# Patient Record
Sex: Male | Born: 1965
Health system: Southern US, Community
[De-identification: ages and names within clinical notes are randomized; demographics above are authoritative.]

## PROBLEM LIST (undated history)

## (undated) DIAGNOSIS — E291 Testicular hypofunction: Secondary | ICD-10-CM

## (undated) DIAGNOSIS — E119 Type 2 diabetes mellitus without complications: Secondary | ICD-10-CM

## (undated) DIAGNOSIS — J45909 Unspecified asthma, uncomplicated: Secondary | ICD-10-CM

## (undated) DIAGNOSIS — E669 Obesity, unspecified: Secondary | ICD-10-CM

## (undated) DIAGNOSIS — K760 Fatty (change of) liver, not elsewhere classified: Secondary | ICD-10-CM

## (undated) DIAGNOSIS — M109 Gout, unspecified: Secondary | ICD-10-CM

## (undated) DIAGNOSIS — I10 Essential (primary) hypertension: Secondary | ICD-10-CM

## (undated) DIAGNOSIS — E785 Hyperlipidemia, unspecified: Secondary | ICD-10-CM

## (undated) DIAGNOSIS — K219 Gastro-esophageal reflux disease without esophagitis: Secondary | ICD-10-CM

## (undated) DIAGNOSIS — I872 Venous insufficiency (chronic) (peripheral): Secondary | ICD-10-CM

## (undated) HISTORY — DX: Essential (primary) hypertension: I10

## (undated) HISTORY — DX: Obesity, unspecified: E66.9

## (undated) HISTORY — DX: Gastro-esophageal reflux disease without esophagitis: K21.9

## (undated) HISTORY — PX: KNEE ARTHROSCOPY: SHX127

## (undated) HISTORY — DX: Type 2 diabetes mellitus without complications: E11.9

## (undated) HISTORY — DX: Hyperlipidemia, unspecified: E78.5

## (undated) HISTORY — PX: ESOPHAGOGASTRODUODENOSCOPY: SHX1529

## (undated) HISTORY — DX: Venous insufficiency (chronic) (peripheral): I87.2

## (undated) HISTORY — DX: Gout, unspecified: M10.9

## (undated) HISTORY — DX: Fatty (change of) liver, not elsewhere classified: K76.0

## (undated) HISTORY — DX: Testicular hypofunction: E29.1

## (undated) HISTORY — DX: Unspecified asthma, uncomplicated: J45.909

---

## 1991-08-05 HISTORY — PX: CYSTOSCOPY: SUR368

## 1995-08-08 LAB — HM COLONOSCOPY: HM Colonoscopy: NEGATIVE

## 1999-08-05 HISTORY — PX: ANTERIOR CRUCIATE LIGAMENT REPAIR: SHX115

## 2000-11-29 ENCOUNTER — Encounter: Payer: Self-pay | Admitting: *Deleted

## 2000-11-29 ENCOUNTER — Inpatient Hospital Stay (HOSPITAL_COMMUNITY): Admission: EM | Admit: 2000-11-29 | Discharge: 2000-12-01 | Payer: Self-pay | Admitting: Emergency Medicine

## 2001-04-21 ENCOUNTER — Inpatient Hospital Stay (HOSPITAL_COMMUNITY): Admission: EM | Admit: 2001-04-21 | Discharge: 2001-04-25 | Payer: Self-pay | Admitting: *Deleted

## 2001-04-21 ENCOUNTER — Encounter: Payer: Self-pay | Admitting: *Deleted

## 2001-04-22 ENCOUNTER — Encounter: Payer: Self-pay | Admitting: *Deleted

## 2001-04-23 ENCOUNTER — Encounter: Payer: Self-pay | Admitting: *Deleted

## 2001-04-24 ENCOUNTER — Encounter: Payer: Self-pay | Admitting: *Deleted

## 2001-08-18 ENCOUNTER — Encounter: Payer: Self-pay | Admitting: Internal Medicine

## 2001-08-18 ENCOUNTER — Ambulatory Visit (HOSPITAL_COMMUNITY): Admission: RE | Admit: 2001-08-18 | Discharge: 2001-08-18 | Payer: Self-pay | Admitting: Internal Medicine

## 2001-10-12 ENCOUNTER — Ambulatory Visit (HOSPITAL_COMMUNITY): Admission: RE | Admit: 2001-10-12 | Discharge: 2001-10-12 | Payer: Self-pay | Admitting: Internal Medicine

## 2001-10-12 ENCOUNTER — Encounter: Payer: Self-pay | Admitting: Internal Medicine

## 2007-11-03 ENCOUNTER — Emergency Department (HOSPITAL_COMMUNITY): Admission: EM | Admit: 2007-11-03 | Discharge: 2007-11-03 | Payer: Self-pay | Admitting: Emergency Medicine

## 2010-12-20 NOTE — Discharge Summary (Signed)
[place]. [facility]  Patient:    Shaun [name], Shaun [name] Visit Number: [id number] MRN: [id number]          Service Type: MED Location: [phone] Attending Physician:  Glennon Hamilton Dictated by:   Abelino Derrick, P.A.C. [facility] Admit Date:  04/21/2001 Discharge Date: 04/25/2001                             Discharge Summary  HOSPITAL COURSE:  Mr. Geiger is a 45 year old male admitted with chest pain April 22, 2001. His EKG showed some nonspecific changes.  He was admitted to telemetry.  Enzymes were negative.  Cardiolite study was negative for ischemia with an EF of 63%.  Spiral CT was nondiagnostic.  Followup was recommended.  The patient did have a followup spiral CT that again was not diagnostic, and a VQ scan was recommended.  The patient did develop a fever of September 20 and as placed on Z-Pak.  VQ scan showed no pulmonary emboli but basilar opacities consistent with an infectious process.  In the interim, one of his blood cultures came back positive for staph.  He as seen in consult by Dr. Burnice Logan who felt that his was probably a contaminant.  Plan for now is to send him home on Zithromax 250 mg for 3 days.  DISCHARGE MEDICATIONS: 1. Zithromax 250 mg x 3 days. 2. Prevacid 30 mg a day. 3. Tenormin 50 mg a day. 4. Calan 180 mg a day. 5. Allopurinol 300 mg a day. 6. Zoloft 25 mg a day.  LABORATORY DATA:  Urinalysis unremarkable.  Urine culture negative.  Liver function test normal.  Amylase normal at 44.  Lipase normal at 31.  White count 7.7, hemoglobin 15.4, hematocrit 43.8, platelets 173.  D-dimer 0.22. Sodium 140, potassium 4.2, BUN 8, creatinine 0.9.  CK and troponin negative x 2.  Lipid profile shows cholesterol 145, HDL 23, LDL 49.  EKG shows sinus rhythm without acute changes.  DISPOSITION:  The patient is discharged in stable condition and will follow up with his family doctor. Dictated by:   Abelino Derrick, P.A.C. [facility] Attending  Physician:  Glennon Hamilton DD:  04/25/01 TD:  04/25/01 Job: [id number]

## 2010-12-20 NOTE — H&P (Signed)
[place]. [facility]  Patient:    TYCHO, CHERAMIE                      MRN: [id number] Adm. Date:  [id number] Disc. Date: [id number] Attending:  Nadean Corwin                         History and Physical  REDICTATION  CHIEF COMPLAINT:  Diarrhea with hematochezia and abdominal pain.  HISTORY OF PRESENT ILLNESS:  This is a pleasant, 45 year old, white male, patient of Dr. Marlowe Shores, who presented to the emergency room with his wife and family with a three-week history of diarrhea and acute history of hematochezia and abdominal pain.  Over the past two weeks he has had a viral "flu" with nausea, vomiting but mostly diarrhea.  It was actually improving but tended to wax and wane until yesterday when the cramping and pain acutely worsened.  Last night, he developed melena and this morning he had a large amount of bright red blood per rectum and clots.  He denied any other complaints, no fevers, chills, cough, cold symptoms, no vomiting, just nausea. He denied any similar episodes in the past.  He has had some mild cough productive of yellow phlegm and is a smoker.  PAST MEDICAL HISTORY:  Gout, hypertension, migraines, history of bilateral knee problems (Dr. Chaney Malling).  MEDICATIONS: 1. Allopurinol 100 mg p.o. q.d. 2. Atenolol 100 mg p.o. q.d. 3. Metoclopramide p.r.n. headache. 4. Aleve p.r.n. 5. Sulindac 200 mg p.o. b.i.d. 6. Albuterol p.r.n.  ALLERGIES:  No known drug allergies.  SOCIAL HISTORY:  Smoker, occasional drinker socially.  He works full-time with a [employer].  Married with children.  FAMILY HISTORY:  Unremarkable.  PHYSICAL EXAMINATION:  VITAL SIGNS:  Afebrile at 98.7, 96% on room air, respirations unlabored at 22, pulse 66 and regular, blood pressure 145/70.  GENERAL:  The patient is not in acute distress.  HEENT:  ______ AT, EOMI.  PERRLA.  Clear posterior pharynx.  Normal TMs.  NECK:  Supple without adenopathy,  JVD, thyromegaly, or bruits.  Full range of motion.  CHEST:  Course breath sounds with few bronchial wheezes without rhonchi or rales.  CARDIOVASCULAR:  Regular rate without murmur, gallop, or rub.  ABDOMEN:  Soft, nondistended, mild diffuse tenderness without localization. No hepatosplenomegaly or masses.  No rebound or guarding.  EXTREMITIES:  Without clubbing, cyanosis, or edema.  RECTAL:  Deferred because guaiac-positive on previous examination.  LABORATORY DATA:  White count 9.5, hemoglobin 14.9, platelets 226.  Urinalysis normal.  BMET, type and cross and lipase pending on admission.  ASSESSMENT AND PLAN:  This is a pleasant 45 year old, white male, smoker without prior gastrointestinal disorders, who presents acutely with a gastrointestinal bleeding following a probable viral gastroenteritis.  Will admit to Dr. Oneta Rack and consult Dr. Virginia Rochester for consideration of endoscopy and colonoscopy to rule out a ulceration, or lower gastrointestinal bleed.  No obvious hemorrhoids on examination. 1. Gastrointestinal bleed:  Will consult Dr. Virginia Rochester as described above.  Will    place the patient on intravenous Protonix and p.r.n. Phenergan.  Will    follow b.i.d. CBCs to see if his hemoglobin drops and check type and    cross and keep fluids going.  Make sure he has two IVs in case he bleeds    out further.  Likely the hemoglobin so far have been just delusional.    There has not been  a significant bleed at this point.  Hopefully, this    will all be related to a viral gastroenteritis or an ulceration due to    his history of smoking and the NSAID use for his knee pain.  Will obtain    an abdominal CT with contrast to rule out diverticular disease or abscess    in light of his complaints as well. 2. Abdominal pain as above. 3. Question asthma versus bronchitis.  As a smoker he may have chronic    obstructive pulmonary disease or some mild asthma.  Will obtain a    chest x-ray and evaluate to  see if he has any long-standing disease or    a acute bronchitis.  P.R.N. albuterol. 4. Hypertension:  Stable.  Continue his current medications.  Question use of    atenolol if he has a history of asthma or has developed asthma. 5. Migraines:  Stable. 6. Gout:  Stable. DD:  03/13/01 TD:  03/14/01 Job: [id number]

## 2010-12-20 NOTE — Procedures (Signed)
[place]. [facility]  Patient:    Shaun [name], Shaun [name]                      MRN: [id number] Adm. Date:  [id number] Attending:  Ammie Dalton CC:         Marinus Maw, M.D.   Procedure Report  PROCEDURE:  Upper endoscopy.  INDICATIONS:  This 45 year old gentleman was admitted with two weeks of diarrhea, nausea, upper respiratory infection and passing melenic stools; the hemoglobin, however, was 14 g but stool was Hemoccult-positive.  He has been on NSAIDs for gout.  He is now undergoing upper endoscopy for evaluation of upper GI bleed.  ENDOSCOPIST:  Hedwig Morton. Juanda Chance, M.D.  ENDOSCOPE:  Fujinon single-channel videoscope.  SEDATION:  Versed 10 mg IV, Fentanyl 50 mcg IV.  FINDINGS:  Fujinon single-channel videoscope passed under direct vision through the posterior pharynx into esophagus.  Patient was monitored by pulse oximetry; his oxygen saturations were normal.  Patient was very anxious and combative throughout the procedure.  Gag reflex was present.  Proximal and midesophagus were coated with dyclocaine spray.  In the distal esophagus at the GE junction, after irrigation with water, there were several linear erosions.  None of them were longer than 5 mm.  These changes were consistent with grade 1 reflux esophagitis.  There was no evidence of stricture or hiatal hernia.  Stomach:  Stomach was insufflated with air.  It was free of any blood or food. Gastric folds were normal in the body of the stomach.  In the gastric antrum, there were multiple shallow erosions with mucosal hemorrhages.  No exudate and no deep ulcers.  Pyloric outlet was normal.  Retroflexion of endoscope revealed normal fundus and cardia.  Biopsies were taken from gastric antrum for CLOtest.  Duodenum:  Duodenal bulb showed multiple abrasions and erosions consistent with mild-to-moderate duodenitis.  No active bleeding was noted.  Descending duodenum was normal.  The duodenal  bulb itself did not appear to be deformed.  IMPRESSION: 1. Grade 1 esophagitis. 2. Antral erosive gastritis, status post CLOtest. 3. Duodenitis. 4. No active bleeding.  PLAN: 1. He may be able to advance diet. 2. Control cough which may be aggravating his reflux and esophagitis. 3. Follow hemoglobin and hematocrit. 4. Protonix 40 mg p.o. b.i.d. DD:  11/30/00 TD:  12/01/00 Job: [id number]

## 2010-12-20 NOTE — Discharge Summary (Signed)
Shaun [name]. Shaun [name]  Patient:    Shaun, [name]                      MRN: [id number] Adm. Date:  [id number] Disc. Date: [id number] Attending:  Nadean Corwin CC:         Ammie Dalton, M.D.  Sabino Gasser, M.D.  Hedwig Morton. Juanda Chance, M.D. [facility]   Discharge Summary  DISCHARGE DIAGNOSES: 1. Acute gastrointestinal bleeding attributed to esophagogastroduodenitis. 2. Asthmatic tracheitis. 3. Diarrheal illness, rule out infectious versus ileocolitis. 4. Hypertension. 5. Gout, history of. 6. Migraine, history of.  PROCEDURE:  Upper endoscopy on November 30, 2000, by Dr. Lina Sar.  COMPLICATIONS:  None.  CONSULTATIONS: 1. Dr. Lina Sar. 2. Dr. Sabino Gasser.  HISTORY OF PRESENT ILLNESS:  The patient is a very nice 45 year old, married, white male with a history of hypertension, migraine and gout who presented to the emergency room with complaints of diarrhea, dyspepsia and abdominal pain and cramping with question of melena.  Apparently, the patient reported history also of hematochezia and came to the emergency room and had initial CBC testing showing hemoglobin 14.9 g percent.  Hemoglobin was rechecked in the emergency room and initially thought to have dropped to 11 g range which was ultimately felt due to error with followup CBC showing hemoglobin stable. The patient was admitted for further evaluation.  For details, see admission note per Dr. Theda Belfast.  HOSPITAL COURSE:  The patient had initial consultation by Dr. Sabino Gasser for GI evaluation.  The patient was begun on IV fluids and the morning following admission had EGD which showed evidence of esophagitis, antral gastritis and duodenitis.  The patient was recommended to continue on proton pump inhibitors and monitor for lower GI bleeding and further advised to avoid NSAIDs.  The patient remained stable over the initial subsequent hospital course and was in addition given nebulized  bronchodilator treatments for persistent coughing which was felt allergic and irritant in etiology with the patient relating having discontinued smoking in the last weeks or months.  Hemoglobins were monitored and remained stable.  Stool studies were sent for further evaluation.  This patients conditions remained stable and he was felt significantly improved over the initial hospital course.  Arrangements were made for discharge to follow up as an outpatient.  The patient reported an exacerbation of migraine on the morning of the second hospital day for which he was treated with his home medications including Cafergot and Reglan complemented with p.r.n. Vicodin.  Accessory clinical findings include initial hemoglobin measuring 14.9 g percent and repeated at 14.9 and 15.1 g percent with WBCs normal as was WBC differential.  CMP on admission showed no significant abnormalities with followup BMP day postadmission within normal limits range.  Serum lipase measured 12, lower than normal range.  Urinalysis showed concentrated urine with specific gravity 1.031 and otherwise negative.  Stool specimens were sent for further cultures with results pending at time of discharge.  DISPOSITION:  The patient was discharged home.  DISCHARGE MEDICATIONS: 1. Given prescription changing his preadmission Atenolol to Verapamil 240 mg    SR taken daily with food because of his wheezing. 2. Prevacid 30 mg daily. 3. Colchicine 0.6 mg daily. 4. Advair 250 mg/50 diskus inhalation b.i.d. 5. Histussin HC cough expectorant. 6. Vicodin tablets for pain or headache. 7. Continue Allopurinol as previous.  SPECIAL INSTRUCTIONS:  Reiterated importance of avoiding aspirin, NSAIDs and discontinuing his Atenolol.  Do not resume smoking.  The  patient is advised to remain out of work for several more days and increase diet and activity as tolerated.  DIET:  The patient was further advised in detail of antidyspeptic,  antireflux type diet.  FOLLOWUP:  Schedule followup in the office one week postdischarge.  CONDITION ON DISCHARGE:  Stable and improved.  Prognosis good. DD:  12/01/00 TD:  12/02/00 Job: [id number]

## 2013-02-09 ENCOUNTER — Other Ambulatory Visit (HOSPITAL_COMMUNITY): Payer: Self-pay | Admitting: Internal Medicine

## 2013-02-09 ENCOUNTER — Ambulatory Visit (HOSPITAL_COMMUNITY)
Admission: RE | Admit: 2013-02-09 | Discharge: 2013-02-09 | Disposition: A | Discharge status: Home / Self Care | Payer: BC Managed Care – PPO | Source: Ambulatory Visit | Attending: Internal Medicine | Admitting: Internal Medicine

## 2013-02-09 DIAGNOSIS — I10 Essential (primary) hypertension: Secondary | ICD-10-CM

## 2013-08-07 ENCOUNTER — Encounter: Payer: Self-pay | Admitting: Physician Assistant

## 2013-08-07 DIAGNOSIS — E349 Endocrine disorder, unspecified: Secondary | ICD-10-CM | POA: Insufficient documentation

## 2013-08-07 DIAGNOSIS — Z79899 Other long term (current) drug therapy: Secondary | ICD-10-CM | POA: Insufficient documentation

## 2013-08-07 DIAGNOSIS — J45909 Unspecified asthma, uncomplicated: Secondary | ICD-10-CM | POA: Insufficient documentation

## 2013-08-07 DIAGNOSIS — M1000 Idiopathic gout, unspecified site: Secondary | ICD-10-CM | POA: Insufficient documentation

## 2013-08-07 DIAGNOSIS — I10 Essential (primary) hypertension: Secondary | ICD-10-CM | POA: Insufficient documentation

## 2013-08-07 DIAGNOSIS — K2100 Gastro-esophageal reflux disease with esophagitis, without bleeding: Secondary | ICD-10-CM | POA: Insufficient documentation

## 2013-08-07 DIAGNOSIS — M109 Gout, unspecified: Secondary | ICD-10-CM

## 2013-08-07 DIAGNOSIS — E785 Hyperlipidemia, unspecified: Secondary | ICD-10-CM

## 2013-08-07 DIAGNOSIS — E291 Testicular hypofunction: Secondary | ICD-10-CM

## 2013-08-07 DIAGNOSIS — E119 Type 2 diabetes mellitus without complications: Secondary | ICD-10-CM | POA: Insufficient documentation

## 2013-08-07 DIAGNOSIS — E782 Mixed hyperlipidemia: Secondary | ICD-10-CM | POA: Insufficient documentation

## 2013-08-07 DIAGNOSIS — K219 Gastro-esophageal reflux disease without esophagitis: Secondary | ICD-10-CM

## 2013-08-08 ENCOUNTER — Encounter: Payer: Self-pay | Admitting: Physician Assistant

## 2013-08-08 ENCOUNTER — Ambulatory Visit (INDEPENDENT_AMBULATORY_CARE_PROVIDER_SITE_OTHER): Payer: BC Managed Care – PPO | Admitting: Physician Assistant

## 2013-08-08 VITALS — BP 138/88 | HR 84 | Temp 98.1°F | Resp 16 | Ht 71.0 in | Wt 225.0 lb

## 2013-08-08 DIAGNOSIS — L0291 Cutaneous abscess, unspecified: Secondary | ICD-10-CM

## 2013-08-08 DIAGNOSIS — L0390 Cellulitis, unspecified: Secondary | ICD-10-CM

## 2013-08-08 DIAGNOSIS — I809 Phlebitis and thrombophlebitis of unspecified site: Secondary | ICD-10-CM

## 2013-08-08 DIAGNOSIS — I82409 Acute embolism and thrombosis of unspecified deep veins of unspecified lower extremity: Secondary | ICD-10-CM

## 2013-08-08 DIAGNOSIS — I82401 Acute embolism and thrombosis of unspecified deep veins of right lower extremity: Secondary | ICD-10-CM

## 2013-08-08 MED ORDER — CIPROFLOXACIN HCL 500 MG PO TABS: 500.0000 mg | ORAL_TABLET | Freq: Two times a day (BID) | ORAL | Status: AC

## 2013-08-08 NOTE — Patient Instructions (Signed)
Phlebitis  Phlebitis is a redness, tenderness and soreness (inflammation) in a vein. This can occur in your arms, legs, or torso (trunk), as well as deeper inside your body.   CAUSES   Phlebitis can be triggered by multiple factors. These include:   Reduced (restricted) blood flow through your veins. This happens with prolonged bed rest, long distance travel, injury or surgery. Being overweight (obese) and pregnant can also restrict blood flow and lead to phlebitis.   Putting a catheter in the vein (intravenous or IV) and giving certain medications through in the vein (intravenously).   Cancer and cancer treatment.   Use of illegal intravenous drugs.   Inflammatory diseases.   Inherited (genetic) diseases that increase the risk for blood clots.   Hormone therapy (such as birth control pills).  SYMPTOMS    Red, tender, swollen, painful area on your skin.   Usually, the area will be long and narrow.   Low grade fever.   Significant firmness along the center of this area. This can indicate that a blood clot has formed.   Surrounding redness or a high fever, which can indicate an infection (cellulitis).  DIAGNOSIS    The appearance of your condition and your symptoms will cause your caregiver to suspect phlebitis. Usually, this is enough for a diagnosis.   Your caregiver may request blood tests or an ultrasound test of the area to be sure you do not have an infection or a blood clot. Blood tests and discussing your family history may also indicate if you have an underlying genetic disease that causes blood clots.   Occasionally, a piece of tissue is taken from the body (biopsy) if an unusual cause of phlebitis is suspected.  TREATMENT    Raise (elevate) the affected area above the level of the heart.   Apply a warm compress or heating pad for 20 minutes, 3 or 4 times a day. If you use an electric heating pad, follow the directions so you do not burn yourself.   Anti-inflammatory medications are usually  recommended. Follow your caregiver's directions.   Any IV catheter, if present, will be removed by your caregiver.   Your caregiver may prescribe medicines that kill germs (antibiotics) if an infection is present.   Your caregiver may recommend blood thinners if a blood clot is suspected or present.   Support stockings or bandages may be helpful, depending on the cause and location of the phlebitis.   Surgery may be needed to remove very damaged sections of vein, but this is rare.  HOME CARE INSTRUCTIONS    Take medications exactly as prescribed.   Follow up with your caregiver as directed.   Use support stockings or bandages if advised. These will speed healing and prevent recurrence.   If you are on blood thinners:   Do follow-up blood tests exactly as directed.   Check with your caregiver before using any new medications.   Wear a pendant to show that you are on blood thinners.   For phlebitis in the legs:   Avoid prolonged standing or bed rest.   Keep your legs moving. Raise your legs with sitting or lying.   Do not smoke.   Women, particularly those over the age of 35, should consider the risks and benefits of taking the contraceptive pill. This kind of hormone treatment can increase your risk for blood clots.  SEEK MEDICAL CARE IF:    You have unusual bruising or any bleeding problems.   Swelling   or pain in your affected arm or leg is not gradually improving.   You are on anti-inflammatory medication and you develop belly (abdominal) pain.  SEEK IMMEDIATE MEDICAL CARE IF:    An unexplained oral temperature above 100.5 F (38.1 C) develops.   You have sudden onset of chest pain or difficulty breathing.  Document Released: 07/15/2001 Document Revised: 10/13/2011 Document Reviewed: 04/16/2009  [employer] Patient Information 2014 [employer].

## 2013-08-08 NOTE — Progress Notes (Signed)
   Subjective:    Patient ID: Shaun [name], male    DOB: 1965-12-14, 48 y.o.   MRN: [id number]  Rash This is a new problem. The current episode started yesterday. The affected locations include the right lower leg and right ankle. The rash is characterized by bruising, pain and burning. Associated with: Nail through his shoe 2 weeks ago. Pertinent negatives include no shortness of breath. Past treatments include nothing.   No recent travel, no injury, no cancer, no personal history of DVT or family history of DVT.    Review of Systems  Constitutional: Negative.   HENT: Negative.   Respiratory: Negative for shortness of breath.   Skin: Positive for rash.       Objective:   Physical Exam  Constitutional: He is oriented to person, place, and time. He appears well-developed and well-nourished.  Neck: Normal range of motion. Neck supple.  Cardiovascular: Normal rate and regular rhythm.   Pulmonary/Chest: Effort normal and breath sounds normal.  Abdominal: Soft. Bowel sounds are normal.  Musculoskeletal: Normal range of motion.  Neurological: He is alert and oriented to person, place, and time.  Skin: Skin is warm and dry. Rash noted. There is erythema.  Right lower leg, just superior to the medial malleolous with non blanchable erythema/petichiea approx 3x5 inches with erythema up his leg to his posterior medial knee. There is a hardcord there and edema posterior.       Assessment & Plan:  Cellulitis VS DVT-  Low probability DVT will get Ddimer- if + get U/S- discussed possibility of it being positive due to cellulitis but patient prefers blood test Likely cellulitis with recent nail puncture through shoe- will get CBC and treat with Cipro to treat P. Aeruginosa.  Elevate foot, warm wet compresses

## 2013-08-09 LAB — CBC WITH DIFFERENTIAL/PLATELET
Basophils Absolute: 0 10*3/uL (ref 0.0–0.1)
Basophils Relative: 0 % (ref 0–1)
Eosinophils Absolute: 0.2 10*3/uL (ref 0.0–0.7)
Eosinophils Relative: 3 % (ref 0–5)
HCT: 46.1 % (ref 39.0–52.0)
Hemoglobin: 16.3 g/dL (ref 13.0–17.0)
Lymphocytes Relative: 46 % (ref 12–46)
Lymphs Abs: 2.4 10*3/uL (ref 0.7–4.0)
MCH: 28.7 pg (ref 26.0–34.0)
MCHC: 35.4 g/dL (ref 30.0–36.0)
MCV: 81.2 fL (ref 78.0–100.0)
Monocytes Absolute: 0.5 10*3/uL (ref 0.1–1.0)
Monocytes Relative: 9 % (ref 3–12)
Neutro Abs: 2.3 10*3/uL (ref 1.7–7.7)
Neutrophils Relative %: 42 % — ABNORMAL LOW (ref 43–77)
Platelets: 214 10*3/uL (ref 150–400)
RBC: 5.68 MIL/uL (ref 4.22–5.81)
RDW: 13.3 % (ref 11.5–15.5)
WBC: 5.3 10*3/uL (ref 4.0–10.5)

## 2013-08-09 LAB — D-DIMER, QUANTITATIVE: D-Dimer, Quant: 0.24 ug/mL-FEU (ref 0.00–0.48)

## 2013-08-10 ENCOUNTER — Ambulatory Visit: Payer: Self-pay | Admitting: Internal Medicine

## 2013-08-17 ENCOUNTER — Ambulatory Visit: Payer: Self-pay | Admitting: Physician Assistant

## 2013-08-18 ENCOUNTER — Encounter: Payer: Self-pay | Admitting: Physician Assistant

## 2013-08-18 ENCOUNTER — Ambulatory Visit (INDEPENDENT_AMBULATORY_CARE_PROVIDER_SITE_OTHER): Payer: BC Managed Care – PPO | Admitting: Physician Assistant

## 2013-08-18 VITALS — BP 140/88 | HR 84 | Temp 97.7°F | Resp 16 | Wt 230.0 lb

## 2013-08-18 DIAGNOSIS — E119 Type 2 diabetes mellitus without complications: Secondary | ICD-10-CM

## 2013-08-18 DIAGNOSIS — I10 Essential (primary) hypertension: Secondary | ICD-10-CM

## 2013-08-18 DIAGNOSIS — Z79899 Other long term (current) drug therapy: Secondary | ICD-10-CM

## 2013-08-18 DIAGNOSIS — E782 Mixed hyperlipidemia: Secondary | ICD-10-CM

## 2013-08-18 DIAGNOSIS — E785 Hyperlipidemia, unspecified: Secondary | ICD-10-CM

## 2013-08-18 DIAGNOSIS — K219 Gastro-esophageal reflux disease without esophagitis: Secondary | ICD-10-CM

## 2013-08-18 DIAGNOSIS — E559 Vitamin D deficiency, unspecified: Secondary | ICD-10-CM

## 2013-08-18 DIAGNOSIS — R7309 Other abnormal glucose: Secondary | ICD-10-CM

## 2013-08-18 LAB — CBC WITH DIFFERENTIAL/PLATELET
Basophils Absolute: 0 10*3/uL (ref 0.0–0.1)
Basophils Relative: 1 % (ref 0–1)
Eosinophils Absolute: 0.2 10*3/uL (ref 0.0–0.7)
Eosinophils Relative: 3 % (ref 0–5)
HCT: 45.8 % (ref 39.0–52.0)
Hemoglobin: 16.4 g/dL (ref 13.0–17.0)
Lymphocytes Relative: 52 % — ABNORMAL HIGH (ref 12–46)
Lymphs Abs: 3.1 10*3/uL (ref 0.7–4.0)
MCH: 29 pg (ref 26.0–34.0)
MCHC: 35.8 g/dL (ref 30.0–36.0)
MCV: 80.9 fL (ref 78.0–100.0)
Monocytes Absolute: 0.4 10*3/uL (ref 0.1–1.0)
Monocytes Relative: 6 % (ref 3–12)
Neutro Abs: 2.2 10*3/uL (ref 1.7–7.7)
Neutrophils Relative %: 38 % — ABNORMAL LOW (ref 43–77)
Platelets: 207 10*3/uL (ref 150–400)
RBC: 5.66 MIL/uL (ref 4.22–5.81)
RDW: 13.9 % (ref 11.5–15.5)
WBC: 5.9 10*3/uL (ref 4.0–10.5)

## 2013-08-18 LAB — HEMOGLOBIN A1C
Hgb A1c MFr Bld: 11.5 % — ABNORMAL HIGH (ref <5.7)
Mean Plasma Glucose: 283 mg/dL — ABNORMAL HIGH (ref <117)

## 2013-08-18 MED ORDER — LISINOPRIL 40 MG PO TABS: ORAL_TABLET | ORAL | Status: DC

## 2013-08-18 MED ORDER — CANAGLIFLOZIN 300 MG PO TABS: ORAL_TABLET | ORAL | Status: DC

## 2013-08-18 NOTE — Patient Instructions (Addendum)
ACE inhibitors are blood pressure medications that protect your heart and kidneys. It can cause two symptoms: The most common symptom is a dry cough/tickle in your throat that can happen the first day you take it or 5 years after you have been taking it. Please call us if you have this and we can switch it to a different medications. The least common side effect is called angioedema which is swelling of your lips and tongue and can cause problems with your breathing. This is a very very rare side effect but very serious. If this happens please stop the medication and go to the ER.   Try the invokana once daily. We can combine this with your metformin if you do well. This can make you pee more.   Please get a pedometer to check at work how much you are walking- goal is 10,000 steps daily.     Bad carbs also include fruit juice, alcohol, and sweet tea. These are empty calories that do not signal to your brain that you are full.   Please remember the good carbs are still carbs which convert into sugar. So please measure them out no more than 1/2-1 cup of rice, oatmeal, pasta, and beans.  Veggies are however free foods! Pile them on.   I like lean protein at every meal such as chicken, [place]urkey, pork chops, cottage cheese, etc. Just do not fry these meats and please center your meal around vegetable, the meats should be a side dish.   No all fruit is created equal. Please see the list below, the fruit at the bottom is higher in sugars than the fruit at the top

## 2013-08-18 NOTE — Progress Notes (Signed)
HPI Patient presents for 3 month follow up with hypertension, hyperlipidemia, diabetes and vitamin D. Patient's blood pressure has been controlled at home, today their BP is BP: 140/88 mmHg Patient denies chest pain, shortness of breath, dizziness. He is diabetic and not on an ARB or ACE.  Patient's cholesterol is diet controlled.The cholesterol last visit was LDL is 91, trigs is 345, HDL is 26.  The patient has not been working on diet and exercise for Diabetes, and denies changes in vision, polys, and paresthesias. Sugar is not well controlled.  HTN- BP is elevated right now, states it was from rushing here. Does not check it at home.  He still has 3-4 days of the Cipro left and states his leg is much better. No erythema, tenderness, the knot is better. Still with more swelling in right leg than the left leg.  Patient is on Vitamin D supplement.   Current Medications:  Current Outpatient Prescriptions on File Prior to Visit  Medication Sig Dispense Refill  . atenolol (TENORMIN) 100 MG tablet Take 100 mg by mouth daily.      . cholecalciferol (VITAMIN D) 1000 UNITS tablet Take 4,000 Units by mouth daily.      . ciprofloxacin (CIPRO) 500 MG tablet Take 1 tablet (500 mg total) by mouth 2 (two) times daily.  28 tablet  0  . fenofibrate micronized (LOFIBRA) 134 MG capsule Take 134 mg by mouth daily before breakfast.      . glyBURIDE (DIABETA) 5 MG tablet Take 5 mg by mouth 3 (three) times daily with meals.      . metFORMIN (GLUCOPHAGE-XR) 500 MG 24 hr tablet Take 500 mg by mouth daily with breakfast. 2 pills twice daily with food      . Multiple Vitamins-Minerals (MULTIVITAL PO) Take by mouth.       No current facility-administered medications on file prior to visit.   Medical History:  Past Medical History  Diagnosis Date  . Hypertension   . Hyperlipidemia   . Diabetes mellitus without complication   . GERD (gastroesophageal reflux disease)   . Asthma   . Gout   . Fatty liver disease,  nonalcoholic   . Obesity   . Venous insufficiency   . Hypogonadism male    Allergies:  Allergies  Allergen Reactions  . Zocor [Simvastatin]     Headache  . Zoloft [Sertraline Hcl]     dysphona    ROS Constitutional: Denies fever, chills, headaches, insomnia, fatigue, night sweats Eyes: Denies redness, blurred vision, diplopia, discharge, itchy, watery eyes.  ENT: Denies congestion, post nasal drip, sore throat, earache, dental pain, Tinnitus, Vertigo, Sinus pain, snoring.  Cardio: Denies chest pain, palpitations, irregular heartbeat, dyspnea, diaphoresis, orthopnea, PND, claudication, edema Respiratory: denies cough, shortness of breath, wheezing.  Gastrointestinal: Denies dysphagia, heartburn, AB pain/ cramps, N/V, diarrhea, constipation, hematemesis, melena, hematochezia,  hemorrhoids Genitourinary: Denies dysuria, frequency, urgency, nocturia, hesitancy, discharge, hematuria, flank pain Musculoskeletal: Denies myalgia, stiffness, pain, swelling and strain/sprain. Skin: Denies pruritis, rash, changing in skin lesion Neuro: Denies Weakness, tremor, incoordination, spasms, pain Psychiatric: Denies confusion, memory loss, sensory loss Endocrine: Denies change in weight, skin, hair change, nocturia Diabetic Polys, Denies visual blurring, hyper /hypo glycemic episodes, and paresthesia, Heme/Lymph: Denies Excessive bleeding, bruising, enlarged lymph nodes  Family history- Review and unchanged Social history- Review and unchanged Physical Exam: Filed Vitals:   08/18/13 1600  BP: 140/88  Pulse: 84  Temp: 97.7 F (36.5 C)  Resp: 16   Filed Weights  08/18/13 1600  Weight: 230 lb (104.327 kg)   General Appearance: Well nourished, in no apparent distress. Eyes: PERRLA, EOMs, conjunctiva no swelling or erythema Sinuses: No Frontal/maxillary tenderness ENT/Mouth: Ext aud canals clear, TMs without erythema, bulging. No erythema, swelling, or exudate on post pharynx.  Tonsils not  swollen or erythematous. Hearing normal.  Neck: Supple, thyroid normal.  Respiratory: Respiratory effort normal, BS equal bilaterally without rales, rhonchi, wheezing or stridor.  Cardio: RRR with no MRGs. Brisk peripheral pulses 1+ edema on right leg >left leg Abdomen: Soft, + BS.  Non tender, no guarding, rebound, hernias, masses. Lymphatics: Non tender without lymphadenopathy.  Musculoskeletal: Full ROM, 5/5 strength, normal gait.  Skin: Warm, dry without rashes, lesions, ecchymosis.  Neuro: Cranial nerves intact. No cerebellar symptoms. Sensation intact.  Psych: Awake and oriented X 3, normal affect, Insight and Judgment appropriate.   Assessment and Plan:  Hypertension: Continue medication, monitor blood pressure at home.  Continue DASH diet. Add lisinopril due to kidney/heart protection. Monitor BP at home.  Cholesterol: Continue diet and exercise. Check cholesterol.  Diabetes-Continue diet and exercise. Check A1C. Not at goal, LONG discussion about consequences of DM, diet, and exercise. Add Invokana samples and follow up in one month.  Vitamin D Def- check level and continue medications.  Leg cellulitis- improved, finish ABX. Do not walk around bare foot with DM.   Continue diet and meds as discussed. Further disposition pending results of labs. Discussed med's effects and SE's.    Shaun [name] 4:21 PM

## 2013-08-18 NOTE — Progress Notes (Deleted)
   Subjective:    Patient ID: Shaun [name], male    DOB: Sep 26, 1965, 48 y.o.   MRN: [id number]  HPI HTN- BP is elevated right now, states it was from rushing here. Does not check it at home.  He still has 3-4 days of the Cipro left and states his leg is much better. No erythema, tenderness, the knot is better. Still with more swelling in right leg than the left leg.    Review of Systems     Objective:   Physical Exam        Assessment & Plan:

## 2013-08-19 LAB — LIPID PANEL
Cholesterol: 148 mg/dL (ref 0–200)
HDL: 24 mg/dL — ABNORMAL LOW (ref >39)
LDL Cholesterol: 44 mg/dL (ref 0–99)
Total CHOL/HDL Ratio: 6.2 Ratio
Triglycerides: 399 mg/dL — ABNORMAL HIGH (ref <150)
VLDL: 80 mg/dL — ABNORMAL HIGH (ref 0–40)

## 2013-08-19 LAB — HEPATIC FUNCTION PANEL
ALT: 17 U/L (ref 0–53)
AST: 11 U/L (ref 0–37)
Albumin: 4.3 g/dL (ref 3.5–5.2)
Alkaline Phosphatase: 88 U/L (ref 39–117)
Bilirubin, Direct: 0.1 mg/dL (ref 0.0–0.3)
Indirect Bilirubin: 0.5 mg/dL (ref 0.0–0.9)
Total Bilirubin: 0.6 mg/dL (ref 0.3–1.2)
Total Protein: 6.7 g/dL (ref 6.0–8.3)

## 2013-08-19 LAB — BASIC METABOLIC PANEL WITH GFR
BUN: 13 mg/dL (ref 6–23)
CO2: 26 mEq/L (ref 19–32)
Calcium: 9.6 mg/dL (ref 8.4–10.5)
Chloride: 100 mEq/L (ref 96–112)
Creat: 0.62 mg/dL (ref 0.50–1.35)
GFR, Est African American: >89 mL/min
GFR, Est Non African American: >89 mL/min
Glucose, Bld: 325 mg/dL — ABNORMAL HIGH (ref 70–99)
Potassium: 4.7 mEq/L (ref 3.5–5.3)
Sodium: 136 mEq/L (ref 135–145)

## 2013-08-19 LAB — TSH: TSH: 1.603 u[IU]/mL (ref 0.350–4.500)

## 2013-08-19 LAB — MAGNESIUM: Magnesium: 1.7 mg/dL (ref 1.5–2.5)

## 2013-08-19 LAB — VITAMIN D 25 HYDROXY (VIT D DEFICIENCY, FRACTURES): Vit D, 25-Hydroxy: 33 ng/mL (ref 30–89)

## 2013-08-23 ENCOUNTER — Encounter: Payer: Self-pay | Admitting: Physician Assistant

## 2013-09-26 ENCOUNTER — Other Ambulatory Visit: Payer: Self-pay | Admitting: Physician Assistant

## 2013-09-26 MED ORDER — CANAGLIFLOZIN 300 MG PO TABS: ORAL_TABLET | ORAL | Status: DC

## 2013-10-27 ENCOUNTER — Encounter: Payer: Self-pay | Admitting: Internal Medicine

## 2013-10-27 ENCOUNTER — Ambulatory Visit (INDEPENDENT_AMBULATORY_CARE_PROVIDER_SITE_OTHER): Payer: BC Managed Care – PPO | Admitting: Physician Assistant

## 2013-10-27 ENCOUNTER — Encounter: Payer: Self-pay | Admitting: Physician Assistant

## 2013-10-27 VITALS — BP 120/78 | HR 76 | Temp 98.2°F | Resp 16 | Wt 224.0 lb

## 2013-10-27 DIAGNOSIS — I10 Essential (primary) hypertension: Secondary | ICD-10-CM

## 2013-10-27 DIAGNOSIS — E119 Type 2 diabetes mellitus without complications: Secondary | ICD-10-CM

## 2013-10-27 MED ORDER — MELOXICAM 15 MG PO TABS: ORAL_TABLET | ORAL | Status: DC

## 2013-10-27 MED ORDER — HYDROCODONE-ACETAMINOPHEN 5-325 MG PO TABS: 1.0000 | ORAL_TABLET | Freq: Four times a day (QID) | ORAL | Status: DC | PRN

## 2013-10-27 NOTE — Patient Instructions (Addendum)
Try lyrica samples 75 mg 1-2 at night Try mobic 15mg  daily with food Can take hydrocodone 5/325 PRN    Bad carbs also include fruit juice, alcohol, and sweet tea. These are empty calories that do not signal to your brain that you are full.   Please remember the good carbs are still carbs which convert into sugar. So please measure them out no more than 1/2-1 cup of rice, oatmeal, pasta, and beans.  Veggies are however free foods! Pile them on.   I like lean protein at every meal such as chicken, [place]urkey, pork chops, cottage cheese, etc. Just do not fry these meats and please center your meal around vegetable, the meats should be a side dish.   No all fruit is created equal. Please see the list below, the fruit at the bottom is higher in sugars than the fruit at the top

## 2013-10-27 NOTE — Progress Notes (Signed)
   Subjective:    Patient ID: Shaun [name], male    DOB: November 11, 1965, 48 y.o.   MRN: [id number]  HPI 48 y.o. uncontrolled diabetic with history of elevated BP, just started on lisinopril and invokana for DM, presents with pain that has been going on for 4 days. He is fine during the day with no limitations but he goes to bed at 10 and every night at 12 he wakes up with a constant "thorbbing/agrevating" pain,  that starts mid lower back on the left, goes up his back, shoulder, neck, and to his left temple. It will wake him up. He denies CP, SOB, diaphoresis, weakness.   Has tried ibuprofen, heat, stretching without help. He states that touching the areas make it worse.   Review of Systems  Constitutional: Negative.  Negative for fever, chills and fatigue.  Respiratory: Negative.  Negative for cough, wheezing and stridor.   Cardiovascular: Negative.   Gastrointestinal: Negative.   Endocrine: Negative.   Genitourinary: Negative.   Musculoskeletal: Positive for back pain, myalgias and neck pain. Negative for arthralgias, gait problem, joint swelling and neck stiffness.  Skin: Negative.  Negative for rash.  Neurological: Positive for headaches. Negative for dizziness, tremors, seizures, syncope, facial asymmetry, speech difficulty, weakness, light-headedness and numbness.  Hematological: Negative.        Objective:   Physical Exam  Constitutional: He is oriented to person, place, and time. He appears well-developed and well-nourished.  HENT:  Head: Normocephalic and atraumatic.  Eyes: Conjunctivae and EOM are normal. Pupils are equal, round, and reactive to light.  Neck: Normal range of motion. Neck supple. No JVD present. No thyromegaly present.  Cardiovascular: Normal rate, regular rhythm and normal heart sounds.   No murmur heard. Pulmonary/Chest: Effort normal and breath sounds normal. He has no wheezes. He exhibits no tenderness.  Abdominal: Soft. Bowel sounds are normal. He exhibits  no distension. There is no tenderness.  obese  Musculoskeletal: Normal range of motion. He exhibits tenderness (along left neck, left sholdder pain). He exhibits no edema.  Lymphadenopathy:    He has no cervical adenopathy.  Neurological: He is alert and oriented to person, place, and time.  Skin: Skin is warm and dry.       Assessment & Plan:  Abnormal pain- does not follow a nerve distribution, only at night with nothing making it better and touch making it worse? If fibromyalgia. Try gabepentin samples, amitripylene samples, mobic and norco given. Follow up in 1-2 week.  Check labs- start lisinopril and invokana- check CBC, BMP, LFTs

## 2013-10-28 LAB — HEPATIC FUNCTION PANEL
ALT: 14 U/L (ref 0–53)
AST: 11 U/L (ref 0–37)
Albumin: 4.4 g/dL (ref 3.5–5.2)
Alkaline Phosphatase: 69 U/L (ref 39–117)
Bilirubin, Direct: 0.1 mg/dL (ref 0.0–0.3)
Indirect Bilirubin: 0.4 mg/dL (ref 0.2–1.2)
Total Bilirubin: 0.5 mg/dL (ref 0.2–1.2)
Total Protein: 7.1 g/dL (ref 6.0–8.3)

## 2013-10-28 LAB — CBC WITH DIFFERENTIAL/PLATELET
Basophils Absolute: 0 10*3/uL (ref 0.0–0.1)
Basophils Relative: 0 % (ref 0–1)
Eosinophils Absolute: 0.2 10*3/uL (ref 0.0–0.7)
Eosinophils Relative: 3 % (ref 0–5)
HCT: 48.4 % (ref 39.0–52.0)
Hemoglobin: 17.2 g/dL — ABNORMAL HIGH (ref 13.0–17.0)
Lymphocytes Relative: 47 % — ABNORMAL HIGH (ref 12–46)
Lymphs Abs: 3.2 10*3/uL (ref 0.7–4.0)
MCH: 28.9 pg (ref 26.0–34.0)
MCHC: 35.5 g/dL (ref 30.0–36.0)
MCV: 81.3 fL (ref 78.0–100.0)
Monocytes Absolute: 0.4 10*3/uL (ref 0.1–1.0)
Monocytes Relative: 6 % (ref 3–12)
Neutro Abs: 3 10*3/uL (ref 1.7–7.7)
Neutrophils Relative %: 44 % (ref 43–77)
Platelets: 226 10*3/uL (ref 150–400)
RBC: 5.95 MIL/uL — ABNORMAL HIGH (ref 4.22–5.81)
RDW: 14.3 % (ref 11.5–15.5)
WBC: 6.8 10*3/uL (ref 4.0–10.5)

## 2013-10-28 LAB — BASIC METABOLIC PANEL WITH GFR
BUN: 13 mg/dL (ref 6–23)
CO2: 29 mEq/L (ref 19–32)
Calcium: 10.2 mg/dL (ref 8.4–10.5)
Chloride: 100 mEq/L (ref 96–112)
Creat: 0.69 mg/dL (ref 0.50–1.35)
GFR, Est African American: >89 mL/min
GFR, Est Non African American: >89 mL/min
Glucose, Bld: 234 mg/dL — ABNORMAL HIGH (ref 70–99)
Potassium: 4.9 mEq/L (ref 3.5–5.3)
Sodium: 135 mEq/L (ref 135–145)

## 2013-10-28 MED ORDER — SAXAGLIPTIN HCL 5 MG PO TABS: 5.0000 mg | ORAL_TABLET | Freq: Every day | ORAL | Status: DC

## 2013-10-28 NOTE — Addendum Note (Signed)
Addended by: Quentin MullingOLLIER, Joelene Barriere R on: 10/28/2013 08:12 AM   Modules accepted: Orders

## 2013-11-08 ENCOUNTER — Ambulatory Visit: Payer: Self-pay | Admitting: Internal Medicine

## 2013-11-16 ENCOUNTER — Ambulatory Visit (INDEPENDENT_AMBULATORY_CARE_PROVIDER_SITE_OTHER): Payer: BC Managed Care – PPO | Admitting: Physician Assistant

## 2013-11-16 ENCOUNTER — Encounter: Payer: Self-pay | Admitting: Physician Assistant

## 2013-11-16 ENCOUNTER — Ambulatory Visit (HOSPITAL_COMMUNITY)
Admission: RE | Admit: 2013-11-16 | Discharge: 2013-11-16 | Disposition: A | Discharge status: Home / Self Care | Payer: BC Managed Care – PPO | Source: Ambulatory Visit | Attending: Physician Assistant | Admitting: Physician Assistant

## 2013-11-16 VITALS — BP 138/88 | HR 76 | Temp 97.7°F | Resp 16 | Ht 71.0 in | Wt 226.0 lb

## 2013-11-16 DIAGNOSIS — R209 Unspecified disturbances of skin sensation: Secondary | ICD-10-CM | POA: Insufficient documentation

## 2013-11-16 DIAGNOSIS — M47814 Spondylosis without myelopathy or radiculopathy, thoracic region: Secondary | ICD-10-CM | POA: Insufficient documentation

## 2013-11-16 DIAGNOSIS — M4000 Postural kyphosis, site unspecified: Secondary | ICD-10-CM | POA: Insufficient documentation

## 2013-11-16 DIAGNOSIS — M542 Cervicalgia: Secondary | ICD-10-CM

## 2013-11-16 DIAGNOSIS — M25519 Pain in unspecified shoulder: Secondary | ICD-10-CM

## 2013-11-16 DIAGNOSIS — R200 Anesthesia of skin: Secondary | ICD-10-CM

## 2013-11-16 DIAGNOSIS — M25512 Pain in left shoulder: Secondary | ICD-10-CM

## 2013-11-16 DIAGNOSIS — R202 Paresthesia of skin: Secondary | ICD-10-CM

## 2013-11-16 MED ORDER — PREDNISONE 20 MG PO TABS: ORAL_TABLET | ORAL | Status: DC

## 2013-11-16 MED ORDER — GABAPENTIN 300 MG PO CAPS: ORAL_CAPSULE | ORAL | Status: DC

## 2013-11-16 MED ORDER — DEXAMETHASONE SODIUM PHOSPHATE 10 MG/ML IJ SOLN
10.0000 mg | Freq: Once | INTRAMUSCULAR | Status: AC
  Administered 2013-11-16: 10 mg via INTRAMUSCULAR

## 2013-11-16 NOTE — Patient Instructions (Signed)
Cervical Sprain A cervical sprain is an injury in the neck in which the strong, fibrous tissues (ligaments) that connect your neck bones stretch or tear. Cervical sprains can range from mild to severe. Severe cervical sprains can cause the neck vertebrae to be unstable. This can lead to damage of the spinal cord and can result in serious nervous system problems. The amount of time it takes for a cervical sprain to get better depends on the cause and extent of the injury. Most cervical sprains heal in 1 to 3 weeks. CAUSES  Severe cervical sprains may be caused by:   Contact sport injuries (such as from football, rugby, wrestling, hockey, auto racing, gymnastics, diving, martial arts, or boxing).   Motor vehicle collisions.   Whiplash injuries. This is an injury from a sudden forward-and backward whipping movement of the head and neck.  Falls.  Mild cervical sprains may be caused by:   Being in an awkward position, such as while cradling a telephone between your ear and shoulder.   Sitting in a chair that does not offer proper support.   Working at a poorly designed computer station.   Looking up or down for long periods of time.  SYMPTOMS   Pain, soreness, stiffness, or a burning sensation in the front, back, or sides of the neck. This discomfort may develop immediately after the injury or slowly, 24 hours or more after the injury.   Pain or tenderness directly in the middle of the back of the neck.   Shoulder or upper back pain.   Limited ability to move the neck.   Headache.   Dizziness.   Weakness, numbness, or tingling in the hands or arms.   Muscle spasms.   Difficulty swallowing or chewing.   Tenderness and swelling of the neck.  DIAGNOSIS  Most of the time your health care provider can diagnose a cervical sprain by taking your history and doing a physical exam. Your health care provider will ask about previous neck injuries and any known neck  problems, such as arthritis in the neck. X-rays may be taken to find out if there are any other problems, such as with the bones of the neck. Other tests, such as a CT scan or MRI, may also be needed.  TREATMENT  Treatment depends on the severity of the cervical sprain. Mild sprains can be treated with rest, keeping the neck in place (immobilization), and pain medicines. Severe cervical sprains are immediately immobilized. Further treatment is done to help with pain, muscle spasms, and other symptoms and may include:  Medicines, such as pain relievers, numbing medicines, or muscle relaxants.   Physical therapy. This may involve stretching exercises, strengthening exercises, and posture training. Exercises and improved posture can help stabilize the neck, strengthen muscles, and help stop symptoms from returning.  HOME CARE INSTRUCTIONS   Put ice on the injured area.   Put ice in a plastic bag.   Place a towel between your skin and the bag.   Leave the ice on for 15 20 minutes, 3 4 times a day.   If your injury was severe, you may have been given a cervical collar to wear. A cervical collar is a two-piece collar designed to keep your neck from moving while it heals.  Do not remove the collar unless instructed by your health care provider.  If you have long hair, keep it outside of the collar.  Ask your health care provider before making any adjustments to your collar.   Minor adjustments may be required over time to improve comfort and reduce pressure on your chin or on the back of your head.  Ifyou are allowed to remove the collar for cleaning or bathing, follow your health care provider's instructions on how to do so safely.  Keep your collar clean by wiping it with mild soap and water and drying it completely. If the collar you have been given includes removable pads, remove them every 1 2 days and hand wash them with soap and water. Allow them to air dry. They should be completely  dry before you wear them in the collar.  If you are allowed to remove the collar for cleaning and bathing, wash and dry the skin of your neck. Check your skin for irritation or sores. If you see any, tell your health care provider.  Do not drive while wearing the collar.   Only take over-the-counter or prescription medicines for pain, discomfort, or fever as directed by your health care provider.   Keep all follow-up appointments as directed by your health care provider.   Keep all physical therapy appointments as directed by your health care provider.   Make any needed adjustments to your workstation to promote good posture.   Avoid positions and activities that make your symptoms worse.   Warm up and stretch before being active to help prevent problems.  SEEK MEDICAL CARE IF:   Your pain is not controlled with medicine.   You are unable to decrease your pain medicine over time as planned.   Your activity level is not improving as expected.  SEEK IMMEDIATE MEDICAL CARE IF:   You develop any bleeding.  You develop stomach upset.  You have signs of an allergic reaction to your medicine.   Your symptoms get worse.   You develop new, unexplained symptoms.   You have numbness, tingling, weakness, or paralysis in any part of your body.  MAKE SURE YOU:   Understand these instructions.  Will watch your condition.  Will get help right away if you are not doing well or get worse. Document Released: 05/18/2007 Document Revised: 05/11/2013 Document Reviewed: 01/26/2013 [employer] Patient Information 2014 [employer]  

## 2013-11-16 NOTE — Progress Notes (Signed)
   Subjective:    Patient ID: Shaun [name], male    DOB: Jan 15, 1966, 48 y.o.   MRN: [id number]  HPI 48 y.o. presented 10/27/13 for a very atypical shoulder pain. At that time it was only happening at night, waking up with shooting electric pain. Labs were all negative and patient was given Gabapentin sample 100mg  taking 3 a night which helped for 2 night then stopped, and he then switched to amitriptyline that did not help. He continues to get the pain around 12 AM, left sided headache, states his "veins in his neck feel funny", last for 30 mins. He has tried stretching, moving, resting, Vicodin, heat, ice and nothing has helped. He states that he is now having some left sided shoulder pain throughout the day but it is worse at night. He has had diarrhea and has diffuse small pruritic rash on arms and chest. Diarrhea and rash are better and he has not had a headache today or yesterday.   Cheeks are flushed, states he has started to notice him dropping car keys or small things, denies tick bite. When riding his motorcycle this weekend, both hand felt numb. Denies changes in vision, changes in speech, CP, SOB, sore throat, AB pain, nausea, easy bleeding, tremor, imbalance, dizziness.    Review of Systems  Constitutional: Negative for fever, chills, diaphoresis, activity change, appetite change, fatigue and unexpected weight change.  Eyes: Negative.   Respiratory: Negative.   Cardiovascular: Negative.   Gastrointestinal: Negative.   Endocrine: Negative.   Genitourinary: Negative.   Musculoskeletal: Positive for arthralgias, back pain and neck pain. Negative for gait problem, joint swelling, myalgias and neck stiffness.  Skin: Positive for rash. Negative for color change, pallor and wound.  Neurological: Positive for numbness and headaches. Negative for dizziness, tremors, seizures, syncope, facial asymmetry, speech difficulty and light-headedness.  Hematological: Negative.    Psychiatric/Behavioral: Negative.        Objective:   Physical Exam  Constitutional: He is oriented to person, place, and time. He appears well-developed and well-nourished.  HENT:  Head: Normocephalic and atraumatic.  Eyes: Conjunctivae and EOM are normal. Pupils are equal, round, and reactive to light.  Neck: Normal range of motion. Neck supple. No JVD present. No thyromegaly present.  Cardiovascular: Normal rate, regular rhythm and normal heart sounds.   No murmur heard. Pulmonary/Chest: Effort normal and breath sounds normal. He has no wheezes. He exhibits no tenderness.  Abdominal: Soft. Bowel sounds are normal. He exhibits no distension. There is no tenderness.  obese  Musculoskeletal: Normal range of motion. He exhibits tenderness (along left neck, left shoulder). He exhibits no edema.  Lymphadenopathy:    He has no cervical adenopathy.  Neurological: He is alert and oriented to person, place, and time.  Skin: Skin is warm and dry.       Assessment & Plan:  ? Nerve pain,  trigger point injection,  Cervical and thoracic Xray,  Gabapentin and lyrica given

## 2013-11-17 NOTE — Addendum Note (Signed)
Addended by: Quentin MullingOLLIER, Crucita Lacorte R on: 11/17/2013 08:46 AM   Modules accepted: Orders

## 2013-11-30 ENCOUNTER — Ambulatory Visit: Payer: Self-pay | Admitting: Physician Assistant

## 2014-02-16 NOTE — Progress Notes (Signed)
Patient ID: Shaun [name], male   DOB: Dec 29, 1965, 48 y.o.   MRN: [id number]   Annual Screening Comprehensive Examination  This very nice 48 y.o.[name] presents for complete physical.  Patient has been followed for HTN,  T2_NIDDM, Hyperlipidemia, and Vitamin D Deficiency.   HTN predates since 1999. Patient's BP has been controlled at home.Today's BP: 140/98 mmHg. Patient denies any cardiac symptoms as chest pain, palpitations, shortness of breath, dizziness or ankle swelling.   Patient's hyperlipidemia is controlled with diet. Patient denies myalgias or other medication SE's. Last lipids were at goal for Cholesterol, but Triglycerides were very high at 399.   Patient has T2_NIDDM and last A1c was 11.5% in Jan 2015 and Onglyza was added to his then current regimen of Metformin, Invokana and glyburide and patient never kept 1 month f/u as advised.  Patient denies reactive hypoglycemic symptoms, visual blurring, diabetic polys, or paresthesias.   Finally, patient has history of Vitamin D Deficiency of    and last vitamin D was 10433 in Jan 2015 with patient off Vit D supplements.   Medication Sig  . atenolol (TENORMIN) 100 MG tablet Take 100 mg  daily.  . Canagliflozin (INVOKANA) 300 MG TABS 1 daily for sugar  . cholecalciferol (VITAMIN D) 1000 UNITS tablet Take 4,000 Units  daily.  Marland Kitchen. gabapentin (NEURONTIN) 300 MG capsule 1-3 at night for pain  . glyBURIDE (DIABETA) 5 MG tablet Take 5 mg  3 times daily with meals.  Awanda Mink. NORCO 5-325 MG per tablet Take 1 tablet every 6  hours as needed  . metFORMIN XR 500 MG 24 hr tablet  2 pills twice daily with food  . MultiVitamins-Minerals Take by mouth.   Allergies  Allergen Reactions  . Zocor [Simvastatin]     Headache  . Zoloft [Sertraline Hcl]     dysphona   Past Medical History  Diagnosis Date  . Hypertension   . Hyperlipidemia   . Diabetes mellitus without complication   . GERD (gastroesophageal reflux disease)   . Asthma   . Gout   . Fatty  liver disease, nonalcoholic   . Obesity   . Venous insufficiency   . Hypogonadism male    Past Surgical History  Procedure Laterality Date  . Anterior cruciate ligament repair Left 2001  . Knee arthroscopy Right 2002, 1995  . Cystoscopy  1993  . Esophagogastroduodenoscopy  1995, 2002    gastritis, esophagititis   Family History  Problem Relation Age of Onset  . Diabetes Mother   . Hypertension Mother   . Gout Mother   . COPD Father   . Cancer Sister 751    appendix  . Gout Brother   . Hyperlipidemia Brother   . Hypertension Brother   . Hypertension Brother   . Hyperlipidemia Brother   . Gout Brother   . Diabetes Brother   . COPD Sister    History   Social History  . Marital Status: Married    Spouse Name: N/A    Number of Children: N/A  . Years of Education: N/A   Occupational History  . Not on file.   Social History Main Topics  . Smoking status: Former Smoker -- 1.00 packs/day for 15 years    Types: Cigarettes    Quit date: 08/07/2000  . Smokeless tobacco: Never Used  . Alcohol Use: Yes     Comment: social  . Drug Use: No  . Sexual Activity: Not on file   Other Topics Concern  . Not  on file   Social History Narrative  . No narrative on file    ROS Constitutional: Denies fever, chills, weight loss/gain, headaches, insomnia, fatigue, night sweats or change in appetite. Eyes: Denies redness, blurred vision, diplopia, discharge, itchy or watery eyes.  ENT: Denies discharge, congestion, post nasal drip, epistaxis, sore throat, earache, hearing loss, dental pain, Tinnitus, Vertigo, Sinus pain or snoring.  Cardio: Denies chest pain, palpitations, irregular heartbeat, syncope, dyspnea, diaphoresis, orthopnea, PND, claudication or edema Respiratory: denies cough, dyspnea, DOE, pleurisy, hoarseness, laryngitis or wheezing.  Gastrointestinal: Denies dysphagia, heartburn, reflux, water brash, pain, cramps, nausea, vomiting, bloating, diarrhea, constipation,  hematemesis, melena, hematochezia, jaundice or hemorrhoids Genitourinary: Denies dysuria, frequency, urgency, nocturia, hesitancy, discharge, hematuria or flank pain Musculoskeletal: Denies arthralgia, myalgia, stiffness, Jt. Swelling, pain, limp or strain/sprain. Denies Falls. Skin: Denies puritis, rash, hives, warts, acne, eczema or change in skin lesion Neuro: No weakness, tremor, incoordination, spasms, paresthesia or pain Psychiatric: Denies confusion, memory loss or sensory loss. Denies Depression. Endocrine: Denies change in weight, skin, hair change, nocturia, and paresthesia, diabetic polys, visual blurring or hyper / hypo glycemic episodes.  Heme/Lymph: No excessive bleeding, bruising or enlarged lymph nodes.  Physical Exam  BP 140/98  P 88  T 97.9 F   R 16  Ht 5' 10.5"   Wt 220 lb   BMI 31.11 kg/m2  General Appearance: Well nourished, in no apparent distress. Eyes: PERRLA, EOMs, conjunctiva no swelling or erythema, normal fundi and vessels. Sinuses: No frontal/maxillary tenderness ENT/Mouth: EACs patent / TMs  nl. Nares clear without erythema, swelling, mucoid exudates. Oral hygiene is good. No erythema, swelling, or exudate. Tongue normal, non-obstructing. Tonsils not swollen or erythematous. Hearing normal.  Neck: Supple, thyroid normal. No bruits, nodes or JVD. Respiratory: Respiratory effort normal.  BS equal and clear bilateral without rales, rhonci, wheezing or stridor. Cardio: Heart sounds are normal with regular rate and rhythm and no murmurs, rubs or gallops. Peripheral pulses are normal and equal bilaterally without edema. No aortic or femoral bruits. Chest: symmetric with normal excursions and percussion.  Abdomen: Flat, soft, with bowl sounds. Nontender, no guarding, rebound, hernias, masses, or organomegaly.  Lymphatics: Non tender without lymphadenopathy.  Genitourinary: No hernias.Testes nl. DRE - prostate nl for age - smooth & firm w/o  nodules. Musculoskeletal: Full ROM all peripheral extremities, joint stability, 5/5 strength, and normal gait. Skin: Warm and dry without rashes, lesions, cyanosis, clubbing or  ecchymosis.  Neuro: Cranial nerves intact, reflexes equal bilaterally. Normal muscle tone, no cerebellar symptoms. Sensation intact.  Pysch: Awake and oriented X 3with normal affect, insight and judgment appropriate.   Assessment and Plan  1. Annual Screening Examination 2. Hypertension  3. Hyperlipidemia 4. T2_NIDDM 5. Vitamin D Deficiency  Continue prudent diet as discussed, weight control, BP monitoring, regular exercise, and medications as discussed.  Discussed med effects and SE's. Routine screening labs and tests as requested with regular follow-up as recommended.

## 2014-02-17 ENCOUNTER — Encounter: Payer: Self-pay | Admitting: Internal Medicine

## 2014-02-17 ENCOUNTER — Ambulatory Visit (INDEPENDENT_AMBULATORY_CARE_PROVIDER_SITE_OTHER): Payer: BC Managed Care – PPO | Admitting: Internal Medicine

## 2014-02-17 VITALS — BP 140/98 | HR 88 | Temp 97.9°F | Resp 16 | Ht 70.5 in | Wt 220.0 lb

## 2014-02-17 DIAGNOSIS — Z79899 Other long term (current) drug therapy: Secondary | ICD-10-CM

## 2014-02-17 DIAGNOSIS — Z113 Encounter for screening for infections with a predominantly sexual mode of transmission: Secondary | ICD-10-CM

## 2014-02-17 DIAGNOSIS — E559 Vitamin D deficiency, unspecified: Secondary | ICD-10-CM

## 2014-02-17 DIAGNOSIS — I10 Essential (primary) hypertension: Secondary | ICD-10-CM

## 2014-02-17 DIAGNOSIS — Z125 Encounter for screening for malignant neoplasm of prostate: Secondary | ICD-10-CM

## 2014-02-17 DIAGNOSIS — Z0000 Encounter for general adult medical examination without abnormal findings: Secondary | ICD-10-CM

## 2014-02-17 DIAGNOSIS — E119 Type 2 diabetes mellitus without complications: Secondary | ICD-10-CM

## 2014-02-17 DIAGNOSIS — R7401 Elevation of levels of liver transaminase levels: Secondary | ICD-10-CM

## 2014-02-17 DIAGNOSIS — Z1212 Encounter for screening for malignant neoplasm of rectum: Secondary | ICD-10-CM

## 2014-02-17 DIAGNOSIS — R7402 Elevation of levels of lactic acid dehydrogenase (LDH): Secondary | ICD-10-CM

## 2014-02-17 DIAGNOSIS — Z111 Encounter for screening for respiratory tuberculosis: Secondary | ICD-10-CM

## 2014-02-17 DIAGNOSIS — R740 Nonspecific elevation of levels of transaminase and lactic acid dehydrogenase [LDH]: Secondary | ICD-10-CM

## 2014-02-17 LAB — CBC WITH DIFFERENTIAL/PLATELET
Basophils Absolute: 0 10*3/uL (ref 0.0–0.1)
Basophils Relative: 0 % (ref 0–1)
Eosinophils Absolute: 0.2 10*3/uL (ref 0.0–0.7)
Eosinophils Relative: 4 % (ref 0–5)
HCT: 49.1 % (ref 39.0–52.0)
Hemoglobin: 17.2 g/dL — ABNORMAL HIGH (ref 13.0–17.0)
Lymphocytes Relative: 41 % (ref 12–46)
Lymphs Abs: 1.9 10*3/uL (ref 0.7–4.0)
MCH: 29.2 pg (ref 26.0–34.0)
MCHC: 35 g/dL (ref 30.0–36.0)
MCV: 83.2 fL (ref 78.0–100.0)
Monocytes Absolute: 0.3 10*3/uL (ref 0.1–1.0)
Monocytes Relative: 6 % (ref 3–12)
Neutro Abs: 2.3 10*3/uL (ref 1.7–7.7)
Neutrophils Relative %: 49 % (ref 43–77)
Platelets: 183 10*3/uL (ref 150–400)
RBC: 5.9 MIL/uL — ABNORMAL HIGH (ref 4.22–5.81)
RDW: 13.6 % (ref 11.5–15.5)
WBC: 4.6 10*3/uL (ref 4.0–10.5)

## 2014-02-17 MED ORDER — COLCHICINE 0.6 MG PO TABS: 0.6000 mg | ORAL_TABLET | Freq: Two times a day (BID) | ORAL | Status: AC

## 2014-02-17 NOTE — Patient Instructions (Signed)
Recommend the book "The END of DIETING" by Dr Baker Janus   and the book "The END of DIABETES " by Dr Excell Seltzer  At [email or web address] - get book & Audio CD's      Being diabetic has a  300% increased risk for heart attack, stroke, cancer, and alzheimer- type vascular dementia. It is very important that you work harder with diet by avoiding all foods that are white except chicken & fish. Avoid white rice (brown & wild rice is OK), white potatoes (sweetpotatoes in moderation is OK), White bread or wheat bread or anything made out of white flour like bagels, donuts, rolls, buns, biscuits, cakes, pastries, cookies, pizza crust, and pasta (made from white flour & egg whites) - vegetarian pasta or spinach or wheat pasta is OK. Multigrain breads like Arnold's or [employer], or multigrain sandwich thins or flatbreads.  Diet, exercise and weight loss can reverse and cure diabetes in the early stages.  Diet, exercise and weight loss is very important in the control and prevention of complications of diabetes which affects every system in your body, ie. Brain - dementia/stroke, eyes - glaucoma/blindness, heart - heart attack/heart failure, kidneys - dialysis, stomach - gastric paralysis, intestines - malabsorption, nerves - severe painful neuritis, circulation - gangrene & loss of a leg(s), and finally cancer and Alzheimers.    I recommend avoid fried & greasy foods,  sweets/candy, white rice (brown or wild rice or Quinoa is OK), white potatoes (sweet potatoes are OK) - anything made from white flour - bagels, doughnuts, rolls, buns, biscuits,white and wheat breads, pizza crust and traditional pasta made of white flour & egg white(vegetarian pasta or spinach or wheat pasta is OK).  Multi-grain bread is OK - like multi-grain flat bread or sandwich thins. Avoid alcohol in excess. Exercise is also important.    Eat all the vegetables you want - avoid meat, especially red meat and dairy - especially cheese.  Cheese  is the most concentrated form of trans-fats which is the worst thing to clog up our arteries. Veggie cheese is OK which can be found in the fresh produce section at [employer] or [employer] or [employer]  Preventive Care for Adults A healthy lifestyle and preventive care can promote health and wellness. Preventive health guidelines for men include the following key practices:  A routine yearly physical is a good way to check with your health care provider about your health and preventative screening. It is a chance to share any concerns and updates on your health and to receive a thorough exam.  Visit your dentist for a routine exam and preventative care every 6 months. Brush your teeth twice a day and floss once a day. Good oral hygiene prevents tooth decay and gum disease.  The frequency of eye exams is based on your age, health, family medical history, use of contact lenses, and other factors. Follow your health care provider's recommendations for frequency of eye exams.  Eat a healthy diet. Foods such as vegetables, fruits, whole grains, low-fat dairy products, and lean protein foods contain the nutrients you need without too many calories. Decrease your intake of foods high in solid fats, added sugars, and salt. Eat the right amount of calories for you.Get information about a proper diet from your health care provider, if necessary.  Regular physical exercise is one of the most important things you can do for your health. Most adults should get at least 150 minutes of moderate-intensity exercise (any activity that  increases your heart rate and causes you to sweat) each week. In addition, most adults need muscle-strengthening exercises on 2 or more days a week.  Maintain a healthy weight. The body mass index (BMI) is a screening tool to identify possible weight problems. It provides an estimate of body fat based on height and weight. Your health care provider can find your BMI and can help you  achieve or maintain a healthy weight.For adults 20 years and older:  A BMI below 18.5 is considered underweight.  A BMI of 18.5 to 24.9 is normal.  A BMI of 25 to 29.9 is considered overweight.  A BMI of 30 and above is considered obese.  Maintain normal blood lipids and cholesterol levels by exercising and minimizing your intake of saturated fat. Eat a balanced diet with plenty of fruit and vegetables. Blood tests for lipids and cholesterol should begin at age 20 and be repeated every 5 years. If your lipid or cholesterol levels are high, you are over 50, or you are at high risk for heart disease, you may need your cholesterol levels checked more frequently.Ongoing high lipid and cholesterol levels should be treated with medicines if diet and exercise are not working.  If you smoke, find out from your health care provider how to quit. If you do not use tobacco, do not start.  Lung cancer screening is recommended for adults aged 72-80 years who are at high risk for developing lung cancer because of a history of smoking. A yearly low-dose CT scan of the lungs is recommended for people who have at least a 30-pack-year history of smoking and are a current smoker or have quit within the past 15 years. A pack year of smoking is smoking an average of 1 pack of cigarettes a day for 1 year (for example: 1 pack a day for 30 years or 2 packs a day for 15 years). Yearly screening should continue until the smoker has stopped smoking for at least 15 years. Yearly screening should be stopped for people who develop a health problem that would prevent them from having lung cancer treatment.  If you choose to drink alcohol, do not have more than 2 drinks per day. One drink is considered to be 12 ounces (355 mL) of beer, 5 ounces (148 mL) of wine, or 1.5 ounces (44 mL) of liquor.  Avoid use of street drugs. Do not share needles with anyone. Ask for help if you need support or instructions about stopping the use of  drugs.  High blood pressure causes heart disease and increases the risk of stroke. Your blood pressure should be checked at least every 1-2 years. Ongoing high blood pressure should be treated with medicines, if weight loss and exercise are not effective.  If you are 28-64 years old, ask your health care provider if you should take aspirin to prevent heart disease.  Diabetes screening involves taking a blood sample to check your fasting blood sugar level. This should be done once every 3 years, after age 13, if you are within normal weight and without risk factors for diabetes. Testing should be considered at a younger age or be carried out more frequently if you are overweight and have at least 1 risk factor for diabetes.  Colorectal cancer can be detected and often prevented. Most routine colorectal cancer screening begins at the age of 78 and continues through age 56. However, your health care provider may recommend screening at an earlier age if you have risk  factors for colon cancer. On a yearly basis, your health care provider may provide home test kits to check for hidden blood in the stool. Use of a small camera at the end of a tube to directly examine the colon (sigmoidoscopy or colonoscopy) can detect the earliest forms of colorectal cancer. Talk to your health care provider about this at age 48, when routine screening begins. Direct exam of the colon should be repeated every 5-10 years through age 60, unless early forms of precancerous polyps or small growths are found.  People who are at an increased risk for hepatitis B should be screened for this virus. You are considered at high risk for hepatitis B if:  You were born in a country where hepatitis B occurs often. Talk with your health care provider about which countries are considered high risk.  Your parents were born in a high-risk country and you have not received a shot to protect against hepatitis B (hepatitis B vaccine).  You have  HIV or AIDS.  You use needles to inject street drugs.  You live with, or have sex with, someone who has hepatitis B.  You are a man who has sex with other men (MSM).  You get hemodialysis treatment.  You take certain medicines for conditions such as cancer, organ transplantation, and autoimmune conditions.  Hepatitis C blood testing is recommended for all people born from [birth year] through 1965 and any individual with known risks for hepatitis C.  Practice safe sex. Use condoms and avoid high-risk sexual practices to reduce the spread of sexually transmitted infections (STIs). STIs include gonorrhea, chlamydia, syphilis, trichomonas, herpes, HPV, and human immunodeficiency virus (HIV). Herpes, HIV, and HPV are viral illnesses that have no cure. They can result in disability, cancer, and death.  If you are at risk of being infected with HIV, it is recommended that you take a prescription medicine daily to prevent HIV infection. This is called preexposure prophylaxis (PrEP). You are considered at risk if:  You are a man who has sex with other men (MSM) and have other risk factors.  You are a heterosexual man, are sexually active, and are at increased risk for HIV infection.  You take drugs by injection.  You are sexually active with a partner who has HIV.  Talk with your health care provider about whether you are at high risk of being infected with HIV. If you choose to begin PrEP, you should first be tested for HIV. You should then be tested every 3 months for as long as you are taking PrEP.  A one-time screening for abdominal aortic aneurysm (AAA) and surgical repair of large AAAs by ultrasound are recommended for men ages 51 to 11 years who are current or former smokers.  Healthy men should no longer receive prostate-specific antigen (PSA) blood tests as part of routine cancer screening. Talk with your health care provider about prostate cancer screening.  Testicular cancer screening is  not recommended for adult males who have no symptoms. Screening includes self-exam, a health care provider exam, and other screening tests. Consult with your health care provider about any symptoms you have or any concerns you have about testicular cancer.  Use sunscreen. Apply sunscreen liberally and repeatedly throughout the day. You should seek shade when your shadow is shorter than you. Protect yourself by wearing long sleeves, pants, a wide-brimmed hat, and sunglasses year round, whenever you are outdoors.  Once a month, do a whole-body skin exam, using a mirror to look  at the skin on your back. Tell your health care provider about new moles, moles that have irregular borders, moles that are larger than a pencil eraser, or moles that have changed in shape or color.  Stay current with required vaccines (immunizations).  Influenza vaccine. All adults should be immunized every year.  Tetanus, diphtheria, and acellular pertussis (Td, Tdap) vaccine. An adult who has not previously received Tdap or who does not know his vaccine status should receive 1 dose of Tdap. This initial dose should be followed by tetanus and diphtheria toxoids (Td) booster doses every 10 years. Adults with an unknown or incomplete history of completing a 3-dose immunization series with Td-containing vaccines should begin or complete a primary immunization series including a Tdap dose. Adults should receive a Td booster every 10 years.  Varicella vaccine. An adult without evidence of immunity to varicella should receive 2 doses or a second dose if he has previously received 1 dose.  Human papillomavirus (HPV) vaccine. Males aged 29-21 years who have not received the vaccine previously should receive the 3-dose series. Males aged 22-26 years may be immunized. Immunization is recommended through the age of 26 years for any male who has sex with males and did not get any or all doses earlier. Immunization is recommended for any  person with an immunocompromised condition through the age of 29 years if he did not get any or all doses earlier. During the 3-dose series, the second dose should be obtained 4-8 weeks after the first dose. The third dose should be obtained 24 weeks after the first dose and 16 weeks after the second dose.  Zoster vaccine. One dose is recommended for adults aged 38 years or older unless certain conditions are present.  Measles, mumps, and rubella (MMR) vaccine. Adults born before [birth year] generally are considered immune to measles and mumps. Adults born in [birth year] or later should have 1 or more doses of MMR vaccine unless there is a contraindication to the vaccine or there is laboratory evidence of immunity to each of the three diseases. A routine second dose of MMR vaccine should be obtained at least 28 days after the first dose for students attending postsecondary schools, health care workers, or international travelers. People who received inactivated measles vaccine or an unknown type of measles vaccine during 1963-1967 should receive 2 doses of MMR vaccine. People who received inactivated mumps vaccine or an unknown type of mumps vaccine before 1979 and are at high risk for mumps infection should consider immunization with 2 doses of MMR vaccine. Unvaccinated health care workers born before [birth year] who lack laboratory evidence of measles, mumps, or rubella immunity or laboratory confirmation of disease should consider measles and mumps immunization with 2 doses of MMR vaccine or rubella immunization with 1 dose of MMR vaccine.  Pneumococcal 13-valent conjugate (PCV13) vaccine. When indicated, a person who is uncertain of his immunization history and has no record of immunization should receive the PCV13 vaccine. An adult aged 68 years or older who has certain medical conditions and has not been previously immunized should receive 1 dose of PCV13 vaccine. This PCV13 should be followed with a dose of pneumococcal  polysaccharide (PPSV23) vaccine. The PPSV23 vaccine dose should be obtained at least 8 weeks after the dose of PCV13 vaccine. An adult aged 95 years or older who has certain medical conditions and previously received 1 or more doses of PPSV23 vaccine should receive 1 dose of PCV13. The PCV13 vaccine dose should be obtained 1  or more years after the last PPSV23 vaccine dose.  Pneumococcal polysaccharide (PPSV23) vaccine. When PCV13 is also indicated, PCV13 should be obtained first. All adults aged 70 years and older should be immunized. An adult younger than age 73 years who has certain medical conditions should be immunized. Any person who resides in a nursing home or long-term care facility should be immunized. An adult smoker should be immunized. People with an immunocompromised condition and certain other conditions should receive both PCV13 and PPSV23 vaccines. People with human immunodeficiency virus (HIV) infection should be immunized as soon as possible after diagnosis. Immunization during chemotherapy or radiation therapy should be avoided. Routine use of PPSV23 vaccine is not recommended for American Indians, [place] Natives, or people younger than 65 years unless there are medical conditions that require PPSV23 vaccine. When indicated, people who have unknown immunization and have no record of immunization should receive PPSV23 vaccine. One-time revaccination 5 years after the first dose of PPSV23 is recommended for people aged 19-64 years who have chronic kidney failure, nephrotic syndrome, asplenia, or immunocompromised conditions. People who received 1-2 doses of PPSV23 before age 44 years should receive another dose of PPSV23 vaccine at age 61 years or later if at least 5 years have passed since the previous dose. Doses of PPSV23 are not needed for people immunized with PPSV23 at or after age 30 years.  Meningococcal vaccine. Adults with asplenia or persistent complement component deficiencies  should receive 2 doses of quadrivalent meningococcal conjugate (MenACWY-D) vaccine. The doses should be obtained at least 2 months apart. Microbiologists working with certain meningococcal bacteria, [employer] recruits, people at risk during an outbreak, and people who travel to or live in countries with a high rate of meningitis should be immunized. A first-year college student up through age 56 years who is living in a residence hall should receive a dose if he did not receive a dose on or after his 16th birthday. Adults who have certain high-risk conditions should receive one or more doses of vaccine.  Hepatitis A vaccine. Adults who wish to be protected from this disease, have certain high-risk conditions, work with hepatitis A-infected animals, work in hepatitis A research labs, or travel to or work in countries with a high rate of hepatitis A should be immunized. Adults who were previously unvaccinated and who anticipate close contact with an international adoptee during the first 60 days after arrival in the [place] from a country with a high rate of hepatitis A should be immunized.  Hepatitis B vaccine. Adults should be immunized if they wish to be protected from this disease, have certain high-risk conditions, may be exposed to blood or other infectious body fluids, are household contacts or sex partners of hepatitis B positive people, are clients or workers in certain care facilities, or travel to or work in countries with a high rate of hepatitis B.  Haemophilus influenzae type b (Hib) vaccine. A previously unvaccinated person with asplenia or sickle cell disease or having a scheduled splenectomy should receive 1 dose of Hib vaccine. Regardless of previous immunization, a recipient of a hematopoietic stem cell transplant should receive a 3-dose series 6-12 months after his successful transplant. Hib vaccine is not recommended for adults with HIV infection. Preventive Service / Frequency Ages  67 to 25  Blood pressure check.** / Every 1 to 2 years.  Lipid and cholesterol check.** / Every 5 years beginning at age 52.  Hepatitis C blood test.** / For any individual with known risks  for hepatitis C.  Skin self-exam. / Monthly.  Influenza vaccine. / Every year.  Tetanus, diphtheria, and acellular pertussis (Tdap, Td) vaccine.** / Consult your health care provider. 1 dose of Td every 10 years.  Varicella vaccine.** / Consult your health care provider.  HPV vaccine. / 3 doses over 6 months, if 84 or younger.  Measles, mumps, rubella (MMR) vaccine.** / You need at least 1 dose of MMR if you were born in 1957 or later. You may also need a second dose.  Pneumococcal 13-valent conjugate (PCV13) vaccine.** / Consult your health care provider.  Pneumococcal polysaccharide (PPSV23) vaccine.** / 1 to 2 doses if you smoke cigarettes or if you have certain conditions.  Meningococcal vaccine.** / 1 dose if you are age 67 to 83 years and a Market researcher living in a residence hall, or have one of several medical conditions. You may also need additional booster doses.  Hepatitis A vaccine.** / Consult your health care provider.  Hepatitis B vaccine.** / Consult your health care provider.  Haemophilus influenzae type b (Hib) vaccine.** / Consult your health care provider. Ages 61 to 38  Blood pressure check.** / Every 1 to 2 years.  Lipid and cholesterol check.** / Every 5 years beginning at age 52.  Lung cancer screening. / Every year if you are aged 88-80 years and have a 30-pack-year history of smoking and currently smoke or have quit within the past 15 years. Yearly screening is stopped once you have quit smoking for at least 15 years or develop a health problem that would prevent you from having lung cancer treatment.  Fecal occult blood test (FOBT) of stool. / Every year beginning at age 90 and continuing until age 40. You may not have to do this test if you get a  colonoscopy every 10 years.  Flexible sigmoidoscopy** or colonoscopy.** / Every 5 years for a flexible sigmoidoscopy or every 10 years for a colonoscopy beginning at age 53 and continuing until age 25.  Hepatitis C blood test.** / For all people born from [birth year] through 1966 and any individual with known risks for hepatitis C.  Skin self-exam. / Monthly.  Influenza vaccine. / Every year.  Tetanus, diphtheria, and acellular pertussis (Tdap/Td) vaccine.** / Consult your health care provider. 1 dose of Td every 10 years.  Varicella vaccine.** / Consult your health care provider.  Zoster vaccine.** / 1 dose for adults aged 31 years or older.  Measles, mumps, rubella (MMR) vaccine.** / You need at least 1 dose of MMR if you were born in 1958 or later. You may also need a second dose.  Pneumococcal 13-valent conjugate (PCV13) vaccine.** / Consult your health care provider.  Pneumococcal polysaccharide (PPSV23) vaccine.** / 1 to 2 doses if you smoke cigarettes or if you have certain conditions.  Meningococcal vaccine.** / Consult your health care provider.  Hepatitis A vaccine.** / Consult your health care provider.  Hepatitis B vaccine.** / Consult your health care provider.  Haemophilus influenzae type b (Hib) vaccine.** / Consult your health care provider.

## 2014-02-18 LAB — PSA: PSA: 0.21 ng/mL (ref <=4.00)

## 2014-02-18 LAB — URINALYSIS, MICROSCOPIC ONLY
Bacteria, UA: NONE SEEN
Casts: NONE SEEN
Crystals: NONE SEEN
Squamous Epithelial / LPF: NONE SEEN

## 2014-02-18 LAB — TSH: TSH: 1.407 u[IU]/mL (ref 0.350–4.500)

## 2014-02-18 LAB — BASIC METABOLIC PANEL WITH GFR
BUN: 13 mg/dL (ref 6–23)
CO2: 25 mEq/L (ref 19–32)
Calcium: 9.3 mg/dL (ref 8.4–10.5)
Chloride: 101 mEq/L (ref 96–112)
Creat: 0.55 mg/dL (ref 0.50–1.35)
GFR, Est African American: >89 mL/min
GFR, Est Non African American: >89 mL/min
Glucose, Bld: 332 mg/dL — ABNORMAL HIGH (ref 70–99)
Potassium: 4.2 mEq/L (ref 3.5–5.3)
Sodium: 139 mEq/L (ref 135–145)

## 2014-02-18 LAB — LIPID PANEL
Cholesterol: 152 mg/dL (ref 0–200)
HDL: 27 mg/dL — ABNORMAL LOW (ref >39)
LDL Cholesterol: 74 mg/dL (ref 0–99)
Total CHOL/HDL Ratio: 5.6 Ratio
Triglycerides: 253 mg/dL — ABNORMAL HIGH (ref <150)
VLDL: 51 mg/dL — ABNORMAL HIGH (ref 0–40)

## 2014-02-18 LAB — HEMOGLOBIN A1C
Hgb A1c MFr Bld: 11.8 % — ABNORMAL HIGH (ref <5.7)
Mean Plasma Glucose: 292 mg/dL — ABNORMAL HIGH (ref <117)

## 2014-02-18 LAB — VITAMIN B12: Vitamin B-12: 483 pg/mL (ref 211–911)

## 2014-02-18 LAB — MICROALBUMIN / CREATININE URINE RATIO
Creatinine, Urine: 65.1 mg/dL
Microalb Creat Ratio: 28.6 mg/g (ref 0.0–30.0)
Microalb, Ur: 1.86 mg/dL (ref 0.00–1.89)

## 2014-02-18 LAB — HEPATITIS A ANTIBODY, TOTAL: Hep A Total Ab: NONREACTIVE

## 2014-02-18 LAB — HEPATITIS B SURFACE ANTIBODY,QUALITATIVE: Hep B S Ab: NEGATIVE

## 2014-02-18 LAB — HEPATIC FUNCTION PANEL
ALT: 15 U/L (ref 0–53)
AST: 12 U/L (ref 0–37)
Albumin: 4.2 g/dL (ref 3.5–5.2)
Alkaline Phosphatase: 80 U/L (ref 39–117)
Bilirubin, Direct: 0.1 mg/dL (ref 0.0–0.3)
Indirect Bilirubin: 0.5 mg/dL (ref 0.2–1.2)
Total Bilirubin: 0.6 mg/dL (ref 0.2–1.2)
Total Protein: 6.7 g/dL (ref 6.0–8.3)

## 2014-02-18 LAB — HIV ANTIBODY (ROUTINE TESTING W REFLEX): HIV 1&2 Ab, 4th Generation: NONREACTIVE

## 2014-02-18 LAB — MAGNESIUM: Magnesium: 1.7 mg/dL (ref 1.5–2.5)

## 2014-02-18 LAB — HEPATITIS B CORE ANTIBODY, TOTAL: Hep B Core Total Ab: NONREACTIVE

## 2014-02-18 LAB — VITAMIN D 25 HYDROXY (VIT D DEFICIENCY, FRACTURES): Vit D, 25-Hydroxy: 37 ng/mL (ref 30–89)

## 2014-02-18 LAB — INSULIN, FASTING: Insulin fasting, serum: 12 u[IU]/mL (ref 3–28)

## 2014-02-18 LAB — HEPATITIS C ANTIBODY: HCV Ab: NEGATIVE

## 2014-02-18 LAB — RPR

## 2014-02-18 LAB — TESTOSTERONE: Testosterone: 280 ng/dL — ABNORMAL LOW (ref 300–890)

## 2014-02-18 LAB — URIC ACID: Uric Acid, Serum: 6 mg/dL (ref 4.0–7.8)

## 2014-02-20 LAB — HEPATITIS B E ANTIBODY: Hepatitis Be Antibody: NONREACTIVE

## 2014-02-20 LAB — TB SKIN TEST
Induration: 0 mm
TB Skin Test: NEGATIVE

## 2014-04-07 ENCOUNTER — Other Ambulatory Visit (INDEPENDENT_AMBULATORY_CARE_PROVIDER_SITE_OTHER): Payer: BC Managed Care – PPO

## 2014-04-07 DIAGNOSIS — N3000 Acute cystitis without hematuria: Secondary | ICD-10-CM

## 2014-04-08 LAB — URINALYSIS, COMPLETE
Bacteria, UA: NONE SEEN
Bilirubin Urine: NEGATIVE
Casts: NONE SEEN
Crystals: NONE SEEN
Glucose, UA: >1000 mg/dL — AB
Hgb urine dipstick: NEGATIVE
Ketones, ur: NEGATIVE mg/dL
Leukocytes, UA: NEGATIVE
Nitrite: NEGATIVE
Protein, ur: NEGATIVE mg/dL
Specific Gravity, Urine: >1.03 — ABNORMAL HIGH (ref 1.005–1.030)
Squamous Epithelial / LPF: NONE SEEN
Urobilinogen, UA: 1 mg/dL (ref 0.0–1.0)
pH: 6 (ref 5.0–8.0)

## 2014-04-08 LAB — URINE CULTURE
Colony Count: NO GROWTH
Organism ID, Bacteria: NO GROWTH

## 2014-04-19 ENCOUNTER — Encounter: Payer: Self-pay | Admitting: Internal Medicine

## 2014-04-19 ENCOUNTER — Ambulatory Visit (INDEPENDENT_AMBULATORY_CARE_PROVIDER_SITE_OTHER): Payer: BC Managed Care – PPO | Admitting: Internal Medicine

## 2014-04-19 VITALS — BP 126/82 | HR 76 | Temp 98.2°F | Resp 16 | Ht 71.0 in | Wt 222.0 lb

## 2014-04-19 DIAGNOSIS — B356 Tinea cruris: Secondary | ICD-10-CM

## 2014-04-19 MED ORDER — FLUCONAZOLE 100 MG PO TABS: ORAL_TABLET | ORAL | Status: DC

## 2014-04-19 NOTE — Progress Notes (Signed)
   Subjective:    Patient ID: Shaun [name], male    DOB: 12-Feb-1966, 48 y.o.   MRN: [id number]  HPI Patient c/o "jock Itch" and has failed to improve with various OTC creams and powders.  Meds/All/PMH - reviewed & unchanged  Review of Systems negative otherwise    Objective:   Physical Exam  BP 126/82  Pulse 76  Temp(Src) 98.2 F (36.8 C) (Temporal)  Resp 16  Ht  (1.803 m)  Wt 222 lb (100.699 kg)  BMI 30.98 kg/m2  Gu Exam shows an extensive pink to red scaly to moist rash of the inguino-crural-scrotal folds as well as the scrotum. No signs of cellulitis  Assessment & Plan:   1) Tinea Cruris - Rx Diflucan 100 mg #30 - 2 stat then 1 daily  (advised that prob will be cured after 10-14 days - Discussed Med Effects/SideEffects - ROV prn

## 2014-04-19 NOTE — Patient Instructions (Signed)
Candida Infection A Candida infection (also called yeast, fungus, and Monilia infection) is an overgrowth of yeast that can occur anywhere on the body. A yeast infection commonly occurs in warm, moist body areas. Usually, the infection remains localized but can spread to become a systemic infection. A yeast infection may be a sign of a more severe disease such as diabetes, leukemia, or AIDS. A yeast infection can occur in both men and women. In women, Candida vaginitis is a vaginal infection. It is one of the most common causes of vaginitis. Men usually do not have symptoms or know they have an infection until other problems develop. Men may find out they have a yeast infection because their sex partner has a yeast infection. Uncircumcised men are more likely to get a yeast infection than circumcised men. This is because the uncircumcised glans is not exposed to air and does not remain as dry as that of a circumcised glans. Older adults may develop yeast infections around dentures. CAUSES  Women  Antibiotics.  Steroid medication taken for a long time.  Being overweight (obese).  Diabetes.  Poor immune condition.  Certain serious medical conditions.  Immune suppressive medications for organ transplant patients.  Chemotherapy.  Pregnancy.  Menstruation.  Stress and fatigue.  Intravenous drug use.  Oral contraceptives.  Wearing tight-fitting clothes in the crotch area.  Catching it from a sex partner who has a yeast infection.  Spermicide.  Intravenous, urinary, or other catheters. Men  Catching it from a sex partner who has a yeast infection.  Having oral or anal sex with a person who has the infection.  Spermicide.  Diabetes.  Antibiotics.  Poor immune system.  Medications that suppress the immune system.  Intravenous drug use.  Intravenous, urinary, or other catheters. SYMPTOMS  Women  Thick, white vaginal discharge.  Vaginal itching.  Redness and  swelling in and around the vagina.  Irritation of the lips of the vagina and perineum.  Blisters on the vaginal lips and perineum.  Painful sexual intercourse.  Low blood sugar (hypoglycemia).  Painful urination.  Bladder infections.  Intestinal problems such as constipation, indigestion, bad breath, bloating, increase in gas, diarrhea, or loose stools. Men  Men may develop intestinal problems such as constipation, indigestion, bad breath, bloating, increase in gas, diarrhea, or loose stools.  Dry, cracked skin on the penis with itching or discomfort.  Jock itch.  Dry, flaky skin.  Athlete's foot.  Hypoglycemia. DIAGNOSIS  Women  A history and an exam are performed.  The discharge may be examined under a microscope.  A culture may be taken of the discharge. Men  A history and an exam are performed.  Any discharge from the penis or areas of cracked skin will be looked at under the microscope and cultured.  Stool samples may be cultured. TREATMENT  Women  Vaginal antifungal suppositories and creams.  Medicated creams to decrease irritation and itching on the outside of the vagina.  Warm compresses to the perineal area to decrease swelling and discomfort.  Oral antifungal medications.  Medicated vaginal suppositories or cream for repeated or recurrent infections.  Wash and dry the irritation areas before applying the cream.  Eating yogurt with Lactobacillus may help with prevention and treatment.  Sometimes painting the vagina with gentian violet solution may help if creams and suppositories do not work. Men  Antifungal creams and oral antifungal medications.  Sometimes treatment must continue for 30 days after the symptoms go away to prevent recurrence. HOME CARE INSTRUCTIONS    Women  Use cotton underwear and avoid tight-fitting clothing.  Avoid colored, scented toilet paper and deodorant tampons or pads.  Do not douche.  Keep your diabetes  under control.  Finish all the prescribed medications.  Keep your skin clean and dry.  Consume milk or yogurt with Lactobacillus-active culture regularly. If you get frequent yeast infections and think that is what the infection is, there are over-the-counter medications that you can get. If the infection does not show healing in 3 days, talk to your caregiver.  Tell your sex partner you have a yeast infection. Your partner may need treatment also, especially if your infection does not clear up or recurs. Men  Keep your skin clean and dry.  Keep your diabetes under control.  Finish all prescribed medications.  Tell your sex partner that you have a yeast infection so he or she can be treated if necessary. SEEK MEDICAL CARE IF:   Your symptoms do not clear up or worsen in one week after treatment.  You have an oral temperature above 102 F (38.9 C).  You have trouble swallowing or eating for a prolonged time.  You develop blisters on and around your vagina.  You develop vaginal bleeding and it is not your menstrual period.  You develop abdominal pain.  You develop intestinal problems as mentioned above.  You get weak or light-headed.  You have painful or increased urination.  You have pain during sexual intercourse. MAKE SURE YOU:   Understand these instructions.  Will watch your condition.  Will get help right away if you are not doing well or get worse. Document Released: 08/28/2004 Document Revised: 12/05/2013 Document Reviewed: 12/10/2009 [facility] Patient Information 2015 [place], [place]. This information is not intended to replace advice given to you by your health care provider. Make sure you discuss any questions you have with your health care provider.   Jock Itch Jock itch is a fungal infection of the skin in the groin area. It is sometimes called "ringworm" even though it is not caused by a worm. A fungus is a type of germ that thrives in dark, damp places.   CAUSES  This infection may spread from:  A fungus infection elsewhere on the body (such as athlete's foot).  Sharing towels or clothing. This infection is more common in:  Hot, humid climates.  People who wear tight-fitting clothing or wet bathing suits for long periods of time.  Athletes.  Overweight people.  People with diabetes. SYMPTOMS  Jock itch causes the following symptoms:  Red, pink or brown rash in the groin. Rash may spread to the thighs, anus, and buttocks.  Itching. DIAGNOSIS  Your caregiver may make the diagnosis by looking at the rash. Sometimes a skin scraping will be sent to test for fungus. Testing can be done either by looking under the microscope or by doing a culture (test to try to grow the fungus). A culture can take up to 2 weeks to come back. TREATMENT  Jock itch may be treated with:  Skin cream or ointment to kill fungus.  Medicine by mouth to kill fungus.  Skin cream or ointment to calm the itching.  Compresses or medicated powders to dry the infected skin. HOME CARE INSTRUCTIONS   Be sure to treat the rash completely. Follow your caregiver's instructions. It can take a couple of weeks to treat. If you do not treat the infection long enough, the rash can come back.  Wear loose-fitting clothing.  Men should wear cotton  boxer shorts.  Women should wear cotton underwear.  Avoid hot baths.  Dry the groin area well after bathing. SEEK MEDICAL CARE IF:   Your rash is worse.  Your rash is spreading.  Your rash returns after treatment is finished.  Your rash is not gone in 4 weeks. Fungal infections are slow to respond to treatment. Some redness may remain for several weeks after the fungus is gone. SEEK IMMEDIATE MEDICAL CARE IF:  The area becomes red, warm, tender, and swollen.  You have a fever. Document Released: 07/11/2002 Document Revised: 10/13/2011 Document Reviewed: 06/09/2008 [facility] Patient Information 2015 [place],  [place]. This information is not intended to replace advice given to you by your health care provider. Make sure you discuss any questions you have with your health care provider.

## 2014-05-23 ENCOUNTER — Ambulatory Visit: Payer: Self-pay | Admitting: Physician Assistant

## 2014-08-24 ENCOUNTER — Ambulatory Visit: Payer: BLUE CROSS/BLUE SHIELD | Admitting: Internal Medicine

## 2014-08-24 NOTE — Progress Notes (Signed)
Patient ID: Shaun [name], male   DOB: 1966-01-07, 49 y.o.   MRN: [id number]  R E [facility] E D

## 2014-08-30 ENCOUNTER — Encounter: Payer: Self-pay | Admitting: Internal Medicine

## 2014-08-31 ENCOUNTER — Encounter: Payer: Self-pay | Admitting: Internal Medicine

## 2014-09-26 ENCOUNTER — Encounter: Payer: Self-pay | Admitting: Internal Medicine

## 2014-09-26 ENCOUNTER — Encounter: Payer: Self-pay | Admitting: Physician Assistant

## 2014-09-26 DIAGNOSIS — B356 Tinea cruris: Secondary | ICD-10-CM

## 2014-09-27 MED ORDER — FLUCONAZOLE 100 MG PO TABS: ORAL_TABLET | ORAL | Status: AC

## 2014-12-21 ENCOUNTER — Ambulatory Visit (INDEPENDENT_AMBULATORY_CARE_PROVIDER_SITE_OTHER): Payer: BLUE CROSS/BLUE SHIELD | Admitting: Internal Medicine

## 2014-12-21 ENCOUNTER — Encounter: Payer: Self-pay | Admitting: Internal Medicine

## 2014-12-21 VITALS — BP 140/98 | HR 100 | Temp 98.0°F | Resp 18 | Ht 70.5 in | Wt 216.0 lb

## 2014-12-21 DIAGNOSIS — Z79899 Other long term (current) drug therapy: Secondary | ICD-10-CM

## 2014-12-21 DIAGNOSIS — E119 Type 2 diabetes mellitus without complications: Secondary | ICD-10-CM

## 2014-12-21 DIAGNOSIS — I10 Essential (primary) hypertension: Secondary | ICD-10-CM

## 2014-12-21 DIAGNOSIS — E559 Vitamin D deficiency, unspecified: Secondary | ICD-10-CM

## 2014-12-21 DIAGNOSIS — M25512 Pain in left shoulder: Secondary | ICD-10-CM

## 2014-12-21 DIAGNOSIS — E785 Hyperlipidemia, unspecified: Secondary | ICD-10-CM

## 2014-12-21 LAB — CBC WITH DIFFERENTIAL/PLATELET
Basophils Absolute: 0 10*3/uL (ref 0.0–0.1)
Basophils Relative: 0 % (ref 0–1)
Eosinophils Absolute: 0.2 10*3/uL (ref 0.0–0.7)
Eosinophils Relative: 3 % (ref 0–5)
HCT: 48.3 % (ref 39.0–52.0)
Hemoglobin: 16.9 g/dL (ref 13.0–17.0)
Lymphocytes Relative: 39 % (ref 12–46)
Lymphs Abs: 2.1 10*3/uL (ref 0.7–4.0)
MCH: 28.5 pg (ref 26.0–34.0)
MCHC: 35 g/dL (ref 30.0–36.0)
MCV: 81.5 fL (ref 78.0–100.0)
MPV: 9.7 fL (ref 8.6–12.4)
Monocytes Absolute: 0.4 10*3/uL (ref 0.1–1.0)
Monocytes Relative: 8 % (ref 3–12)
Neutro Abs: 2.7 10*3/uL (ref 1.7–7.7)
Neutrophils Relative %: 50 % (ref 43–77)
Platelets: 166 10*3/uL (ref 150–400)
RBC: 5.93 MIL/uL — ABNORMAL HIGH (ref 4.22–5.81)
RDW: 13.8 % (ref 11.5–15.5)
WBC: 5.4 10*3/uL (ref 4.0–10.5)

## 2014-12-21 LAB — BASIC METABOLIC PANEL WITH GFR
BUN: 17 mg/dL (ref 6–23)
CO2: 28 mEq/L (ref 19–32)
Calcium: 9.6 mg/dL (ref 8.4–10.5)
Chloride: 96 mEq/L (ref 96–112)
Creat: 0.57 mg/dL (ref 0.50–1.35)
GFR, Est African American: >89 mL/min
GFR, Est Non African American: >89 mL/min
Glucose, Bld: 261 mg/dL — ABNORMAL HIGH (ref 70–99)
Potassium: 4.4 mEq/L (ref 3.5–5.3)
Sodium: 135 mEq/L (ref 135–145)

## 2014-12-21 LAB — LIPID PANEL
Cholesterol: 159 mg/dL (ref 0–200)
HDL: 21 mg/dL — ABNORMAL LOW (ref >=40)
LDL Cholesterol: 61 mg/dL (ref 0–99)
Total CHOL/HDL Ratio: 7.6 Ratio
Triglycerides: 383 mg/dL — ABNORMAL HIGH (ref <150)
VLDL: 77 mg/dL — ABNORMAL HIGH (ref 0–40)

## 2014-12-21 LAB — HEPATIC FUNCTION PANEL
ALT: 14 U/L (ref 0–53)
AST: 12 U/L (ref 0–37)
Albumin: 4.2 g/dL (ref 3.5–5.2)
Alkaline Phosphatase: 82 U/L (ref 39–117)
Bilirubin, Direct: 0.1 mg/dL (ref 0.0–0.3)
Indirect Bilirubin: 0.6 mg/dL (ref 0.2–1.2)
Total Bilirubin: 0.7 mg/dL (ref 0.2–1.2)
Total Protein: 7 g/dL (ref 6.0–8.3)

## 2014-12-21 LAB — MAGNESIUM: Magnesium: 1.7 mg/dL (ref 1.5–2.5)

## 2014-12-21 MED ORDER — GABAPENTIN 300 MG PO CAPS: ORAL_CAPSULE | ORAL | Status: DC

## 2014-12-21 MED ORDER — FLUCONAZOLE 150 MG PO TABS: 150.0000 mg | ORAL_TABLET | Freq: Once | ORAL | Status: DC

## 2014-12-21 NOTE — Progress Notes (Signed)
Patient ID: Shaun [name], male   DOB: 12-06-1965, 49 y.o.   MRN: [id number]  Assessment and Plan:   1. Essential hypertension -monitor -elevated today -may need to consider adding another medication if it continues to be elevated after next visit -diet and exercise - TSH  2. Type 2 diabetes mellitus without complication -cont medicaitons - Hemoglobin A1c - Insulin, random  3. Hyperlipidemia -diet and exercise - Lipid panel  4. Medication management  - CBC with Differential/Platelet - BASIC METABOLIC PANEL WITH GFR - Hepatic function panel - Magnesium  5. Vitamin D deficiency -cont supplement - Vitamin D 1,25 dihydroxy  6.  Shoulder pain left -muscle spasm over the left trapezius and left subscapular area -marcaine injection given in trigger point -patient non-compliant and has not been in for visit for some time and is severe diabetic and needs to have sugars and A1C checked prior to being injected with steroid.   -recommend physical therapy which patient refuses -patient very unhappy with visit today due to me refusing injection.  DKA and possibility of death explained to patient as possible effects of injecting steroid with uncontrolled sugars.   -Lack of trigger point injection discussed with Dr. Oneta RackMcKeown who agrees with the plan    Continue diet and meds as discussed. Further disposition pending results of labs. Discussed med's effects and SE's.    HPI 49 y.o. male  presents for 3 month follow up with hypertension, hyperlipidemia, diabetes and vitamin D deficiency.   His blood pressure has been controlled at home, today their BP is BP: (!) 140/98 mmHg.He does workout.  He is very active at work he does a lot of lifting and walking for his job.   He denies chest pain, shortness of breath, dizziness.   He is not on cholesterol medication and denies myalgias. His cholesterol is at goal. The cholesterol was:  02/17/2014: Cholesterol, Total 152; HDL-C 27*; LDL (calc)  74; Triglycerides 253*   He has been working on diet and exercise for diabetes without complications, he is on bASA, he is not on ACE/ARB, and denies  foot ulcerations, hyperglycemia, hypoglycemia , increased appetite, nausea, paresthesia of the feet, polydipsia, polyuria, visual disturbances, vomiting and weight loss. Last A1C was: 02/17/2014: Hemoglobin-A1c 11.8*   Patient is on Vitamin D supplement. 02/17/2014: VITD 37  Patient also complains of left shoulder pain.  He reports that this has been going on for "years".  He has been seen by an orthopedist which told him he has a bulging disc.  He gets regular trigger point injections done here.  He has never done PT.    Current Medications:  Current Outpatient Prescriptions on File Prior to Visit  Medication Sig Dispense Refill  . atenolol (TENORMIN) 100 MG tablet Take 100 mg by mouth daily.    . Canagliflozin (INVOKANA) 300 MG TABS 1 daily for sugar 30 tablet 3  . cholecalciferol (VITAMIN D) 1000 UNITS tablet Take 4,000 Units by mouth daily.    . colchicine 0.6 MG tablet Take 1 tablet (0.6 mg total) by mouth 2 (two) times daily. For Gout 180 tablet 99  . gabapentin (NEURONTIN) 300 MG capsule 1-3 at night for pain 90 capsule 2  . glyBURIDE (DIABETA) 5 MG tablet Take 5 mg by mouth 3 (three) times daily with meals.    . metFORMIN (GLUCOPHAGE-XR) 500 MG 24 hr tablet Take 500 mg by mouth daily with breakfast. 2 pills twice daily with food    . Multiple Vitamins-Minerals (MULTIVITAL PO)  Take by mouth.     No current facility-administered medications on file prior to visit.   Medical History:  Past Medical History  Diagnosis Date  . Hypertension   . Hyperlipidemia   . Diabetes mellitus without complication   . GERD (gastroesophageal reflux disease)   . Asthma   . Gout   . Fatty liver disease, nonalcoholic   . Obesity   . Venous insufficiency   . Hypogonadism male    Allergies:  Allergies  Allergen Reactions  . Zocor [Simvastatin]      Headache  . Zoloft [Sertraline Hcl]     dysphona     Review of Systems:  Review of Systems  Constitutional: Negative for fever, chills and malaise/fatigue.  HENT: Negative for congestion, ear pain and sore throat.   Eyes: Negative.   Respiratory: Negative for cough, shortness of breath and wheezing.   Cardiovascular: Negative for chest pain, palpitations and leg swelling.  Gastrointestinal: Negative for heartburn, nausea, vomiting, diarrhea, constipation, blood in stool and melena.  Genitourinary: Negative for dysuria, urgency and frequency.  Skin: Negative.   Neurological: Positive for sensory change (down to left elbow and left shoulder blade). Negative for dizziness and headaches.  Endo/Heme/Allergies: Positive for polydipsia.  Psychiatric/Behavioral: Negative for depression. The patient is not nervous/anxious.     Family history- Review and unchanged  Social history- Review and unchanged  Physical Exam: BP 140/98 mmHg  Pulse 100  Temp(Src) 98 F (36.7 C) (Temporal)  Resp 18  Ht 5' 10.5" (1.791 m)  Wt 216 lb (97.977 kg)  BMI 30.54 kg/m2 Wt Readings from Last 3 Encounters:  12/21/14 216 lb (97.977 kg)  04/19/14 222 lb (100.699 kg)  02/17/14 220 lb (99.791 kg)   General Appearance: Well nourished well developed, non-toxic appearing, in no apparent distress. Eyes: PERRLA, EOMs, conjunctiva no swelling or erythema ENT/Mouth: Ear canals clear with no erythema, swelling, or discharge.  TMs normal bilaterally, oropharynx clear, moist, with no exudate.   Neck: Supple, thyroid normal, no JVD, no cervical adenopathy.  Respiratory: Respiratory effort normal, breath sounds clear A&P, no wheeze, rhonchi or rales noted.  No retractions, no accessory muscle usage Cardio: RRR with no MRGs. No noted edema.  Abdomen: Soft, + BS.  Non tender, no guarding, rebound, hernias, masses. Musculoskeletal: Full ROM, 5/5 strength, Normal gait Trigger points in the medial subscapular area  trapezius with palpable spasm. Skin: Warm, dry without rashes, lesions, ecchymosis.  Neuro: Awake and oriented X 3, Cranial nerves intact. No cerebellar symptoms.  Psych: normal affect, Insight and Judgment appropriate.    [name], Shaun Spratlin, PA-C 4:42 PM [facility]

## 2014-12-21 NOTE — Patient Instructions (Signed)
Muscle Cramps and Spasms Muscle cramps and spasms are when muscles tighten by themselves. They usually get better within minutes. Muscle cramps are painful. They are usually stronger and last longer than muscle spasms. Muscle spasms may or may not be painful. They can last a few seconds or much longer. HOME CARE  Drink enough fluid to keep your pee (urine) clear or pale yellow.  Massage, stretch, and relax the muscle.  Use a warm towel, heating pad, or warm shower water on tight muscles.  Place ice on the muscle if it is tender or in pain.  Put ice in a plastic bag.  Place a towel between your skin and the bag.  Leave the ice on for 15-20 minutes, 03-04 times a day.  Only take medicine as told by your doctor. GET HELP RIGHT AWAY IF:  Your cramps or spasms get worse, happen more often, or do not get better with time. MAKE SURE YOU:  Understand these instructions.  Will watch your condition.  Will get help right away if you are not doing well or get worse. Document Released: 07/03/2008 Document Revised: 11/15/2012 Document Reviewed: 07/07/2012 [employer] Patient Information 2015 [employer]. This information is not intended to replace advice given to you by your health care provider. Make sure you discuss any questions you have with your health care provider.  

## 2014-12-22 LAB — HEMOGLOBIN A1C
Hgb A1c MFr Bld: 12.1 % — ABNORMAL HIGH (ref <5.7)
Mean Plasma Glucose: 301 mg/dL — ABNORMAL HIGH (ref <117)

## 2014-12-22 LAB — INSULIN, RANDOM: Insulin: 7.8 u[IU]/mL (ref 2.0–19.6)

## 2014-12-24 LAB — VITAMIN D 1,25 DIHYDROXY
Vitamin D 1, 25 (OH)2 Total: 72 pg/mL (ref 18–72)
Vitamin D2 1, 25 (OH)2: <8 pg/mL
Vitamin D3 1, 25 (OH)2: 72 pg/mL

## 2014-12-28 ENCOUNTER — Encounter: Payer: Self-pay | Admitting: Internal Medicine

## 2014-12-28 ENCOUNTER — Other Ambulatory Visit: Payer: Self-pay | Admitting: Internal Medicine

## 2014-12-28 MED ORDER — FLUCONAZOLE 150 MG PO TABS: 150.0000 mg | ORAL_TABLET | Freq: Once | ORAL | Status: DC

## 2015-01-18 ENCOUNTER — Encounter: Payer: Self-pay | Admitting: *Deleted

## 2015-02-21 ENCOUNTER — Encounter: Payer: Self-pay | Admitting: Internal Medicine

## 2015-03-08 ENCOUNTER — Ambulatory Visit (INDEPENDENT_AMBULATORY_CARE_PROVIDER_SITE_OTHER): Payer: BLUE CROSS/BLUE SHIELD | Admitting: Internal Medicine

## 2015-03-08 ENCOUNTER — Encounter: Payer: Self-pay | Admitting: Internal Medicine

## 2015-03-08 VITALS — BP 146/102 | HR 80 | Temp 97.6°F | Resp 16 | Ht 70.5 in | Wt 218.6 lb

## 2015-03-08 DIAGNOSIS — Z125 Encounter for screening for malignant neoplasm of prostate: Secondary | ICD-10-CM | POA: Diagnosis not present

## 2015-03-08 DIAGNOSIS — J4520 Mild intermittent asthma, uncomplicated: Secondary | ICD-10-CM

## 2015-03-08 DIAGNOSIS — I10 Essential (primary) hypertension: Secondary | ICD-10-CM | POA: Diagnosis not present

## 2015-03-08 DIAGNOSIS — R5383 Other fatigue: Secondary | ICD-10-CM | POA: Diagnosis not present

## 2015-03-08 DIAGNOSIS — E119 Type 2 diabetes mellitus without complications: Secondary | ICD-10-CM | POA: Diagnosis not present

## 2015-03-08 DIAGNOSIS — E785 Hyperlipidemia, unspecified: Secondary | ICD-10-CM | POA: Diagnosis not present

## 2015-03-08 DIAGNOSIS — E559 Vitamin D deficiency, unspecified: Secondary | ICD-10-CM

## 2015-03-08 DIAGNOSIS — E291 Testicular hypofunction: Secondary | ICD-10-CM | POA: Diagnosis not present

## 2015-03-08 DIAGNOSIS — Z79899 Other long term (current) drug therapy: Secondary | ICD-10-CM

## 2015-03-08 DIAGNOSIS — Z111 Encounter for screening for respiratory tuberculosis: Secondary | ICD-10-CM | POA: Diagnosis not present

## 2015-03-08 DIAGNOSIS — Z1212 Encounter for screening for malignant neoplasm of rectum: Secondary | ICD-10-CM

## 2015-03-08 DIAGNOSIS — K2100 Gastro-esophageal reflux disease with esophagitis, without bleeding: Secondary | ICD-10-CM

## 2015-03-08 DIAGNOSIS — E669 Obesity, unspecified: Secondary | ICD-10-CM | POA: Insufficient documentation

## 2015-03-08 DIAGNOSIS — Z6830 Body mass index (BMI) 30.0-30.9, adult: Secondary | ICD-10-CM

## 2015-03-08 DIAGNOSIS — M1000 Idiopathic gout, unspecified site: Secondary | ICD-10-CM | POA: Diagnosis not present

## 2015-03-08 MED ORDER — FLUCONAZOLE 150 MG PO TABS: ORAL_TABLET | ORAL | Status: DC

## 2015-03-08 NOTE — Progress Notes (Signed)
Patient ID: Shaun [name], male   DOB: 1965-10-11, 49 y.o.   MRN: [id number]    Comprehensive Examination  This very nice 49 y.o. [name] presents for complete physical.  Patient has been followed for HTN, T2_NIDDM , Hyperlipidemia, and Vitamin D Deficiency.   HTN predates since 1989. Patient's BP has been controlled at home.Today's BP: (!) 146/102 mmHg. Patient denies any cardiac symptoms as chest pain, palpitations, shortness of breath, dizziness or ankle swelling.   Patient's hyperlipidemia is controlled with diet and medications. Patient denies myalgias or other medication SE's. Last lipids were at goal - Cholesterol 159; HDL 21*; LDL 61; and with elevated Triglycerides 383 on 12/21/2014.   Patient still has Morbid Obesity (BMI 30.54) despite loss of  78 # over the last 10 years with improved dietary habits. His T2_NIDDM was dx'd  2004 and patient denies reactive hypoglycemic symptoms, visual blurring, diabetic polys or paresthesias. Patient's Last A1c was not at goal at A1c 12.1% on 12/21/2014.   Finally, patient has history of Vitamin D Deficiency of 15 in 2009 and last vitamin D was 37 in July 2015.      Medication Sig  . atenolol  100 MG tablet Take 100 mg by mouth daily.  . Canagliflozin (INVOKANA) 300 MG TABS 1 daily for sugar  . VITAMIN D 1000 UNITS tablet Take 4,000 Units by mouth daily.  Marland Kitchen gabapentin  300 MG capsule 1-3 at night for pain  . glyBURIDE  5 MG tablet Take 5 mg by mouth 3 (three) times daily with meals.  . metFORMIN -XR 500 MG 24 hr tablet Take 500 mg by mouth daily with breakfast. 2 pills twice daily with food  . Multiple Vitamins-Minerals  Take by mouth.   Allergies  Allergen Reactions  . Zocor [Simvastatin]     Headache  . Zoloft [Sertraline Hcl]     dysphona   Past Medical History  Diagnosis Date  . Hypertension   . Hyperlipidemia   . Diabetes mellitus without complication   . GERD (gastroesophageal reflux disease)   . Asthma   . Gout   . Fatty liver  disease, nonalcoholic   . Obesity   . Venous insufficiency   . Hypogonadism male    Health Maintenance  Topic Date Due  . OPHTHALMOLOGY EXAM  06/27/1975  . FOOT EXAM  02/18/2015  . URINE MICROALBUMIN  02/18/2015  . INFLUENZA VACCINE  03/05/2015  . HEMOGLOBIN A1C  06/23/2015  . PNEUMOCOCCAL POLYSACCHARIDE VACCINE (2) 02/09/2018  . TETANUS/TDAP  02/10/2023  . HIV Screening  Completed   Immunization History  Administered Date(s) Administered  . PPD Test 02/17/2014, 03/08/2015  . Pneumococcal-Unspecified 02/09/2013  . Tdap 02/09/2013   Past Surgical History  Procedure Laterality Date  . Anterior cruciate ligament repair Left 2001  . Knee arthroscopy Right 2002, 1995  . Cystoscopy  1993  . Esophagogastroduodenoscopy  1995, 2002    gastritis, esophagititis   Family History  Problem Relation Age of Onset  . Diabetes Mother   . Hypertension Mother   . Gout Mother   . COPD Father   . Cancer Sister 73    appendix  . Gout Brother   . Hyperlipidemia Brother   . Hypertension Brother   . Hypertension Brother   . Hyperlipidemia Brother   . Gout Brother   . Diabetes Brother   . COPD Sister    History   Social History  . Marital Status: Divorced    Spouse Name: N/A  . Number  of Children: N/A  . Years of Education: N/A   Occupational History  . Not on file.   Social History Main Topics  . Smoking status: Former Smoker -- 1.00 packs/day for 15 years    Types: Cigarettes    Quit date: 08/07/2000  . Smokeless tobacco: Never Used  . Alcohol Use: Yes     Comment: social  . Drug Use: No  . Sexual Activity: Active    ROS Constitutional: Denies fever, chills, weight loss/gain, headaches, insomnia,  night sweats or change in appetite. Does c/o fatigue. Eyes: Denies redness, blurred vision, diplopia, discharge, itchy or watery eyes.  ENT: Denies discharge, congestion, post nasal drip, epistaxis, sore throat, earache, hearing loss, dental pain, Tinnitus, Vertigo, Sinus pain  or snoring.  Cardio: Denies chest pain, palpitations, irregular heartbeat, syncope, dyspnea, diaphoresis, orthopnea, PND, claudication or edema Respiratory: denies cough, dyspnea, DOE, pleurisy, hoarseness, laryngitis or wheezing.  Gastrointestinal: Denies dysphagia, heartburn, reflux, water brash, pain, cramps, nausea, vomiting, bloating, diarrhea, constipation, hematemesis, melena, hematochezia, jaundice or hemorrhoids Genitourinary: Denies dysuria, frequency, urgency, nocturia, hesitancy, discharge, hematuria or flank pain Musculoskeletal: Denies arthralgia, myalgia, stiffness, Jt. Swelling, pain, limp or strain/sprain. Denies Falls. Skin: Denies puritis, rash, hives, warts, acne, eczema or change in skin lesion Neuro: No weakness, tremor, incoordination, spasms, paresthesia or pain Psychiatric: Denies confusion, memory loss or sensory loss. Denies Depression. Endocrine: Denies change in weight, skin, hair change, nocturia, and paresthesia, diabetic polys, visual blurring or hyper / hypo glycemic episodes.  Heme/Lymph: No excessive bleeding, bruising or enlarged lymph nodes.  Physical Exam  BP 146/102   Pulse 80  Tem 97.6 F   Resp 16  Ht 5' 10.5"  Wt 218 lb 9.6 oz     BMI 30.91  General Appearance: Well nourished, in no apparent distress. Eyes: PERRLA, EOMs, conjunctiva no swelling or erythema, normal fundi and vessels. Sinuses: No frontal/maxillary tenderness ENT/Mouth: EACs patent / TMs  nl. Nares clear without erythema, swelling, mucoid exudates. Oral hygiene is good. No erythema, swelling, or exudate. Tongue normal, non-obstructing. Tonsils not swollen or erythematous. Hearing normal.  Neck: Supple, thyroid normal. No bruits, nodes or JVD. Respiratory: Respiratory effort normal.  BS equal and clear bilateral without rales, rhonci, wheezing or stridor. Cardio: Heart sounds are normal with regular rate and rhythm and no murmurs, rubs or gallops. Peripheral pulses are normal and  equal bilaterally without edema. No aortic or femoral bruits. Chest: symmetric with normal excursions and percussion.  Abdomen: Flat, soft, with bowel sounds. Nontender, no guarding, rebound, hernias, masses, or organomegaly.  Lymphatics: Non tender without lymphadenopathy.  Genitourinary: No hernias.Testes nl. DRE - prostate nl for age - smooth & firm w/o nodules. Musculoskeletal: Full ROM all peripheral extremities, joint stability, 5/5 strength, and normal gait. Skin: Warm and dry without rashes, lesions, cyanosis, clubbing or  ecchymosis.  Neuro: Cranial nerves intact, reflexes equal bilaterally. Normal muscle tone, no cerebellar symptoms. Sensation intact.  Pysch: Awake and oriented X 3 with normal affect, insight and judgment appropriate.   Assessment and Plan  1. Essential hypertension   2. Hyperlipidemia   3. T2_NIDDM   4. Vitamin D deficiency   5. Idiopathic gout   6. GERD   7. Asthma   8. Hypogonadism male   88. Screening for rectal cancer   10. Prostate cancer screening   11. Other fatigue   12. Medication management   13. Morbid obesity (BMI 30.54)   Continue prudent diet as discussed, weight control, BP monitoring, regular exercise, and medications  as discussed.  Discussed med effects and SE's. Routine screening labs and tests as requested with regular follow-up as recommended.  Over 40 minutes of exam, counseling &  chart review was performed

## 2015-03-08 NOTE — Patient Instructions (Signed)

## 2015-03-09 LAB — CBC WITH DIFFERENTIAL/PLATELET
Basophils Absolute: 0 10*3/uL (ref 0.0–0.1)
Basophils Relative: 0 % (ref 0–1)
Eosinophils Absolute: 0.2 10*3/uL (ref 0.0–0.7)
Eosinophils Relative: 4 % (ref 0–5)
HCT: 48.7 % (ref 39.0–52.0)
Hemoglobin: 17 g/dL (ref 13.0–17.0)
Lymphocytes Relative: 45 % (ref 12–46)
Lymphs Abs: 2.5 10*3/uL (ref 0.7–4.0)
MCH: 28.4 pg (ref 26.0–34.0)
MCHC: 34.9 g/dL (ref 30.0–36.0)
MCV: 81.4 fL (ref 78.0–100.0)
MPV: 9.8 fL (ref 8.6–12.4)
Monocytes Absolute: 0.3 10*3/uL (ref 0.1–1.0)
Monocytes Relative: 6 % (ref 3–12)
Neutro Abs: 2.5 10*3/uL (ref 1.7–7.7)
Neutrophils Relative %: 45 % (ref 43–77)
Platelets: 179 10*3/uL (ref 150–400)
RBC: 5.98 MIL/uL — ABNORMAL HIGH (ref 4.22–5.81)
RDW: 13.8 % (ref 11.5–15.5)
WBC: 5.5 10*3/uL (ref 4.0–10.5)

## 2015-03-09 LAB — BASIC METABOLIC PANEL WITH GFR
BUN: 13 mg/dL (ref 7–25)
CO2: 28 mmol/L (ref 20–31)
Calcium: 10.1 mg/dL (ref 8.6–10.3)
Chloride: 95 mmol/L — ABNORMAL LOW (ref 98–110)
Creat: 0.66 mg/dL (ref 0.60–1.35)
GFR, Est African American: >89 mL/min (ref >=60)
GFR, Est Non African American: >89 mL/min (ref >=60)
Glucose, Bld: 266 mg/dL — ABNORMAL HIGH (ref 65–99)
Potassium: 4.7 mmol/L (ref 3.5–5.3)
Sodium: 134 mmol/L — ABNORMAL LOW (ref 135–146)

## 2015-03-09 LAB — URINALYSIS, MICROSCOPIC ONLY
Bacteria, UA: NONE SEEN [HPF]
Casts: NONE SEEN [LPF]
Crystals: NONE SEEN [HPF]
RBC / HPF: NONE SEEN RBC/HPF (ref <=2)
Squamous Epithelial / LPF: NONE SEEN [HPF] (ref <=5)
WBC, UA: NONE SEEN WBC/HPF (ref <=5)
Yeast: NONE SEEN [HPF]

## 2015-03-09 LAB — LIPID PANEL
Cholesterol: 192 mg/dL (ref 125–200)
HDL: 26 mg/dL — ABNORMAL LOW (ref >=40)
LDL Cholesterol: 97 mg/dL (ref <130)
Total CHOL/HDL Ratio: 7.4 Ratio — ABNORMAL HIGH (ref <=5.0)
Triglycerides: 347 mg/dL — ABNORMAL HIGH (ref <150)
VLDL: 69 mg/dL — ABNORMAL HIGH (ref <30)

## 2015-03-09 LAB — HEPATIC FUNCTION PANEL
ALT: 13 U/L (ref 9–46)
AST: 11 U/L (ref 10–40)
Albumin: 4.4 g/dL (ref 3.6–5.1)
Alkaline Phosphatase: 72 U/L (ref 40–115)
Bilirubin, Direct: 0.2 mg/dL (ref <=0.2)
Indirect Bilirubin: 0.7 mg/dL (ref 0.2–1.2)
Total Bilirubin: 0.9 mg/dL (ref 0.2–1.2)
Total Protein: 6.8 g/dL (ref 6.1–8.1)

## 2015-03-09 LAB — URIC ACID: Uric Acid, Serum: 7.5 mg/dL (ref 4.0–7.8)

## 2015-03-09 LAB — INSULIN, RANDOM: Insulin: 9.3 u[IU]/mL (ref 2.0–19.6)

## 2015-03-09 LAB — MAGNESIUM: Magnesium: 1.7 mg/dL (ref 1.5–2.5)

## 2015-03-09 LAB — HEMOGLOBIN A1C
Hgb A1c MFr Bld: 12 % — ABNORMAL HIGH (ref <5.7)
Mean Plasma Glucose: 298 mg/dL — ABNORMAL HIGH (ref <117)

## 2015-03-09 LAB — PSA: PSA: 0.12 ng/mL (ref <=4.00)

## 2015-03-09 LAB — IRON AND TIBC
%SAT: 37 % (ref 20–55)
Iron: 138 ug/dL (ref 42–165)
TIBC: 371 ug/dL (ref 215–435)
UIBC: 233 ug/dL (ref 125–400)

## 2015-03-09 LAB — MICROALBUMIN / CREATININE URINE RATIO
Creatinine, Urine: 36.2 mg/dL
Microalb Creat Ratio: 5.5 mg/g (ref 0.0–30.0)
Microalb, Ur: 0.2 mg/dL (ref <2.0)

## 2015-03-09 LAB — TESTOSTERONE: Testosterone: 227 ng/dL — ABNORMAL LOW (ref 300–890)

## 2015-03-09 LAB — VITAMIN D 25 HYDROXY (VIT D DEFICIENCY, FRACTURES): Vit D, 25-Hydroxy: 23 ng/mL — ABNORMAL LOW (ref 30–100)

## 2015-03-09 LAB — TSH: TSH: 1.815 u[IU]/mL (ref 0.350–4.500)

## 2015-03-09 LAB — VITAMIN B12: Vitamin B-12: 656 pg/mL (ref 211–911)

## 2015-03-11 ENCOUNTER — Other Ambulatory Visit: Payer: Self-pay | Admitting: Internal Medicine

## 2015-03-11 DIAGNOSIS — E119 Type 2 diabetes mellitus without complications: Secondary | ICD-10-CM

## 2015-03-11 MED ORDER — SITAGLIPTIN PHOSPHATE 100 MG PO TABS: 100.0000 mg | ORAL_TABLET | Freq: Every day | ORAL | Status: DC

## 2015-03-22 ENCOUNTER — Other Ambulatory Visit: Payer: Self-pay | Admitting: Physician Assistant

## 2015-03-22 ENCOUNTER — Encounter: Payer: Self-pay | Admitting: Internal Medicine

## 2015-03-22 MED ORDER — METFORMIN HCL ER 500 MG PO TB24: 500.0000 mg | ORAL_TABLET | Freq: Every day | ORAL | Status: DC

## 2015-03-22 MED ORDER — GLYBURIDE 5 MG PO TABS: 5.0000 mg | ORAL_TABLET | Freq: Three times a day (TID) | ORAL | Status: DC

## 2015-03-22 MED ORDER — METFORMIN HCL ER 500 MG PO TB24: ORAL_TABLET | ORAL | Status: DC

## 2015-03-22 MED ORDER — ATENOLOL 100 MG PO TABS: 100.0000 mg | ORAL_TABLET | Freq: Every day | ORAL | Status: DC

## 2015-06-20 ENCOUNTER — Encounter: Payer: Self-pay | Admitting: Physician Assistant

## 2015-06-20 ENCOUNTER — Ambulatory Visit (INDEPENDENT_AMBULATORY_CARE_PROVIDER_SITE_OTHER): Payer: BLUE CROSS/BLUE SHIELD | Admitting: Physician Assistant

## 2015-06-20 VITALS — BP 126/80 | HR 67 | Temp 97.7°F | Resp 16 | Ht 70.5 in | Wt 234.0 lb

## 2015-06-20 DIAGNOSIS — M7701 Medial epicondylitis, right elbow: Secondary | ICD-10-CM | POA: Diagnosis not present

## 2015-06-20 DIAGNOSIS — E559 Vitamin D deficiency, unspecified: Secondary | ICD-10-CM

## 2015-06-20 DIAGNOSIS — E785 Hyperlipidemia, unspecified: Secondary | ICD-10-CM | POA: Diagnosis not present

## 2015-06-20 DIAGNOSIS — E119 Type 2 diabetes mellitus without complications: Secondary | ICD-10-CM

## 2015-06-20 DIAGNOSIS — I10 Essential (primary) hypertension: Secondary | ICD-10-CM

## 2015-06-20 DIAGNOSIS — M1000 Idiopathic gout, unspecified site: Secondary | ICD-10-CM

## 2015-06-20 DIAGNOSIS — Z79899 Other long term (current) drug therapy: Secondary | ICD-10-CM | POA: Diagnosis not present

## 2015-06-20 MED ORDER — MELOXICAM 15 MG PO TABS: ORAL_TABLET | ORAL | Status: DC

## 2015-06-20 NOTE — Progress Notes (Signed)
Assessment and Plan:  1. Hypertension -Continue medication, monitor blood pressure at home. Continue DASH diet.  Reminder to go to the ER if any CP, SOB, nausea, dizziness, severe HA, changes vision/speech, left arm numbness and tingling and jaw pain.  2. Cholesterol -Continue diet and exercise. Check cholesterol.   3. Diabetes without complications -Continue diet and exercise. Check A1C Has been on meds x 3 months ? parcreatic shock verus will become insulin dependent- if A1c still elevated, likely will need insulin to bring down sugars. Patient is agreeable, will bring back if A1c is not better.   4. Vitamin D Def - check level and continue medications.   5. Right medial epicondyle pain Will refer to ortho, Mobic given, explained unable to give infection with steroid due to uncontrolled sugars and risk of DKA  Continue diet and meds as discussed. Further disposition pending results of labs. Discussed med's effects and SE's.    Over 30 minutes of exam, counseling, chart review, and critical decision making was performed   HPI 49 y.o. male  presents for 3 month follow up on hypertension, cholesterol, diabetes and vitamin D deficiency.   His blood pressure has been controlled at home, today his BP is BP: 126/80 mmHg.  He does not workout. He denies chest pain, shortness of breath, dizziness.  He is not on cholesterol medication and denies myalgias. His cholesterol is at goal. The cholesterol was:   Lab Results  Component Value Date   CHOL 192 03/08/2015   HDL 26* 03/08/2015   LDLCALC 97 03/08/2015   TRIG 347* 03/08/2015   CHOLHDL 7.4* 03/08/2015    He has been working on diet and exercise for diabetes without complications, he is on bASA, he is not on ACE/ARB, and denies  paresthesia of the feet, polydipsia, polyuria and visual disturbances. Last A1C was:  Lab Results  Component Value Date   HGBA1C 12.0* 03/08/2015  Last GFR:  Lab Results  Component Value Date   GFRNONAA >89  03/08/2015   Patient is on Vitamin D supplement. Lab Results  Component Value Date   VD25OH 23* 03/08/2015     Current Medications:  Current Outpatient Prescriptions on File Prior to Visit  Medication Sig Dispense Refill  . atenolol (TENORMIN) 100 MG tablet Take 1 tablet (100 mg total) by mouth daily. 90 tablet 0  . fluconazole (DIFLUCAN) 150 MG tablet Take 1 tablet weekly for yeast infection. 5 tablet 5  . gabapentin (NEURONTIN) 300 MG capsule 1-3 at night for pain 90 capsule 2  . glyBURIDE (DIABETA) 5 MG tablet Take 1 tablet (5 mg total) by mouth 3 (three) times daily with meals. 270 tablet 0  . metFORMIN (GLUCOPHAGE-XR) 500 MG 24 hr tablet 2 pills twice daily with food 360 tablet 0  . sitaGLIPtin (JANUVIA) 100 MG tablet Take 1 tablet (100 mg total) by mouth daily. 30 tablet 5   No current facility-administered medications on file prior to visit.   Medical History:  Past Medical History  Diagnosis Date  . Hypertension   . Hyperlipidemia   . Diabetes mellitus without complication (HCC)   . GERD (gastroesophageal reflux disease)   . Asthma   . Gout   . Fatty liver disease, nonalcoholic   . Obesity   . Venous insufficiency   . Hypogonadism male    Allergies:  Allergies  Allergen Reactions  . Zocor [Simvastatin]     Headache  . Zoloft [Sertraline Hcl]     dysphona  Review of Systems:  Review of Systems  Constitutional: Negative for fever, chills and malaise/fatigue.  HENT: Negative for congestion, ear pain and sore throat.   Eyes: Negative.   Respiratory: Negative for cough, shortness of breath and wheezing.   Cardiovascular: Negative for chest pain, palpitations and leg swelling.  Gastrointestinal: Negative for heartburn, nausea, vomiting, diarrhea, constipation, blood in stool and melena.  Genitourinary: Negative for dysuria, urgency and frequency.  Musculoskeletal: Positive for joint pain (right elbow). Negative for myalgias, back pain, falls and neck pain.   Skin: Negative.   Neurological: Negative for dizziness, sensory change and headaches.  Endo/Heme/Allergies: Positive for polydipsia.  Psychiatric/Behavioral: Negative for depression. The patient is not nervous/anxious.     Family history- Review and unchanged Social history- Review and unchanged Physical Exam: BP 126/80 mmHg  Pulse 67  Temp(Src) 97.7 F (36.5 C) (Temporal)  Resp 16  Ht 5' 10.5" (1.791 m)  Wt 234 lb (106.142 kg)  BMI 33.09 kg/m2  SpO2 98% Wt Readings from Last 3 Encounters:  06/20/15 234 lb (106.142 kg)  03/08/15 218 lb 9.6 oz (99.156 kg)  12/21/14 216 lb (97.977 kg)   General Appearance: Well nourished, in no apparent distress. Eyes: PERRLA, EOMs, conjunctiva no swelling or erythema Sinuses: No Frontal/maxillary tenderness ENT/Mouth: Ext aud canals clear, TMs without erythema, bulging. No erythema, swelling, or exudate on post pharynx.  Tonsils not swollen or erythematous. Hearing normal.  Neck: Supple, thyroid normal.  Respiratory: Respiratory effort normal, BS equal bilaterally without rales, rhonchi, wheezing or stridor.  Cardio: RRR with no MRGs. Brisk peripheral pulses without edema.  Abdomen: Soft, + BS.  Non tender, no guarding, rebound, hernias, masses. Lymphatics: Non tender without lymphadenopathy.  Musculoskeletal: Full ROM, 5/5 strength, Normal gait. No warmth, swelling, redness. Pain with flexion and extension of right elbow, pin point tenderness at medial right epicondyle.  Skin: Warm, dry without rashes, lesions, ecchymosis.  Neuro: Cranial nerves intact. No cerebellar symptoms.  Psych: Awake and oriented X 3, normal affect, Insight and Judgment appropriate.    Shaun MullingAmanda Jihaad Bruschi, PA-C 4:35 PM [facility]

## 2015-06-20 NOTE — Patient Instructions (Signed)
Diabetes is a very complicated disease...lets simplify it.  An easy way to look at it to understand the complications is if you think of the extra sugar floating in your blood stream as glass shards floating through your blood stream.    Diabetes affects your small vessels first: 1) The glass shards (sugar) scraps down the tiny blood vessels in your eyes and lead to diabetic retinopathy, the leading cause of blindness in the [place]. Diabetes is the leading cause of newly diagnosed adult (43 to 50 years of age) blindness in the [place].  2) The glass shards scratches down the tiny vessels of your legs leading to nerve damage called neuropathy and can lead to amputations of your feet. More than 60% of all non-traumatic amputations of lower limbs occur in people with diabetes.  3) Over time the small vessels in your brain are shredded and closed off, individually this does not cause any problems but over a long period of time many of the small vessels being blocked can lead to Vascular Dementia.   4) Your kidney's are a filter system and have a "net" that keeps certain things in the body and lets bad things out. Sugar shreds this net and leads to kidney damage and eventually failure. Decreasing the sugar that is destroying the net and certain blood pressure medications can help stop or decrease progression of kidney disease. Diabetes was the primary cause of kidney failure in 44 percent of all new cases in 2011.  5) Diabetes also destroys the small vessels in your penis that lead to erectile dysfunction. Eventually the vessels are so damaged that you may not be responsive to cialis or viagra.   Diabetes and your large vessels: Your larger vessels consist of your coronary arteries in your heart and the carotid vessels to your brain. Diabetes or even increased sugars put you at 300% increased risk of heart attack and stroke and this is why.. The sugar scrapes down your large blood vessels and your body  sees this as an internal injury and tries to repair itself. Just like you get a scab on your skin, your platelets will stick to the blood vessel wall trying to heal it. This is why we have diabetics on low dose aspirin daily, this prevents the platelets from sticking and can prevent plaque formation. In addition, your body takes cholesterol and tries to shove it into the open wound. This is why we want your LDL, or bad cholesterol, below 70.   The combination of platelets and cholesterol over 5-10 years forms plaque that can break off and cause a heart attack or stroke.   PLEASE REMEMBER:  Diabetes is preventable! Up to 59 percent of complications and morbidities among individuals with type 2 diabetes can be prevented, delayed, or effectively treated and minimized with regular visits to a health professional, appropriate monitoring and medication, and a healthy diet and lifestyle.     Bad carbs also include fruit juice, alcohol, and sweet tea. These are empty calories that do not signal to your brain that you are full.   Please remember the good carbs are still carbs which convert into sugar. So please measure them out no more than 1/2-1 cup of rice, oatmeal, pasta, and beans  Veggies are however free foods! Pile them on.   Not all fruit is created equal. Please see the list below, the fruit at the bottom is higher in sugars than the fruit at the top. Please avoid all dried fruits.  Your A1C is a measure of your sugar over the past 3 months and is not affected by what you have eaten over the past few days. Diabetes increases your chances of stroke and heart attack over 300 % and is the leading cause of blindness and kidney failure in the [place]. Please make sure you decrease bad carbs like white bread, white rice, potatoes, corn, soft drinks, pasta, cereals, refined sugars, sweet tea, dried fruits, and fruit juice. Good carbs are okay to eat in moderation like sweet potatoes, brown  rice, whole grain pasta/bread, most fruit (except dried fruit) and you can eat as many veggies as you want.   Greater than 6.5 is considered diabetic. Between 6.4 and 5.7 is prediabetic If your A1C is less than 5.7 you are NOT diabetic.  Targets for Glucose Readings: Time of Check Target for patients WITHOUT Diabetes Target for DIABETICS  Before Meals Less than 100  less than 150  Two hours after meals Less than 200  Less than 250

## 2015-06-21 LAB — CBC WITH DIFFERENTIAL/PLATELET
Basophils Absolute: 0 10*3/uL (ref 0.0–0.1)
Basophils Relative: 0 % (ref 0–1)
Eosinophils Absolute: 0.2 10*3/uL (ref 0.0–0.7)
Eosinophils Relative: 3 % (ref 0–5)
HCT: 46.9 % (ref 39.0–52.0)
Hemoglobin: 16.8 g/dL (ref 13.0–17.0)
Lymphocytes Relative: 51 % — ABNORMAL HIGH (ref 12–46)
Lymphs Abs: 3.5 10*3/uL (ref 0.7–4.0)
MCH: 29.1 pg (ref 26.0–34.0)
MCHC: 35.8 g/dL (ref 30.0–36.0)
MCV: 81.3 fL (ref 78.0–100.0)
MPV: 9.8 fL (ref 8.6–12.4)
Monocytes Absolute: 0.4 10*3/uL (ref 0.1–1.0)
Monocytes Relative: 6 % (ref 3–12)
Neutro Abs: 2.8 10*3/uL (ref 1.7–7.7)
Neutrophils Relative %: 40 % — ABNORMAL LOW (ref 43–77)
Platelets: 221 10*3/uL (ref 150–400)
RBC: 5.77 MIL/uL (ref 4.22–5.81)
RDW: 14.6 % (ref 11.5–15.5)
WBC: 6.9 10*3/uL (ref 4.0–10.5)

## 2015-06-21 LAB — BASIC METABOLIC PANEL WITH GFR
BUN: 15 mg/dL (ref 7–25)
CO2: 27 mmol/L (ref 20–31)
Calcium: 9.8 mg/dL (ref 8.6–10.3)
Chloride: 97 mmol/L — ABNORMAL LOW (ref 98–110)
Creat: 0.83 mg/dL (ref 0.60–1.35)
GFR, Est African American: >89 mL/min (ref >=60)
GFR, Est Non African American: >89 mL/min (ref >=60)
Glucose, Bld: 221 mg/dL — ABNORMAL HIGH (ref 65–99)
Potassium: 4.6 mmol/L (ref 3.5–5.3)
Sodium: 135 mmol/L (ref 135–146)

## 2015-06-21 LAB — HEPATIC FUNCTION PANEL
ALT: 21 U/L (ref 9–46)
AST: 15 U/L (ref 10–40)
Albumin: 4.7 g/dL (ref 3.6–5.1)
Alkaline Phosphatase: 54 U/L (ref 40–115)
Bilirubin, Direct: 0.1 mg/dL (ref <=0.2)
Indirect Bilirubin: 0.6 mg/dL (ref 0.2–1.2)
Total Bilirubin: 0.7 mg/dL (ref 0.2–1.2)
Total Protein: 7.3 g/dL (ref 6.1–8.1)

## 2015-06-21 LAB — TSH: TSH: 2.964 u[IU]/mL (ref 0.350–4.500)

## 2015-06-21 LAB — MAGNESIUM: Magnesium: 1.8 mg/dL (ref 1.5–2.5)

## 2015-06-21 LAB — LIPID PANEL
Cholesterol: 178 mg/dL (ref 125–200)
HDL: 19 mg/dL — ABNORMAL LOW (ref >=40)
Total CHOL/HDL Ratio: 9.4 Ratio — ABNORMAL HIGH (ref <=5.0)
Triglycerides: 427 mg/dL — ABNORMAL HIGH (ref <150)

## 2015-06-21 LAB — VITAMIN D 25 HYDROXY (VIT D DEFICIENCY, FRACTURES): Vit D, 25-Hydroxy: 22 ng/mL — ABNORMAL LOW (ref 30–100)

## 2015-06-21 LAB — HEMOGLOBIN A1C
Hgb A1c MFr Bld: 9.3 % — ABNORMAL HIGH (ref <5.7)
Mean Plasma Glucose: 220 mg/dL — ABNORMAL HIGH (ref <117)

## 2015-06-21 LAB — URIC ACID: Uric Acid, Serum: 6.2 mg/dL (ref 4.0–7.8)

## 2015-06-22 ENCOUNTER — Other Ambulatory Visit: Payer: Self-pay | Admitting: Physician Assistant

## 2015-07-26 ENCOUNTER — Other Ambulatory Visit: Payer: Self-pay | Admitting: Physician Assistant

## 2015-09-26 ENCOUNTER — Other Ambulatory Visit: Payer: Self-pay | Admitting: *Deleted

## 2015-09-26 ENCOUNTER — Encounter: Payer: Self-pay | Admitting: Internal Medicine

## 2015-09-26 ENCOUNTER — Ambulatory Visit (INDEPENDENT_AMBULATORY_CARE_PROVIDER_SITE_OTHER): Payer: BLUE CROSS/BLUE SHIELD | Admitting: Internal Medicine

## 2015-09-26 VITALS — BP 140/94 | HR 76 | Temp 97.0°F | Resp 16 | Ht 70.5 in | Wt 237.8 lb

## 2015-09-26 DIAGNOSIS — Z79899 Other long term (current) drug therapy: Secondary | ICD-10-CM

## 2015-09-26 DIAGNOSIS — E119 Type 2 diabetes mellitus without complications: Secondary | ICD-10-CM

## 2015-09-26 DIAGNOSIS — I10 Essential (primary) hypertension: Secondary | ICD-10-CM

## 2015-09-26 DIAGNOSIS — E559 Vitamin D deficiency, unspecified: Secondary | ICD-10-CM

## 2015-09-26 DIAGNOSIS — M1000 Idiopathic gout, unspecified site: Secondary | ICD-10-CM | POA: Diagnosis not present

## 2015-09-26 DIAGNOSIS — E785 Hyperlipidemia, unspecified: Secondary | ICD-10-CM

## 2015-09-26 DIAGNOSIS — E349 Endocrine disorder, unspecified: Secondary | ICD-10-CM

## 2015-09-26 DIAGNOSIS — E291 Testicular hypofunction: Secondary | ICD-10-CM

## 2015-09-26 MED ORDER — SITAGLIPTIN PHOSPHATE 100 MG PO TABS: 100.0000 mg | ORAL_TABLET | Freq: Every day | ORAL | Status: DC

## 2015-09-26 MED ORDER — ATENOLOL 100 MG PO TABS: ORAL_TABLET | ORAL | Status: DC

## 2015-09-26 NOTE — Progress Notes (Signed)
Patient ID: Shaun [name], male   DOB: 1966-07-05, 50 y.o.   MRN: [id number]   This very nice 50 y.o. [name] presents for 6 month follow up with Hypertension, Hyperlipidemia, Pre-Diabetes and Vitamin D Deficiency.    Patient is treated for HTN since 1999 & BP has been controlled at home. Today's BP is 140/94. Patient has had no complaints of any cardiac type chest pain, palpitations, dyspnea/orthopnea/PND, dizziness, claudication, or dependent edema.   Hyperlipidemia is controlled with diet & meds. Patient denies myalgias or other med SE's. Last Lipids were at goal with Cholesterol 178; HDL 19*; LDL NOT CALC; and elevated Triglycerides 427 on 06/20/2015.   Also, the patient has history of Morbid Obesity (BMI 33+) & is not dietary compliant and has  consequent T2_NIDDM since 2004 and denies symptoms of reactive hypoglycemia, diabetic polys, paresthesias or visual blurring. Patient reports CBG's range betw 200-300 mg%.  Last A1c was 9.3% on 06/20/2015.   Further, the patient also has history of Vitamin D Deficiency of "15" in 2009 and supplements vitamin D without any suspected side-effects. Last vitamin D was  22 on 06/20/2015.    Medication Sig  . atenolol100 MG  TAKE 1 TABLET (100 MG TOTAL) BY MOUTH DAILY.  . fluconazole  150 MG Take 1 tablet weekly for yeast infection.  . gabapentin  300 MG  1-3 at night for pain  . glyBURIDE  5 MG t TAKE 1 TABLET (5 MG TOTAL) BY MOUTH 3 (THREE) TIMES DAILY WITH MEALS.  . meloxicam  15 MG  Take one daily with food for 2 weeks, can take with tylenol, can not take with aleve, iburpofen, then as needed daily for pain  . metFORMIN-XR 500 MG TAKE 2 PILLS TWICE DAILY WITH FOOD  . sitaGLIPtin 100 MG Take 1 tablet (100 mg total) by mouth daily.   Allergies  Allergen Reactions  . Zocor [Simvastatin]     Headache  . Zoloft [Sertraline Hcl]     dysphona   PMHx:   Past Medical History  Diagnosis Date  . Hypertension   . Hyperlipidemia   . Diabetes mellitus  without complication (HCC)   . GERD (gastroesophageal reflux disease)   . Asthma   . Gout   . Fatty liver disease, nonalcoholic   . Obesity   . Venous insufficiency   . Hypogonadism male    Immunization History  Administered Date(s) Administered  . PPD Test 02/17/2014, 03/08/2015  . Pneumococcal-Unspecified 02/09/2013  . Tdap 02/09/2013   Past Surgical History  Procedure Laterality Date  . Anterior cruciate ligament repair Left 2001  . Knee arthroscopy Right 2002, 1995  . Cystoscopy  1993  . Esophagogastroduodenoscopy  1995, 2002    gastritis, esophagititis   FHx:    Reviewed / unchanged  SHx:    Reviewed / unchanged  Systems Review:  Constitutional: Denies fever, chills, wt changes, headaches, insomnia, fatigue, night sweats, change in appetite. Eyes: Denies redness, blurred vision, diplopia, discharge, itchy, watery eyes.  ENT: Denies discharge, congestion, post nasal drip, epistaxis, sore throat, earache, hearing loss, dental pain, tinnitus, vertigo, sinus pain, snoring.  CV: Denies chest pain, palpitations, irregular heartbeat, syncope, dyspnea, diaphoresis, orthopnea, PND, claudication or edema. Respiratory: denies cough, dyspnea, DOE, pleurisy, hoarseness, laryngitis, wheezing.  Gastrointestinal: Denies dysphagia, odynophagia, heartburn, reflux, water brash, abdominal pain or cramps, nausea, vomiting, bloating, diarrhea, constipation, hematemesis, melena, hematochezia  or hemorrhoids. Genitourinary: Denies dysuria, frequency, urgency, nocturia, hesitancy, discharge, hematuria or flank pain. Musculoskeletal: Denies arthralgias, myalgias, stiffness, jt.  swelling, pain, limping or strain/sprain.  Skin: Denies pruritus, rash, hives, warts, acne, eczema or change in skin lesion(s). Neuro: No weakness, tremor, incoordination, spasms, paresthesia or pain. Psychiatric: Denies confusion, memory loss or sensory loss. Endo: Denies change in weight, skin or hair change.  Heme/Lymph:  No excessive bleeding, bruising or enlarged lymph nodes.  Physical Exam  BP 140/94 mmHg  Pulse 76  Temp(Src) 97 F (36.1 C)  Resp 16  Ht 5' 10.5" (1.791 m)  Wt 237 lb 12.8 oz (107.865 kg)  BMI 33.63 kg/m2  Appears well nourished and in no distress. Eyes: PERRLA, EOMs, conjunctiva no swelling or erythema. Sinuses: No frontal/maxillary tenderness ENT/Mouth: EAC's clear, TM's nl w/o erythema, bulging. Nares clear w/o erythema, swelling, exudates. Oropharynx clear without erythema or exudates. Oral hygiene is good. Tongue normal, non obstructing. Hearing intact.  Neck: Supple. Thyroid nl. Car 2+/2+ without bruits, nodes or JVD. Chest: Respirations nl with BS clear & equal w/o rales, rhonchi, wheezing or stridor.  Cor: Heart sounds normal w/ regular rate and rhythm without sig. murmurs, gallops, clicks, or rubs. Peripheral pulses normal and equal  without edema.  Abdomen: Soft & bowel sounds normal. Non-tender w/o guarding, rebound, hernias, masses, or organomegaly.  Lymphatics: Unremarkable.  Musculoskeletal: Full ROM all peripheral extremities, joint stability, 5/5 strength, and normal gait.  Skin: Warm, dry without exposed rashes, lesions or ecchymosis apparent.  Neuro: Cranial nerves intact, reflexes equal bilaterally. Sensory-motor testing grossly intact. Tendon reflexes grossly intact.  Pysch: Alert & oriented x 3.  Insight and judgement nl & appropriate. No ideations.  Assessment and Plan:  1. Essential hypertension  - TSH  2. Hyperlipidemia  - Lipid panel - TSH  3. T2_NIDDM  - Hemoglobin A1c - Insulin, random  4. Vitamin D deficiency  - VITAMIN D 25 Hydroxy   5. Testosterone deficiency  - Testosterone  6. Idiopathic gout  - Uric acid  7. Morbid obesity, unspecified obesity type (HCC)   8. Medication management  - CBC with Differential/Platelet - BASIC METABOLIC PANEL WITH GFR - Hepatic function panel - Magnesium   Recommended regular exercise, BP  monitoring, weight control, and discussed med and SE's. Recommended labs to assess and monitor clinical status. Further disposition pending results of labs. Over 30 minutes of exam, counseling, chart review was performed

## 2015-09-26 NOTE — Patient Instructions (Signed)

## 2015-09-27 LAB — BASIC METABOLIC PANEL WITH GFR
BUN: 14 mg/dL (ref 7–25)
CO2: 28 mmol/L (ref 20–31)
Calcium: 9.9 mg/dL (ref 8.6–10.3)
Chloride: 100 mmol/L (ref 98–110)
Creat: 0.69 mg/dL — ABNORMAL LOW (ref 0.70–1.33)
GFR, Est African American: >89 mL/min (ref >=60)
GFR, Est Non African American: >89 mL/min (ref >=60)
Glucose, Bld: 167 mg/dL — ABNORMAL HIGH (ref 65–99)
Potassium: 4.9 mmol/L (ref 3.5–5.3)
Sodium: 139 mmol/L (ref 135–146)

## 2015-09-27 LAB — CBC WITH DIFFERENTIAL/PLATELET
Basophils Absolute: 0 10*3/uL (ref 0.0–0.1)
Basophils Relative: 0 % (ref 0–1)
Eosinophils Absolute: 0.2 10*3/uL (ref 0.0–0.7)
Eosinophils Relative: 3 % (ref 0–5)
HCT: 47.7 % (ref 39.0–52.0)
Hemoglobin: 16.5 g/dL (ref 13.0–17.0)
Lymphocytes Relative: 48 % — ABNORMAL HIGH (ref 12–46)
Lymphs Abs: 3.2 10*3/uL (ref 0.7–4.0)
MCH: 28.7 pg (ref 26.0–34.0)
MCHC: 34.6 g/dL (ref 30.0–36.0)
MCV: 83.1 fL (ref 78.0–100.0)
MPV: 10.2 fL (ref 8.6–12.4)
Monocytes Absolute: 0.5 10*3/uL (ref 0.1–1.0)
Monocytes Relative: 7 % (ref 3–12)
Neutro Abs: 2.8 10*3/uL (ref 1.7–7.7)
Neutrophils Relative %: 42 % — ABNORMAL LOW (ref 43–77)
Platelets: 206 10*3/uL (ref 150–400)
RBC: 5.74 MIL/uL (ref 4.22–5.81)
RDW: 14.3 % (ref 11.5–15.5)
WBC: 6.7 10*3/uL (ref 4.0–10.5)

## 2015-09-27 LAB — MAGNESIUM: Magnesium: 1.9 mg/dL (ref 1.5–2.5)

## 2015-09-27 LAB — HEPATIC FUNCTION PANEL
ALT: 33 U/L (ref 9–46)
AST: 23 U/L (ref 10–35)
Albumin: 4.6 g/dL (ref 3.6–5.1)
Alkaline Phosphatase: 52 U/L (ref 40–115)
Bilirubin, Direct: 0.1 mg/dL (ref <=0.2)
Indirect Bilirubin: 0.5 mg/dL (ref 0.2–1.2)
Total Bilirubin: 0.6 mg/dL (ref 0.2–1.2)
Total Protein: 7.3 g/dL (ref 6.1–8.1)

## 2015-09-27 LAB — HEMOGLOBIN A1C
Hgb A1c MFr Bld: 9.5 % — ABNORMAL HIGH (ref <5.7)
Mean Plasma Glucose: 226 mg/dL — ABNORMAL HIGH (ref <117)

## 2015-09-27 LAB — LIPID PANEL
Cholesterol: 176 mg/dL (ref 125–200)
HDL: 21 mg/dL — ABNORMAL LOW (ref >=40)
Total CHOL/HDL Ratio: 8.4 Ratio — ABNORMAL HIGH (ref <=5.0)
Triglycerides: 401 mg/dL — ABNORMAL HIGH (ref <150)

## 2015-09-27 LAB — TSH: TSH: 1.72 mIU/L (ref 0.40–4.50)

## 2015-09-27 LAB — TESTOSTERONE: Testosterone: 186 ng/dL — ABNORMAL LOW (ref 250–827)

## 2015-09-27 LAB — URIC ACID: Uric Acid, Serum: 7.3 mg/dL (ref 4.0–7.8)

## 2015-09-27 LAB — VITAMIN D 25 HYDROXY (VIT D DEFICIENCY, FRACTURES): Vit D, 25-Hydroxy: 24 ng/mL — ABNORMAL LOW (ref 30–100)

## 2015-09-27 LAB — INSULIN, RANDOM: Insulin: 29.7 u[IU]/mL — ABNORMAL HIGH (ref 2.0–19.6)

## 2015-10-31 ENCOUNTER — Encounter: Payer: Self-pay | Admitting: Internal Medicine

## 2015-10-31 ENCOUNTER — Ambulatory Visit (INDEPENDENT_AMBULATORY_CARE_PROVIDER_SITE_OTHER): Payer: BLUE CROSS/BLUE SHIELD | Admitting: Internal Medicine

## 2015-10-31 VITALS — BP 134/84 | HR 84 | Temp 97.3°F | Resp 16 | Ht 71.0 in | Wt 230.2 lb

## 2015-10-31 DIAGNOSIS — J0410 Acute tracheitis without obstruction: Secondary | ICD-10-CM | POA: Diagnosis not present

## 2015-10-31 MED ORDER — BENZONATATE 200 MG PO CAPS: ORAL_CAPSULE | ORAL | Status: AC

## 2015-10-31 MED ORDER — HYDROCODONE-ACETAMINOPHEN 5-325 MG PO TABS: ORAL_TABLET | ORAL | Status: AC

## 2015-10-31 MED ORDER — PREDNISONE 10 MG PO TABS: ORAL_TABLET | ORAL | Status: DC

## 2015-10-31 MED ORDER — AZITHROMYCIN 250 MG PO TABS: ORAL_TABLET | ORAL | Status: DC

## 2015-10-31 NOTE — Progress Notes (Signed)
  Subjective:    Patient ID: Shaun [name], male    DOB: [birth year], 50 y.o.  Shaun [name] MRN: [id number]  HPI  Patient with hx/o asthma presents with 2 week hx/o dry cough occasionally productive of a greenish to brown sputum. OTC meds have not controlled his cough. Denies fever, chills, sweats,dyspnea or rash.  Medication Sig  . aspirin 81 MG tablet Take 81 mg by mouth daily.  Marland Kitchen. atenolol (TENORMIN) 100 MG tablet TAKE 1 TABLET (100 MG TOTAL) BY MOUTH DAILY.  Marland Kitchen. Cholecalciferol (VITAMIN D) 2000 units CAPS Take 2 capsules by mouth daily.  . fluconazole (DIFLUCAN) 150 MG tablet Take 1 tablet weekly for yeast infection.  . gabapentin (NEURONTIN) 300 MG capsule 1-3 at night for pain  . glyBURIDE (DIABETA) 5 MG tablet TAKE 1 TABLET (5 MG TOTAL) BY MOUTH 3 (THREE) TIMES DAILY WITH MEALS.  . meloxicam (MOBIC) 15 MG tablet Take one daily with food for 2 weeks, can take with tylenol, can not take with aleve, iburpofen, then as needed daily for pain  . metFORMIN (GLUCOPHAGE-XR) 500 MG 24 hr tablet TAKE 2 PILLS TWICE DAILY WITH FOOD  . sitaGLIPtin (JANUVIA) 100 MG tablet Take 1 tablet (100 mg total) by mouth daily.   Allergies  Allergen Reactions  . Zocor [Simvastatin]     Headache  . Zoloft [Sertraline Hcl]     dysphona   Past Medical History  Diagnosis Date  . Hypertension   . Hyperlipidemia   . Diabetes mellitus without complication (HCC)   . GERD (gastroesophageal reflux disease)   . Asthma   . Gout   . Fatty liver disease, nonalcoholic   . Obesity   . Venous insufficiency   . Hypogonadism male    Review of Systems  10 point systems review negative except as above.    Objective:   Physical Exam  BP 134/84 mmHg  Pulse 84  Temp(Src) 97.3 F (36.3 C)  Resp 16  Ht 5\' 11"  (1.803 m)  Wt 230 lb 3.2 oz (104.418 kg)  BMI 32.12 kg/m2  Dry cough. No respiratory stridor.  HEENT - Eac's patent. TM's Nl. EOM's full. PERRLA. NasoOroPharynx clear. Neck - supple. Nl Thyroid. Carotids 2+ & No  bruits, nodes, JVD Chest -BS equal with scattered dry  Rales and no  Rhonchi or wheezes. Cor - Nl HS. RRR w/o sig MGR. No edema MS- FROM w/o deformities. Neuro -Nl w/o focal abnormalities.    Assessment & Plan:   1. Tracheitis  - predniSONE (DELTASONE) 10 MG tablet; 1 tab 3 x day for 3 days, then 1 tab 2 x day for 3 days, then 1 tab 1 x day for 5 days  Dispense: 20 tablet; Refill: 0 - azithromycin (ZITHROMAX) 250 MG tablet; Take 2 tablets (500 mg) on  Day 1,  followed by 1 tablet (250 mg) once daily on Days 2 through 5.  Dispense: 6 each; Refill: 1 - benzonatate (TESSALON) 200 MG capsule; Take 1 perle 3 x day to prebventycough  Dispense: 30 capsule; Refill: 1 - HYDROcodone-acetaminophen (NORCO) 5-325 MG tablet; Take 1/2 to 1 tablet 3 x day to prevent cough  Dispense: 50 tablet; Refill: 0  - Discussed meds/SE's and ROV - prn

## 2015-12-13 DIAGNOSIS — M25462 Effusion, left knee: Secondary | ICD-10-CM | POA: Diagnosis not present

## 2015-12-13 DIAGNOSIS — M25562 Pain in left knee: Secondary | ICD-10-CM | POA: Diagnosis not present

## 2015-12-15 ENCOUNTER — Other Ambulatory Visit: Payer: Self-pay | Admitting: Internal Medicine

## 2015-12-26 ENCOUNTER — Ambulatory Visit (INDEPENDENT_AMBULATORY_CARE_PROVIDER_SITE_OTHER): Payer: BLUE CROSS/BLUE SHIELD | Admitting: Physician Assistant

## 2015-12-26 ENCOUNTER — Encounter: Payer: Self-pay | Admitting: Physician Assistant

## 2015-12-26 VITALS — BP 126/80 | HR 73 | Temp 97.2°F | Resp 16 | Ht 71.0 in | Wt 230.4 lb

## 2015-12-26 DIAGNOSIS — E349 Endocrine disorder, unspecified: Secondary | ICD-10-CM

## 2015-12-26 DIAGNOSIS — E785 Hyperlipidemia, unspecified: Secondary | ICD-10-CM | POA: Diagnosis not present

## 2015-12-26 DIAGNOSIS — I10 Essential (primary) hypertension: Secondary | ICD-10-CM

## 2015-12-26 DIAGNOSIS — E559 Vitamin D deficiency, unspecified: Secondary | ICD-10-CM | POA: Diagnosis not present

## 2015-12-26 DIAGNOSIS — E119 Type 2 diabetes mellitus without complications: Secondary | ICD-10-CM | POA: Diagnosis not present

## 2015-12-26 DIAGNOSIS — E291 Testicular hypofunction: Secondary | ICD-10-CM

## 2015-12-26 DIAGNOSIS — Z79899 Other long term (current) drug therapy: Secondary | ICD-10-CM | POA: Diagnosis not present

## 2015-12-26 MED ORDER — TESTOSTERONE 20.25 MG/ACT (1.62%) TD GEL: TRANSDERMAL | Status: DC

## 2015-12-26 MED ORDER — INSULIN GLARGINE 300 UNIT/ML ~~LOC~~ SOPN: 25.0000 [IU] | PEN_INJECTOR | Freq: Every morning | SUBCUTANEOUS | Status: DC

## 2015-12-26 NOTE — Patient Instructions (Addendum)
10 units at night, increase by 3 units if morning sugar is above 150  May need to continue the gylbuide for now for the meal time sugars   Your A1C is a measure of your sugar over the past 3 months and is not affected by what you have eaten over the past few days. Diabetes increases your chances of stroke and heart attack over 300 % and is the leading cause of blindness and kidney failure in the [place]. Please make sure you decrease bad carbs like white bread, white rice, potatoes, corn, soft drinks, pasta, cereals, refined sugars, sweet tea, dried fruits, and fruit juice. Good carbs are okay to eat in moderation like sweet potatoes, brown rice, whole grain pasta/bread, most fruit (except dried fruit) and you can eat as many veggies as you want.   Greater than 6.5 is considered diabetic. Between 6.4 and 5.7 is prediabetic If your A1C is less than 5.7 you are NOT diabetic.  Targets for Glucose Readings: Time of Check Target for patients WITHOUT Diabetes Target for DIABETICS  Before Meals Less than 100  less than 150  Two hours after meals Less than 200  Less than 250   If your morning sugar is always below 100 then the issue is with your sugar spiking after meals. Try to take your blood sugar approximately 2 hours after eating, this number should be less than 200. If it is not, think about the foods that you ate and better choices you can make.    9 Ways to Naturally Increase Testosterone Levels  1.   Lose Weight If you're overweight, shedding the excess pounds may increase your testosterone levels, according to research presented at the Endocrine Society's 2012 meeting. Overweight men are more likely to have low testosterone levels to begin with, so this is an important trick to increase your body's testosterone production when you need it most.  2.   High-Intensity Exercise like Peak [employer]itness  Short intense exercise has a proven positive effect on increasing testosterone levels and  preventing its decline. That's unlike aerobics or prolonged moderate exercise, which have shown to have negative or no effect on testosterone levels. Having a whey protein meal after exercise can further enhance the satiety/testosterone-boosting impact (hunger hormones cause the opposite effect on your testosterone and libido). Here's a summary of what a typical high-intensity [employer] Fitness routine might look like: " Warm up for three minutes  " Exercise as hard and fast as you can for 30 seconds. You should feel like you couldn't possibly go on another few seconds  " Recover at a slow to moderate pace for 90 seconds  " Repeat the high intensity exercise and recovery 7 more times .  3.   Consume Plenty of Zinc The mineral zinc is important for testosterone production, and supplementing your diet for as little as six weeks has been shown to cause a marked improvement in testosterone among men with low levels.1 Likewise, research has shown that restricting dietary sources of zinc leads to a significant decrease in testosterone, while zinc supplementation increases it2 -- and even protects men from exercised-induced reductions in testosterone levels.3 It's estimated that up to 45 percent of adults over the age of 60 may have lower than recommended zinc intakes; even when dietary supplements were added in, an estimated 20-25 percent of older adults still had inadequate zinc intakes, according to a [employer] and Nutrition Examination Survey.4 Your diet is the best source of zinc; along with  protein-rich foods like meats and fish, other good dietary sources of zinc include raw milk, raw cheese, beans, and yogurt or kefir made from raw milk. It can be difficult to obtain enough dietary zinc if you're a vegetarian, and also for meat-eaters as well, largely because of conventional farming methods that rely heavily on chemical fertilizers and pesticides. These chemicals deplete the soil of nutrients ...  nutrients like zinc that must be absorbed by plants in order to be passed on to you. In many cases, you may further deplete the nutrients in your food by the way you prepare it. For most food, cooking it will drastically reduce its levels of nutrients like zinc . particularly over-cooking, which many people do. If you decide to use a zinc supplement, stick to a dosage of less than 40 mg a day, as this is the recommended adult upper limit. Taking too much zinc can interfere with your body's ability to absorb other minerals, especially copper, and may cause nausea as a side effect.  4.   Strength Training In addition to [employer] Fitness, strength training is also known to boost testosterone levels, provided you are doing so intensely enough. When strength training to boost testosterone, you'll want to increase the weight and lower your number of reps, and then focus on exercises that work a large number of muscles, such as dead lifts or squats.  You can "turbo-charge" your weight training by going slower. By slowing down your movement, you're actually turning it into a high-intensity exercise. Super Slow movement allows your muscle, at the microscopic level, to access the maximum number of cross-bridges between the protein filaments that produce movement in the muscle.   5.   Optimize Your Vitamin D Levels Vitamin D, a steroid hormone, is essential for the healthy development of the nucleus of the sperm cell, and helps maintain semen quality and sperm count. Vitamin D also increases levels of testosterone, which may boost libido. In one study, overweight men who were given vitamin D supplements had a significant increase in testosterone levels after one year.5   6.   Reduce Stress When you're under a lot of stress, your body releases high levels of the stress hormone cortisol. This hormone actually blocks the effects of testosterone,6 presumably because, from a biological standpoint, testosterone-associated  behaviors (mating, competing, aggression) may have lowered your chances of survival in an emergency (hence, the "fight or flight" response is dominant, courtesy of cortisol).  7.   Limit or Eliminate Sugar from Your Diet Testosterone levels decrease after you eat sugar, which is likely because the sugar leads to a high insulin level, another factor leading to low testosterone.7 Based on USDA estimates, the average American consumes 12 teaspoons of sugar a day, which equates to about TWO TONS of sugar during a lifetime.  8.   Eat Healthy Fats By healthy, this means not only mon- and polyunsaturated fats, like that found in avocadoes and nuts, but also saturated, as these are essential for building testosterone. Research shows that a diet with less than 40 percent of energy as fat (and that mainly from animal sources, i.e. saturated) lead to a decrease in testosterone levels.8 My personal diet is about 60-70 percent healthy fat, and other experts agree that the ideal diet includes somewhere between 50-70 percent fat.  It's important to understand that your body requires saturated fats from animal and vegetable sources (such as meat, dairy, certain oils, and tropical plants like coconut) for optimal functioning, and if  you neglect this important food group in favor of sugar, grains and other starchy carbs, your health and weight are almost guaranteed to suffer. Examples of healthy fats you can eat more of to give your testosterone levels a boost include: Olives and Olive oil  Coconuts and coconut oil Butter made from raw grass-fed organic milk Raw nuts, such as, almonds or pecans Organic pastured egg yolks Avocados Grass-fed meats Palm oil Unheated organic nut oils   9.   Boost Your Intake of Branch Chain Amino Acids (BCAA) from Foods Like Whey Protein Research suggests that BCAAs result in higher testosterone levels, particularly when taken along with resistance training.9 While BCAAs are available in  supplement form, you'll find the highest concentrations of BCAAs like leucine in dairy products - especially quality cheeses and whey protein. Even when getting leucine from your natural food supply, it's often wasted or used as a building block instead of an anabolic agent. So to create the correct anabolic environment, you need to boost leucine consumption way beyond mere maintenance levels. That said, keep in mind that using leucine as a free form amino acid can be highly counterproductive as when free form amino acids are artificially administrated, they rapidly enter your circulation while disrupting insulin function, and impairing your body's glycemic control. Food-based leucine is really the ideal form that can benefit your muscles without side effects.

## 2015-12-26 NOTE — Progress Notes (Signed)
Assessment and Plan:  1. Hypertension -Continue medication, monitor blood pressure at home. Continue DASH diet.  Reminder to go to the ER if any CP, SOB, nausea, dizziness, severe HA, changes vision/speech, left arm numbness and tingling and jaw pain.  2. Cholesterol -Continue diet and exercise. Check cholesterol.   3. Diabetes without complications -Continue diet and exercise. Check A1C Has been on meds x 3 months ? parcreatic shock verus insulin dependent, declines labs to look for autoimmune- will try long acting medication at this time, 10 units at night, increase by 3 units if morning sugar is above 150  4. Vitamin D Def - check level and continue medications.   5. Hypogonadism -start androgel, 2 pumps each leg, check testosterone levels 6 weeks   Continue diet and meds as discussed. Further disposition pending results of labs. Discussed med's effects and SE's.    Over 30 minutes of exam, counseling, chart review, and critical decision making was performed   HPI 50 y.o. male  presents for 3 month follow up on hypertension, cholesterol, diabetes and vitamin D deficiency.   His blood pressure has been controlled at home, today his BP is  .  He does not workout. He denies chest pain, shortness of breath, dizziness.  He is not on cholesterol medication and denies myalgias. His cholesterol is at goal. The cholesterol was:   Lab Results  Component Value Date   CHOL 176 09/26/2015   HDL 21* 09/26/2015   LDLCALC NOT CALC 09/26/2015   TRIG 401* 09/26/2015   CHOLHDL 8.4* 09/26/2015    He has been working on diet and exercise for diabetes without complications, he is on the glyburide, metformin, and januvia, sugars still in the 200s, he is on bASA, he is not on ACE/ARB, and denies  paresthesia of the feet, polydipsia, polyuria and visual disturbances. Last A1C was:  Lab Results  Component Value Date   HGBA1C 9.5* 09/26/2015  Last GFR:  Lab Results  Component Value Date   GFRNONAA  >89 09/26/2015   Patient is on Vitamin D supplement. Lab Results  Component Value Date   VD25OH 24* 09/26/2015   He has a history of testosterone deficiency and is not on treatment.  Lab Results  Component Value Date   TESTOSTERONE 186* 09/26/2015     Current Medications:  Current Outpatient Prescriptions on File Prior to Visit  Medication Sig Dispense Refill  . aspirin 81 MG tablet Take 81 mg by mouth daily.    Marland Kitchen atenolol (TENORMIN) 100 MG tablet TAKE 1 TABLET (100 MG TOTAL) BY MOUTH DAILY. 90 tablet 1  . azithromycin (ZITHROMAX) 250 MG tablet Take 2 tablets (500 mg) on  Day 1,  followed by 1 tablet (250 mg) once daily on Days 2 through 5. 6 each 1  . Cholecalciferol (VITAMIN D) 2000 units CAPS Take 2 capsules by mouth daily.    . fluconazole (DIFLUCAN) 150 MG tablet Take 1 tablet weekly for yeast infection. 5 tablet 5  . gabapentin (NEURONTIN) 300 MG capsule 1-3 at night for pain 90 capsule 2  . glyBURIDE (DIABETA) 5 MG tablet TAKE 1 TABLET (5 MG TOTAL) BY MOUTH 3 (THREE) TIMES DAILY WITH MEALS. 270 tablet 0  . meloxicam (MOBIC) 15 MG tablet Take one daily with food for 2 weeks, can take with tylenol, can not take with aleve, iburpofen, then as needed daily for pain 30 tablet 1  . metFORMIN (GLUCOPHAGE-XR) 500 MG 24 hr tablet TAKE 2 PILLS TWICE DAILY WITH FOOD  360 tablet 0  . predniSONE (DELTASONE) 10 MG tablet 1 tab 3 x day for 3 days, then 1 tab 2 x day for 3 days, then 1 tab 1 x day for 5 days 20 tablet 0  . sitaGLIPtin (JANUVIA) 100 MG tablet Take 1 tablet (100 mg total) by mouth daily. 30 tablet 5   No current facility-administered medications on file prior to visit.   Medical History:  Past Medical History  Diagnosis Date  . Hypertension   . Hyperlipidemia   . Diabetes mellitus without complication (HCC)   . GERD (gastroesophageal reflux disease)   . Asthma   . Gout   . Fatty liver disease, nonalcoholic   . Obesity   . Venous insufficiency   . Hypogonadism male     Allergies:  Allergies  Allergen Reactions  . Zocor [Simvastatin]     Headache  . Zoloft [Sertraline Hcl]     dysphona     Review of Systems:  Review of Systems  Constitutional: Negative for fever, chills and malaise/fatigue.  HENT: Negative for congestion, ear pain and sore throat.   Eyes: Negative.   Respiratory: Negative for cough, shortness of breath and wheezing.   Cardiovascular: Negative for chest pain, palpitations and leg swelling.  Gastrointestinal: Negative for heartburn, nausea, vomiting, diarrhea, constipation, blood in stool and melena.  Genitourinary: Negative for dysuria, urgency and frequency.  Musculoskeletal: Positive for joint pain (right elbow). Negative for myalgias, back pain, falls and neck pain.  Skin: Negative.   Neurological: Negative for dizziness, sensory change and headaches.  Endo/Heme/Allergies: Positive for polydipsia.  Psychiatric/Behavioral: Negative for depression. The patient is not nervous/anxious.     Family history- Review and unchanged Social history- Review and unchanged Physical Exam: There were no vitals taken for this visit. Wt Readings from Last 3 Encounters:  10/31/15 230 lb 3.2 oz (104.418 kg)  09/26/15 237 lb 12.8 oz (107.865 kg)  06/20/15 234 lb (106.142 kg)   General Appearance: Well nourished, in no apparent distress. Eyes: PERRLA, EOMs, conjunctiva no swelling or erythema Sinuses: No Frontal/maxillary tenderness ENT/Mouth: Ext aud canals clear, TMs without erythema, bulging. No erythema, swelling, or exudate on post pharynx.  Tonsils not swollen or erythematous. Hearing normal.  Neck: Supple, thyroid normal.  Respiratory: Respiratory effort normal, BS equal bilaterally without rales, rhonchi, wheezing or stridor.  Cardio: RRR with no MRGs. Brisk peripheral pulses without edema.  Abdomen: Soft, + BS.  Non tender, no guarding, rebound, hernias, masses. Lymphatics: Non tender without lymphadenopathy.  Musculoskeletal:  Full ROM, 5/5 strength, Normal gait. No warmth, swelling, redness. Pain with flexion and extension of right elbow, pin point tenderness at medial right epicondyle.  Skin: Warm, dry without rashes, lesions, ecchymosis.  Neuro: Cranial nerves intact. No cerebellar symptoms.  Psych: Awake and oriented X 3, normal affect, Insight and Judgment appropriate.    Quentin MullingAmanda Tavon Corriher, PA-C 4:39 PM [facility]

## 2015-12-27 LAB — CBC WITH DIFFERENTIAL/PLATELET
Basophils Absolute: 0 cells/uL (ref 0–200)
Basophils Relative: 0 %
Eosinophils Absolute: 176 cells/uL (ref 15–500)
Eosinophils Relative: 2 %
HCT: 48.1 % (ref 38.5–50.0)
Hemoglobin: 16.5 g/dL (ref 13.2–17.1)
Lymphocytes Relative: 40 %
Lymphs Abs: 3520 cells/uL (ref 850–3900)
MCH: 28.8 pg (ref 27.0–33.0)
MCHC: 34.3 g/dL (ref 32.0–36.0)
MCV: 84.1 fL (ref 80.0–100.0)
MPV: 9.4 fL (ref 7.5–12.5)
Monocytes Absolute: 528 cells/uL (ref 200–950)
Monocytes Relative: 6 %
Neutro Abs: 4576 cells/uL (ref 1500–7800)
Neutrophils Relative %: 52 %
Platelets: 234 10*3/uL (ref 140–400)
RBC: 5.72 MIL/uL (ref 4.20–5.80)
RDW: 13.7 % (ref 11.0–15.0)
WBC: 8.8 10*3/uL (ref 3.8–10.8)

## 2015-12-27 LAB — LIPID PANEL
Cholesterol: 181 mg/dL (ref 125–200)
HDL: 28 mg/dL — ABNORMAL LOW (ref >=40)
Total CHOL/HDL Ratio: 6.5 Ratio — ABNORMAL HIGH (ref <=5.0)
Triglycerides: 413 mg/dL — ABNORMAL HIGH (ref <150)

## 2015-12-27 LAB — HEPATIC FUNCTION PANEL
ALT: 32 U/L (ref 9–46)
AST: 25 U/L (ref 10–35)
Albumin: 4.7 g/dL (ref 3.6–5.1)
Alkaline Phosphatase: 49 U/L (ref 40–115)
Bilirubin, Direct: 0.1 mg/dL (ref <=0.2)
Indirect Bilirubin: 0.4 mg/dL (ref 0.2–1.2)
Total Bilirubin: 0.5 mg/dL (ref 0.2–1.2)
Total Protein: 7.2 g/dL (ref 6.1–8.1)

## 2015-12-27 LAB — BASIC METABOLIC PANEL WITH GFR
BUN: 15 mg/dL (ref 7–25)
CO2: 27 mmol/L (ref 20–31)
Calcium: 10.3 mg/dL (ref 8.6–10.3)
Chloride: 102 mmol/L (ref 98–110)
Creat: 0.69 mg/dL — ABNORMAL LOW (ref 0.70–1.33)
GFR, Est African American: >89 mL/min (ref >=60)
GFR, Est Non African American: >89 mL/min (ref >=60)
Glucose, Bld: 106 mg/dL — ABNORMAL HIGH (ref 65–99)
Potassium: 4.5 mmol/L (ref 3.5–5.3)
Sodium: 142 mmol/L (ref 135–146)

## 2015-12-27 LAB — MAGNESIUM: Magnesium: 1.8 mg/dL (ref 1.5–2.5)

## 2015-12-27 LAB — VITAMIN D 25 HYDROXY (VIT D DEFICIENCY, FRACTURES): Vit D, 25-Hydroxy: 41 ng/mL (ref 30–100)

## 2015-12-27 LAB — HEMOGLOBIN A1C
Hgb A1c MFr Bld: 8.7 % — ABNORMAL HIGH (ref <5.7)
Mean Plasma Glucose: 203 mg/dL

## 2015-12-27 LAB — TESTOSTERONE: Testosterone: 178 ng/dL — ABNORMAL LOW (ref 250–827)

## 2015-12-27 LAB — TSH: TSH: 2.18 mIU/L (ref 0.40–4.50)

## 2015-12-30 DIAGNOSIS — J029 Acute pharyngitis, unspecified: Secondary | ICD-10-CM | POA: Diagnosis not present

## 2016-02-14 ENCOUNTER — Ambulatory Visit: Payer: Self-pay | Admitting: Physician Assistant

## 2016-02-19 ENCOUNTER — Ambulatory Visit: Payer: Self-pay | Admitting: Physician Assistant

## 2016-03-14 ENCOUNTER — Other Ambulatory Visit: Payer: Self-pay | Admitting: Internal Medicine

## 2016-03-15 ENCOUNTER — Other Ambulatory Visit: Payer: Self-pay | Admitting: Physician Assistant

## 2016-03-27 ENCOUNTER — Ambulatory Visit (INDEPENDENT_AMBULATORY_CARE_PROVIDER_SITE_OTHER): Payer: BLUE CROSS/BLUE SHIELD | Admitting: Internal Medicine

## 2016-03-27 VITALS — BP 118/86 | HR 76 | Temp 97.5°F | Resp 16 | Ht 71.75 in | Wt 234.0 lb

## 2016-03-27 DIAGNOSIS — I10 Essential (primary) hypertension: Secondary | ICD-10-CM

## 2016-03-27 DIAGNOSIS — Z1211 Encounter for screening for malignant neoplasm of colon: Secondary | ICD-10-CM

## 2016-03-27 DIAGNOSIS — Z111 Encounter for screening for respiratory tuberculosis: Secondary | ICD-10-CM | POA: Diagnosis not present

## 2016-03-27 DIAGNOSIS — G8929 Other chronic pain: Secondary | ICD-10-CM

## 2016-03-27 DIAGNOSIS — E785 Hyperlipidemia, unspecified: Secondary | ICD-10-CM

## 2016-03-27 DIAGNOSIS — E119 Type 2 diabetes mellitus without complications: Secondary | ICD-10-CM

## 2016-03-27 DIAGNOSIS — Z79899 Other long term (current) drug therapy: Secondary | ICD-10-CM | POA: Diagnosis not present

## 2016-03-27 DIAGNOSIS — Z125 Encounter for screening for malignant neoplasm of prostate: Secondary | ICD-10-CM | POA: Diagnosis not present

## 2016-03-27 DIAGNOSIS — K2100 Gastro-esophageal reflux disease with esophagitis, without bleeding: Secondary | ICD-10-CM

## 2016-03-27 DIAGNOSIS — Z0001 Encounter for general adult medical examination with abnormal findings: Secondary | ICD-10-CM

## 2016-03-27 DIAGNOSIS — Z0000 Encounter for general adult medical examination without abnormal findings: Secondary | ICD-10-CM | POA: Diagnosis not present

## 2016-03-27 DIAGNOSIS — E559 Vitamin D deficiency, unspecified: Secondary | ICD-10-CM

## 2016-03-27 DIAGNOSIS — M1000 Idiopathic gout, unspecified site: Secondary | ICD-10-CM

## 2016-03-27 DIAGNOSIS — R5383 Other fatigue: Secondary | ICD-10-CM

## 2016-03-27 DIAGNOSIS — Z136 Encounter for screening for cardiovascular disorders: Secondary | ICD-10-CM

## 2016-03-27 DIAGNOSIS — M25511 Pain in right shoulder: Secondary | ICD-10-CM

## 2016-03-27 LAB — CBC WITH DIFFERENTIAL/PLATELET
Basophils Absolute: 0 cells/uL (ref 0–200)
Basophils Relative: 0 %
Eosinophils Absolute: 186 cells/uL (ref 15–500)
Eosinophils Relative: 3 %
HCT: 46.7 % (ref 38.5–50.0)
Hemoglobin: 16.2 g/dL (ref 13.2–17.1)
Lymphocytes Relative: 50 %
Lymphs Abs: 3100 cells/uL (ref 850–3900)
MCH: 28.6 pg (ref 27.0–33.0)
MCHC: 34.7 g/dL (ref 32.0–36.0)
MCV: 82.4 fL (ref 80.0–100.0)
MPV: 9.6 fL (ref 7.5–12.5)
Monocytes Absolute: 434 cells/uL (ref 200–950)
Monocytes Relative: 7 %
Neutro Abs: 2480 cells/uL (ref 1500–7800)
Neutrophils Relative %: 40 %
Platelets: 191 10*3/uL (ref 140–400)
RBC: 5.67 MIL/uL (ref 4.20–5.80)
RDW: 13.8 % (ref 11.0–15.0)
WBC: 6.2 10*3/uL (ref 3.8–10.8)

## 2016-03-27 LAB — HEPATIC FUNCTION PANEL
ALT: 35 U/L (ref 9–46)
AST: 22 U/L (ref 10–35)
Albumin: 4.3 g/dL (ref 3.6–5.1)
Alkaline Phosphatase: 46 U/L (ref 40–115)
Bilirubin, Direct: 0.1 mg/dL (ref <=0.2)
Indirect Bilirubin: 0.5 mg/dL (ref 0.2–1.2)
Total Bilirubin: 0.6 mg/dL (ref 0.2–1.2)
Total Protein: 7 g/dL (ref 6.1–8.1)

## 2016-03-27 LAB — LIPID PANEL
Cholesterol: 148 mg/dL (ref 125–200)
HDL: 25 mg/dL — ABNORMAL LOW (ref >=40)
LDL Cholesterol: 74 mg/dL (ref <130)
Total CHOL/HDL Ratio: 5.9 Ratio — ABNORMAL HIGH (ref <=5.0)
Triglycerides: 245 mg/dL — ABNORMAL HIGH (ref <150)
VLDL: 49 mg/dL — ABNORMAL HIGH (ref <30)

## 2016-03-27 LAB — BASIC METABOLIC PANEL WITH GFR
BUN: 13 mg/dL (ref 7–25)
CO2: 27 mmol/L (ref 20–31)
Calcium: 9.9 mg/dL (ref 8.6–10.3)
Chloride: 99 mmol/L (ref 98–110)
Creat: 0.72 mg/dL (ref 0.70–1.33)
GFR, Est African American: >89 mL/min (ref >=60)
GFR, Est Non African American: >89 mL/min (ref >=60)
Glucose, Bld: 133 mg/dL — ABNORMAL HIGH (ref 65–99)
Potassium: 4.8 mmol/L (ref 3.5–5.3)
Sodium: 140 mmol/L (ref 135–146)

## 2016-03-27 LAB — PSA: PSA: 0.2 ng/mL (ref <=4.0)

## 2016-03-27 LAB — TSH: TSH: 2.81 mIU/L (ref 0.40–4.50)

## 2016-03-27 LAB — IRON AND TIBC
%SAT: 24 % (ref 15–60)
Iron: 91 ug/dL (ref 50–180)
TIBC: 377 ug/dL (ref 250–425)
UIBC: 286 ug/dL (ref 125–400)

## 2016-03-27 LAB — MAGNESIUM: Magnesium: 1.7 mg/dL (ref 1.5–2.5)

## 2016-03-27 LAB — VITAMIN B12: Vitamin B-12: 406 pg/mL (ref 200–1100)

## 2016-03-27 MED ORDER — PHENTERMINE HCL 37.5 MG PO TABS: ORAL_TABLET | ORAL | 5 refills | Status: DC

## 2016-03-27 NOTE — Progress Notes (Signed)
[facility]   [name], M.D.    Dyanne Carrel. Steffanie Dunn, P.A.-C      Terri Piedra, P.A.-C   [facility]                [address]                [place], [place]. [address] Telephone [phone] Telefax [phone]  Annual  Screening/Preventative Visit And Comprehensive Evaluation & Examination     This very nice 50 y.o.male presents for a Wellness/Preventative Visit & comprehensive evaluation and management of multiple medical co-morbidities.  Patient has been followed for HTN, T2_NIDDM  , Hyperlipidemia and Vitamin D Deficiency.     HTN predates since 1999. Patient's BP has been controlled at home.Today's BP: 118/86. Patient denies any cardiac symptoms as chest pain, palpitations, shortness of breath, dizziness or ankle swelling.     Patient's hyperlipidemia is not fully controlled with diet and medications. Patient denies myalgias or other medication SE's. Last lipids were at goal albeit elevated Trig's.  Lab Results  Component Value Date   CHOL 181 12/26/2015   HDL 28 (L) 12/26/2015   LDLCALC NOT CALC 12/26/2015   TRIG 413 (H) 12/26/2015   CHOLHDL 6.5 (H) 12/26/2015      Patient has Morbid Obesity (BMI 32+) and consequent T2_NIDDM since 2004 and control has been poor due to dietary non compliance. He had recently been prescribed Toujeo which he stopped due to perceived SE of HA. He reports CBG's range in the 200's. He denies reactive hypoglycemic symptoms, visual blurring, diabetic polys or paresthesias. Last A1c was not at goal: Lab Results  Component Value Date   HGBA1C 8.7 (H) 12/26/2015       Finally, patient has history of Vitamin D Deficiency of  "15" in 2009 and last vitamin D was still relatively low: Lab Results  Component Value Date   VD25OH 41 12/26/2015   Current Outpatient Prescriptions on File Prior to Visit  Medication Sig  . aspirin 81 MG  Take  daily.  Marland Kitchen atenolol  100 MG  TAKE 1  TAB DAILY.  Marland Kitchen VITAMIN D 2000 units  Take 4 cap daily.   Marland Kitchen glyBURIDE 5 MG  TAKE 1 TAB 3  TIMES DAILY WITH MEALS.  . metFORMIN-XR 500 MG 24 hr  TAKE 2 TAB TWICE DAILY WITH FOOD  . sitaGLIPtin (JANUVIA) 100 MG  Take 1 tab daily.  . ANDROGEL PUMP 1.62% GEL 2 pumps daily  each arm (4 pumps total)    Allergies  Allergen Reactions  . Zocor [Simvastatin]     Headache  . Zoloft [Sertraline Hcl]     dysphona   Past Medical History:  Diagnosis Date  . Asthma   . Diabetes mellitus without complication (HCC)   . Fatty liver disease, nonalcoholic   . GERD (gastroesophageal reflux disease)   . Gout   . Hyperlipidemia   . Hypertension   . Hypogonadism male   . Obesity   . Venous insufficiency    Health Maintenance  Topic Date Due  . OPHTHALMOLOGY EXAM  06/27/1975  . COLONOSCOPY  06/27/2015  . INFLUENZA VACCINE  03/04/2016  . FOOT EXAM  03/07/2016  . URINE MICROALBUMIN  03/07/2016  . HEMOGLOBIN A1C  06/27/2016  . PNEUMOCOCCAL POLYSACCHARIDE VACCINE (2) 02/09/2018  . TETANUS/TDAP  02/10/2023  . HIV Screening  Completed   Immunization History  Administered Date(s) Administered  . PPD Test 02/17/2014, 03/08/2015  .  Pneumococcal-Unspecified 02/09/2013  . Tdap 02/09/2013   Past Surgical History:  Procedure Laterality Date  . ANTERIOR CRUCIATE LIGAMENT REPAIR Left 2001  . CYSTOSCOPY  1993  . ESOPHAGOGASTRODUODENOSCOPY  1995, 2002   gastritis, esophagititis  . KNEE ARTHROSCOPY Right 2002, 1995   Family History  Problem Relation Age of Onset  . Diabetes Mother   . Hypertension Mother   . Gout Mother   . COPD Father   . Cancer Sister 14    appendix  . Gout Brother   . Hyperlipidemia Brother   . Hypertension Brother   . Hypertension Brother   . Hyperlipidemia Brother   . Gout Brother   . Diabetes Brother   . COPD Sister    Social History   Social History  . Marital status: Married    Spouse name: N/A  . Number of children: N/A  . Years of education: N/A    Occupational History  . Warehouse supervisor   Social History Main Topics  . Smoking status: Former Smoker    Packs/day: 1.00    Years: 15.00    Types: Cigarettes    Quit date: 08/07/2000  . Smokeless tobacco: Never Used  . Alcohol use Yes     Comment: social  . Drug use: No  . Sexual activity: active    ROS Constitutional: Denies fever, chills, weight loss/gain, headaches, insomnia,  night sweats or change in appetite. Does c/o fatigue. Eyes: Denies redness, blurred vision, diplopia, discharge, itchy or watery eyes.  ENT: Denies discharge, congestion, post nasal drip, epistaxis, sore throat, earache, hearing loss, dental pain, Tinnitus, Vertigo, Sinus pain or snoring.  Cardio: Denies chest pain, palpitations, irregular heartbeat, syncope, dyspnea, diaphoresis, orthopnea, PND, claudication or edema Respiratory: denies cough, dyspnea, DOE, pleurisy, hoarseness, laryngitis or wheezing.  Gastrointestinal: Denies dysphagia, heartburn, reflux, water brash, pain, cramps, nausea, vomiting, bloating, diarrhea, constipation, hematemesis, melena, hematochezia, jaundice or hemorrhoids Genitourinary: Denies dysuria, frequency, urgency, nocturia, hesitancy, discharge, hematuria or flank pain Musculoskeletal: Denies arthralgia, myalgia, stiffness, Jt. Swelling, pain, limp or strain/sprain. Denies Falls. Skin: Denies puritis, rash, hives, warts, acne, eczema or change in skin lesion Neuro: No weakness, tremor, incoordination, spasms, paresthesia or pain Psychiatric: Denies confusion, memory loss or sensory loss. Denies Depression. Endocrine: Denies change in weight, skin, hair change, nocturia, and paresthesia, diabetic polys, visual blurring or hyper / hypo glycemic episodes.  Heme/Lymph: No excessive bleeding, bruising or enlarged lymph nodes.  Physical Exam  BP 118/86   Pulse 76   Temp 97.5 F (36.4 C)   Resp 16   Ht 5' 11.75" (1.822 m)   Wt 234 lb (106.1 kg)   BMI 31.96 kg/m    General Appearance: Well nourished, in no apparent distress.  Eyes: PERRLA, EOMs, conjunctiva no swelling or erythema, normal fundi and vessels. Sinuses: No frontal/maxillary tenderness ENT/Mouth: EACs patent / TMs  nl. Nares clear without erythema, swelling, mucoid exudates. Oral hygiene is good. No erythema, swelling, or exudate. Tongue normal, non-obstructing. Tonsils not swollen or erythematous. Hearing normal.  Neck: Supple, thyroid normal. No bruits, nodes or JVD. Respiratory: Respiratory effort normal.  BS equal and clear bilateral without rales, rhonci, wheezing or stridor. Cardio: Heart sounds are normal with regular rate and rhythm and no murmurs, rubs or gallops. Peripheral pulses are normal and equal bilaterally without edema. No aortic or femoral bruits. Chest: symmetric with normal excursions and percussion.  Abdomen: Soft, with Nl bowel sounds. Nontender, no guarding, rebound, hernias, masses, or organomegaly.  Lymphatics: Non tender without lymphadenopathy.  Genitourinary: No hernias.Testes nl. DRE - prostate nl for age - smooth & firm w/o nodules. Musculoskeletal: Full ROM all peripheral extremities, joint stability, 5/5 strength, and normal gait. Decreased ROM rt shoulder secondary to pain .    After informed consent and aseptic alcohol prep at the tender trigger point at the anterior joint line of the R shoulder was injected with 1 ml marcaine 0.5% & 1 ml(10 mg) Dexamethasone.  Skin: Warm and dry without rashes, lesions, cyanosis, clubbing or  ecchymosis.  Neuro: Cranial nerves intact, reflexes equal bilaterally. Normal muscle tone, no cerebellar symptoms. Sensation intact to touch, Vibratory & Monofilament to the toes bilaterally.Marland Kitchen.  Pysch: Alert and oriented X 3 with normal affect, insight and judgment appropriate.   Assessment and Plan  1. Annual Preventative/Screening Exam   - Microalbumin / creatinine urine ratio - EKG 12-Lead - US, RETROPERITNL ABD,  LTD - POC  Hemoccult Bld/Stl  - Urinalysis, Routine w reflex microscopic  - Vitamin B12 - Iron and TIBC - PSA - Testosterone - HM DIABETES FOOT EXAM - LOW EXTREMITY NEUR EXAM DOCUM - CBC with Differential/Platelet - BASIC METABOLIC PANEL WITH GFR - Hepatic function panel - Magnesium - Lipid panel - TSH - Hemoglobin A1c - VITAMIN D 25 Hydroxy   2. Essential hypertension  - Microalbumin / creatinine urine ratio - EKG 12-Lead - US, RETROPERITNL ABD,  LTD - TSH  3. Hyperlipidemia  - EKG 12-Lead - US, RETROPERITNL ABD,  LTD - Lipid panel - TSH  4. Diabetes mellitus without complication (HCC)  - Microalbumin / creatinine urine ratio - EKG 12-Lead - US, RETROPERITNL ABD,  LTD - HM DIABETES FOOT EXAM - LOW EXTREMITY NEUR EXAM DOCUM - Hemoglobin A1c  5. Vitamin D deficiency  - VITAMIN D 25 Hydroxy   6. Idiopathic gout   7. GERD   8. Morbid obesity due to excess calories (HCC)  - phentermine (ADIPEX-P) 37.5 MG tablet; Take 1/2 to 1 tablet every morning for dieting & weightloss  Dispense: 30 tablet; Refill: 5  9. Special screening for malignant neoplasms, colon  - POC Hemoccult Bld/Stl (3-Cd Home Screen); Future  10. Prostate cancer screening  - PSA  11. Screening for ischemic heart disease  - EKG 12-Lead  12. Screening for AAA (aortic abdominal aneurysm)  - US, RETROPERITNL ABD,  LTD  13. Other fatigue  - Vitamin B12 - Iron and TIBC - Testosterone - CBC with Differential/Platelet - TSH  14. Chronic pain in right shoulder  - inject with Dexamethasone 10 mg to the trigger point   15. Medication management  - Urinalysis, Routine w reflex microscopic - CBC with Differential/Platelet - BASIC METABOLIC PANEL WITH GFR - Hepatic function panel - Magnesium  16. Screening examination for pulmonary tuberculosis  - PPD   Continue prudent diet as discussed, weight control, BP monitoring, regular exercise, and medications as discussed.  Discussed med effects  and SE's. Routine screening labs and tests as requested with regular follow-up as recommended. Over 40 minutes of exam, counseling, chart review and high complex critical decision making was performed

## 2016-03-27 NOTE — Patient Instructions (Signed)
reventive Care for Adults  A healthy lifestyle and preventive care can promote health and wellness. Preventive health guidelines for men include the following key practices:  A routine yearly physical is a good way to check with your health care provider about your health and preventative screening. It is a chance to share any concerns and updates on your health and to receive a thorough exam.  Visit your dentist for a routine exam and preventative care every 6 months. Brush your teeth twice a day and floss once a day. Good oral hygiene prevents tooth decay and gum disease.  The frequency of eye exams is based on your age, health, family medical history, use of contact lenses, and other factors. Follow your health care provider's recommendations for frequency of eye exams.  Eat a healthy diet. Foods such as vegetables, fruits, whole grains, low-fat dairy products, and lean protein foods contain the nutrients you need without too many calories. Decrease your intake of foods high in solid fats, added sugars, and salt. Eat the right amount of calories for you.Get information about a proper diet from your health care provider, if necessary.  Regular physical exercise is one of the most important things you can do for your health. Most adults should get at least 150 minutes of moderate-intensity exercise (any activity that increases your heart rate and causes you to sweat) each week. In addition, most adults need muscle-strengthening exercises on 2 or more days a week.  Maintain a healthy weight. The body mass index (BMI) is a screening tool to identify possible weight problems. It provides an estimate of body fat based on height and weight. Your health care provider can find your BMI and can help you achieve or maintain a healthy weight.For adults 20 years and older:  A BMI below 18.5 is considered underweight.  A BMI of 18.5 to 24.9 is normal.  A BMI of 25 to 29.9 is considered overweight.  A  BMI of 30 and above is considered obese.  Maintain normal blood lipids and cholesterol levels by exercising and minimizing your intake of saturated fat. Eat a balanced diet with plenty of fruit and vegetables. Blood tests for lipids and cholesterol should begin at age 50 and be repeated every 5 years. If your lipid or cholesterol levels are high, you are over 50, or you are at high risk for heart disease, you may need your cholesterol levels checked more frequently.Ongoing high lipid and cholesterol levels should be treated with medicines if diet and exercise are not working.  If you smoke, find out from your health care provider how to quit. If you do not use tobacco, do not start.  Lung cancer screening is recommended for adults aged 55-80 years who are at high risk for developing lung cancer because of a history of smoking. A yearly low-dose CT scan of the lungs is recommended for people who have at least a 30-pack-year history of smoking and are a current smoker or have quit within the past 15 years. A pack year of smoking is smoking an average of 1 pack of cigarettes a day for 1 year (for example: 1 pack a day for 30 years or 2 packs a day for 15 years). Yearly screening should continue until the smoker has stopped smoking for at least 15 years. Yearly screening should be stopped for people who develop a health problem that would prevent them from having lung cancer treatment.  If you choose to drink alcohol, do not have more  than 2 drinks per day. One drink is considered to be 12 ounces (355 mL) of beer, 5 ounces (148 mL) of wine, or 1.5 ounces (44 mL) of liquor.  Avoid use of street drugs. Do not share needles with anyone. Ask for help if you need support or instructions about stopping the use of drugs.  High blood pressure causes heart disease and increases the risk of stroke. Your blood pressure should be checked at least every 1-2 years. Ongoing high blood pressure should be treated with  medicines, if weight loss and exercise are not effective.  If you are 47-70 years old, ask your health care provider if you should take aspirin to prevent heart disease.  Diabetes screening involves taking a blood sample to check your fasting blood sugar level. This should be done once every 3 years, after age 4, if you are within normal weight and without risk factors for diabetes. Testing should be considered at a younger age or be carried out more frequently if you are overweight and have at least 1 risk factor for diabetes.  Colorectal cancer can be detected and often prevented. Most routine colorectal cancer screening begins at the age of 64 and continues through age 39. However, your health care provider may recommend screening at an earlier age if you have risk factors for colon cancer. On a yearly basis, your health care provider may provide home test kits to check for hidden blood in the stool. Use of a small camera at the end of a tube to directly examine the colon (sigmoidoscopy or colonoscopy) can detect the earliest forms of colorectal cancer. Talk to your health care provider about this at age 38, when routine screening begins. Direct exam of the colon should be repeated every 5-10 years through age 54, unless early forms of precancerous polyps or small growths are found.   Talk with your health care provider about prostate cancer screening.  Testicular cancer screening isrecommended for adult males. Screening includes self-exam, a health care provider exam, and other screening tests. Consult with your health care provider about any symptoms you have or any concerns you have about testicular cancer.  Use sunscreen. Apply sunscreen liberally and repeatedly throughout the day. You should seek shade when your shadow is shorter than you. Protect yourself by wearing long sleeves, pants, a wide-brimmed hat, and sunglasses year round, whenever you are outdoors.  Once a month, do a whole-body  skin exam, using a mirror to look at the skin on your back. Tell your health care provider about new moles, moles that have irregular borders, moles that are larger than a pencil eraser, or moles that have changed in shape or color.  Stay current with required vaccines (immunizations).  Influenza vaccine. All adults should be immunized every year.  Tetanus, diphtheria, and acellular pertussis (Td, Tdap) vaccine. An adult who has not previously received Tdap or who does not know his vaccine status should receive 1 dose of Tdap. This initial dose should be followed by tetanus and diphtheria toxoids (Td) booster doses every 10 years. Adults with an unknown or incomplete history of completing a 3-dose immunization series with Td-containing vaccines should begin or complete a primary immunization series including a Tdap dose. Adults should receive a Td booster every 10 years.  Varicella vaccine. An adult without evidence of immunity to varicella should receive 2 doses or a second dose if he has previously received 1 dose.  Human papillomavirus (HPV) vaccine. Males aged 13-21 years who have not  received the vaccine previously should receive the 3-dose series. Males aged 22-26 years may be immunized. Immunization is recommended through the age of 26 years for any male who has sex with males and did not get any or all doses earlier. Immunization is recommended for any person with an immunocompromised condition through the age of 26 years if he did not get any or all doses earlier. During the 3-dose series, the second dose should be obtained 4-8 weeks after the first dose. The third dose should be obtained 24 weeks after the first dose and 16 weeks after the second dose.  Zoster vaccine. One dose is recommended for adults aged 60 years or older unless certain conditions are present.    PREVNAR  - Pneumococcal 13-valent conjugate (PCV13) vaccine. When indicated, a person who is uncertain of his immunization  history and has no record of immunization should receive the PCV13 vaccine. An adult aged 19 years or older who has certain medical conditions and has not been previously immunized should receive 1 dose of PCV13 vaccine. This PCV13 should be followed with a dose of pneumococcal polysaccharide (PPSV23) vaccine. The PPSV23 vaccine dose should be obtained at least 8 weeks after the dose of PCV13 vaccine. An adult aged 19 years or older who has certain medical conditions and previously received 1 or more doses of PPSV23 vaccine should receive 1 dose of PCV13. The PCV13 vaccine dose should be obtained 1 or more years after the last PPSV23 vaccine dose.    PNEUMOVAX - Pneumococcal polysaccharide (PPSV23) vaccine. When PCV13 is also indicated, PCV13 should be obtained first. All adults aged 65 years and older should be immunized. An adult younger than age 65 years who has certain medical conditions should be immunized. Any person who resides in a nursing home or long-term care facility should be immunized. An adult smoker should be immunized. People with an immunocompromised condition and certain other conditions should receive both PCV13 and PPSV23 vaccines. People with human immunodeficiency virus (HIV) infection should be immunized as soon as possible after diagnosis. Immunization during chemotherapy or radiation therapy should be avoided. Routine use of PPSV23 vaccine is not recommended for American Indians, [place] Natives, or people younger than 65 years unless there are medical conditions that require PPSV23 vaccine. When indicated, people who have unknown immunization and have no record of immunization should receive PPSV23 vaccine. One-time revaccination 5 years after the first dose of PPSV23 is recommended for people aged 19-64 years who have chronic kidney failure, nephrotic syndrome, asplenia, or immunocompromised conditions. People who received 1-2 doses of PPSV23 before age 65 years should receive another  dose of PPSV23 vaccine at age 65 years or later if at least 5 years have passed since the previous dose. Doses of PPSV23 are not needed for people immunized with PPSV23 at or after age 65 years.    Hepatitis A vaccine. Adults who wish to be protected from this disease, have certain high-risk conditions, work with hepatitis A-infected animals, work in hepatitis A research labs, or travel to or work in countries with a high rate of hepatitis A should be immunized. Adults who were previously unvaccinated and who anticipate close contact with an international adoptee during the first 60 days after arrival in the [place] from a country with a high rate of hepatitis A should be immunized.    Hepatitis B vaccine. Adults should be immunized if they wish to be protected from this disease, have certain high-risk conditions, may be exposed to   blood or other infectious body fluids, are household contacts or sex partners of hepatitis B positive people, are clients or workers in certain care facilities, or travel to or work in countries with a high rate of hepatitis B.   Preventive Service / Frequency   Ages 1840 to 4264  Blood pressure check.  Lipid and cholesterol check  Lung cancer screening. / Every year if you are aged 55-80 years and have a 30-pack-year history of smoking and currently smoke or have quit within the past 15 years. Yearly screening is stopped once you have quit smoking for at least 15 years or develop a health problem that would prevent you from having lung cancer treatment.  Fecal occult blood test (FOBT) of stool. / Every year beginning at age 51 and continuing until age 51. You may not have to do this test if you get a colonoscopy every 10 years.  Flexible sigmoidoscopy** or colonoscopy.** / Every 5 years for a flexible sigmoidoscopy or every 10 years for a colonoscopy beginning at age 51 and continuing until age 51. Screening for abdominal aortic aneurysm (AAA)  by ultrasound is  recommended for people who have history of high blood pressure or who are current or former smokers. +++++++++++++++++++++++++++++++++++ Recommend Adult Low Dose Aspirin or  coated  Aspirin 81 mg daily  To reduce risk of Colon Cancer 20 %,  Skin Cancer 26 % ,  Melanoma 46%  and  Pancreatic cancer 60% ++++++++++++++++++++++++++++++++++++++++++++++++++++++ Vitamin D goal  is between 70-100.  Please make sure that you are taking your Vitamin D as directed.  It is very important as a natural anti-inflammatory  helping hair, skin, and nails, as well as reducing stroke and heart attack risk.  It helps your bones and helps with mood. It also decreases numerous cancer risks so please take it as directed.  Low Vit D is associated with a 200-300% higher risk for CANCER  and 200-300% higher risk for HEART   ATTACK  &  STROKE.   .....................................Marland Kitchen. It is also associated with higher death rate at younger ages,  autoimmune diseases like Rheumatoid arthritis, Lupus, Multiple Sclerosis.    Also many other serious conditions, like depression, Alzheimer's Dementia, infertility, muscle aches, fatigue, fibromyalgia - just to name a few. ++++++++++++++++++++++++++++++++++++++++++++++++ Recommend the book "The END of DIETING" by Dr Monico HoarJoel Fuhrman  & the book "The END of DIABETES " by Dr Monico HoarJoel Fuhrman At [email or web address] - get book & Audio CD's    Being diabetic has a  300% increased risk for heart attack, stroke, cancer, and alzheimer- type vascular dementia. It is very important that you work harder with diet by avoiding all foods that are white. Avoid white rice (brown & wild rice is OK), white potatoes (sweetpotatoes in moderation is OK), White bread or wheat bread or anything made out of white flour like bagels, donuts, rolls, buns, biscuits, cakes, pastries, cookies, pizza crust, and pasta (made from white flour & egg whites) - vegetarian pasta or spinach or wheat pasta is OK. Multigrain breads  like Arnold's or [employer], or multigrain sandwich thins or flatbreads.  Diet, exercise and weight loss can reverse and cure diabetes in the early stages.  Diet, exercise and weight loss is very important in the control and prevention of complications of diabetes which affects every system in your body, ie. Brain - dementia/stroke, eyes - glaucoma/blindness, heart - heart attack/heart failure, kidneys - dialysis, stomach - gastric paralysis, intestines - malabsorption, nerves - severe painful neuritis, circulation -  gangrene & loss of a leg(s), and finally cancer and Alzheimers.    I recommend avoid fried & greasy foods,  sweets/candy, white rice (brown or wild rice or Quinoa is OK), white potatoes (sweet potatoes are OK) - anything made from white flour - bagels, doughnuts, rolls, buns, biscuits,white and wheat breads, pizza crust and traditional pasta made of white flour & egg white(vegetarian pasta or spinach or wheat pasta is OK).  Multi-grain bread is OK - like multi-grain flat bread or sandwich thins. Avoid alcohol in excess. Exercise is also important.    Eat all the vegetables you want - avoid meat, especially red meat and dairy - especially cheese.  Cheese is the most concentrated form of trans-fats which is the worst thing to clog up our arteries. Veggie cheese is OK which can be found in the fresh produce section at [employer] or [employer] or [employer]  ++++++++++++++++++++++++++++++++++++++++++++++++++ DASH Eating Plan  DASH stands for "Dietary Approaches to Stop Hypertension."   The DASH eating plan is a healthy eating plan that has been shown to reduce high blood pressure (hypertension). Additional health benefits may include reducing the risk of type 2 diabetes mellitus, heart disease, and stroke. The DASH eating plan may also help with weight loss. WHAT DO I NEED TO KNOW ABOUT THE DASH EATING PLAN? For the DASH eating plan, you will follow these general guidelines:  Choose  foods with a percent daily value for sodium of less than 5% (as listed on the food label).  Use salt-free seasonings or herbs instead of table salt or sea salt.  Check with your health care provider or pharmacist before using salt substitutes.  Eat lower-sodium products, often labeled as "lower sodium" or "no salt added."  Eat fresh foods.  Eat more vegetables, fruits, and low-fat dairy products.  Choose whole grains. Look for the word "whole" as the first word in the ingredient list.  Choose fish   Limit sweets, desserts, sugars, and sugary drinks.  Choose heart-healthy fats.  Eat veggie cheese   Eat more home-cooked food and less restaurant, buffet, and fast food.  Limit fried foods.  Cook foods using methods other than frying.  Limit canned vegetables. If you do use them, rinse them well to decrease the sodium.  When eating at a restaurant, ask that your food be prepared with less salt, or no salt if possible.                      WHAT FOODS CAN I EAT? Read Dr Francis DowseJoel Fuhrman's books on The End of Dieting & The End of Diabetes  Grains Whole grain or whole wheat bread. Brown rice. Whole grain or whole wheat pasta. Quinoa, bulgur, and whole grain cereals. Low-sodium cereals. Corn or whole wheat flour tortillas. Whole grain cornbread. Whole grain crackers. Low-sodium crackers.  Vegetables Fresh or frozen vegetables (raw, steamed, roasted, or grilled). Low-sodium or reduced-sodium tomato and vegetable juices. Low-sodium or reduced-sodium tomato sauce and paste. Low-sodium or reduced-sodium canned vegetables.   Fruits All fresh, canned (in natural juice), or frozen fruits.  Protein Products  All fish and seafood.  Dried beans, peas, or lentils. Unsalted nuts and seeds. Unsalted canned beans.  Dairy Low-fat dairy products, such as skim or 1% milk, 2% or reduced-fat cheeses, low-fat ricotta or cottage cheese, or plain low-fat yogurt. Low-sodium or reduced-sodium  cheeses.  Fats and Oils Tub margarines without trans fats. Light or reduced-fat mayonnaise and salad dressings (reduced sodium). Avocado. Safflower,  olive, or canola oils. Natural peanut or almond butter.  Other Unsalted popcorn and pretzels. The items listed above may not be a complete list of recommended foods or beverages. Contact your dietitian for more options.  +++++++++++++++++++++++++++++++++++++++++++  WHAT FOODS ARE NOT RECOMMENDED? Grains/ White flour or wheat flour White bread. White pasta. White rice. Refined cornbread. Bagels and croissants. Crackers that contain trans fat.  Vegetables  Creamed or fried vegetables. Vegetables in a . Regular canned vegetables. Regular canned tomato sauce and paste. Regular tomato and vegetable juices.  Fruits Dried fruits. Canned fruit in light or heavy syrup. Fruit juice.  Meat and Other Protein Products Meat in general - RED meat & White meat.  Fatty cuts of meat. Ribs, chicken wings, all processed meats as bacon, sausage, bologna, salami, fatback, hot dogs, bratwurst and packaged luncheon meats.  Dairy Whole or 2% milk, cream, half-and-half, and cream cheese. Whole-fat or sweetened yogurt. Full-fat cheeses or blue cheese. Non-dairy creamers and whipped toppings. Processed cheese, cheese spreads, or cheese curds.  Condiments Onion and garlic salt, seasoned salt, table salt, and sea salt. Canned and packaged gravies. Worcestershire sauce. Tartar sauce. Barbecue sauce. Teriyaki sauce. Soy sauce, including reduced sodium. Steak sauce. Fish sauce. Oyster sauce. Cocktail sauce. Horseradish. Ketchup and mustard. Meat flavorings and tenderizers. Bouillon cubes. Hot sauce. Tabasco sauce. Marinades. Taco seasonings. Relishes.  Fats and Oils Butter, stick margarine, lard, shortening and bacon fat. Coconut, palm kernel, or palm oils. Regular salad dressings.  Pickles and olives. Salted popcorn and pretzels.  The items listed above may not be a  complete list of foods and beverages to avoid.

## 2016-03-28 LAB — TESTOSTERONE: Testosterone: 257 ng/dL (ref 250–827)

## 2016-03-28 LAB — HEMOGLOBIN A1C
Hgb A1c MFr Bld: 8 % — ABNORMAL HIGH (ref <5.7)
Mean Plasma Glucose: 183 mg/dL

## 2016-03-28 LAB — URINALYSIS, ROUTINE W REFLEX MICROSCOPIC
Bilirubin Urine: NEGATIVE
Hgb urine dipstick: NEGATIVE
Ketones, ur: NEGATIVE
Leukocytes, UA: NEGATIVE
Nitrite: NEGATIVE
Protein, ur: NEGATIVE
Specific Gravity, Urine: 1.011 (ref 1.001–1.035)
pH: 5.5 (ref 5.0–8.0)

## 2016-03-28 LAB — VITAMIN D 25 HYDROXY (VIT D DEFICIENCY, FRACTURES): Vit D, 25-Hydroxy: 80 ng/mL (ref 30–100)

## 2016-03-28 LAB — MICROALBUMIN / CREATININE URINE RATIO
Creatinine, Urine: 64 mg/dL (ref 20–370)
Microalb Creat Ratio: 3 mcg/mg creat (ref <30)
Microalb, Ur: 0.2 mg/dL

## 2016-03-29 ENCOUNTER — Encounter: Payer: Self-pay | Admitting: Internal Medicine

## 2016-03-29 MED ORDER — DEXAMETHASONE SODIUM PHOSPHATE 10 MG/ML IJ SOLN: 10.0000 mg | Freq: Once | INTRAMUSCULAR | Status: DC

## 2016-04-03 ENCOUNTER — Encounter: Payer: Self-pay | Admitting: Internal Medicine

## 2016-04-03 LAB — TB SKIN TEST
Induration: 0 mm
TB Skin Test: NEGATIVE

## 2016-04-08 ENCOUNTER — Encounter: Payer: Self-pay | Admitting: Internal Medicine

## 2016-04-14 ENCOUNTER — Other Ambulatory Visit: Payer: Self-pay | Admitting: Internal Medicine

## 2016-04-14 DIAGNOSIS — E119 Type 2 diabetes mellitus without complications: Secondary | ICD-10-CM

## 2016-05-18 ENCOUNTER — Other Ambulatory Visit: Payer: Self-pay | Admitting: Internal Medicine

## 2016-06-14 ENCOUNTER — Other Ambulatory Visit: Payer: Self-pay | Admitting: Internal Medicine

## 2016-06-16 ENCOUNTER — Other Ambulatory Visit: Payer: Self-pay | Admitting: Internal Medicine

## 2016-06-28 ENCOUNTER — Other Ambulatory Visit: Payer: Self-pay | Admitting: Physician Assistant

## 2016-06-28 DIAGNOSIS — E349 Endocrine disorder, unspecified: Secondary | ICD-10-CM

## 2016-06-28 NOTE — Telephone Encounter (Signed)
PLEASE CALL ANDROGEL

## 2016-07-03 ENCOUNTER — Encounter: Payer: Self-pay | Admitting: Physician Assistant

## 2016-07-03 ENCOUNTER — Ambulatory Visit (INDEPENDENT_AMBULATORY_CARE_PROVIDER_SITE_OTHER): Payer: BLUE CROSS/BLUE SHIELD | Admitting: Physician Assistant

## 2016-07-03 VITALS — BP 122/68 | HR 84 | Temp 97.9°F | Resp 14 | Ht 71.0 in | Wt 237.0 lb

## 2016-07-03 DIAGNOSIS — J0100 Acute maxillary sinusitis, unspecified: Secondary | ICD-10-CM | POA: Diagnosis not present

## 2016-07-03 MED ORDER — AZITHROMYCIN 250 MG PO TABS: ORAL_TABLET | ORAL | 1 refills | Status: AC

## 2016-07-03 MED ORDER — PROMETHAZINE-CODEINE 6.25-10 MG/5ML PO SYRP: 5.0000 mL | ORAL_SOLUTION | Freq: Four times a day (QID) | ORAL | 0 refills | Status: DC | PRN

## 2016-07-03 MED ORDER — MOMETASONE FUROATE 50 MCG/ACT NA SUSP: 2.0000 | Freq: Every day | NASAL | 2 refills | Status: DC

## 2016-07-03 NOTE — Progress Notes (Signed)
Subjective:    Patient ID: Shaun [name], male    DOB: 1966-07-13, 50 y.o.   MRN: [id number]  HPI 50 y.o. WM presents with sinus infection x 2 weeks.  No fever, chills. Has tried OTC cough syrup, aleve sinus and flu, nyquil without help.  Has sore throat, sinus pain, sinus drainage, cough just this AM productive, wheezing but no SOB, CP.   Blood pressure 122/68, pulse 84, temperature 97.9 F (36.6 C), resp. rate 14, height 5\' 11"  (1.803 m), weight 237 lb (107.5 kg), SpO2 98 %.  Medications Current Outpatient Prescriptions on File Prior to Visit  Medication Sig  . ANDROGEL PUMP 20.25 MG/ACT (1.62%) GEL APPLY 2 PUMPS ON EACH ARM (4 TOTAL PUMPS) DAILY  . aspirin 81 MG tablet Take 81 mg by mouth daily.  Marland Kitchen. atenolol (TENORMIN) 100 MG tablet TAKE 1 TABLET (100 MG TOTAL) BY MOUTH DAILY.  Marland Kitchen. Cholecalciferol (VITAMIN D) 2000 units CAPS Take 4 capsules by mouth daily.   Marland Kitchen. glyBURIDE (DIABETA) 5 MG tablet TAKE 1 TABLET (5 MG TOTAL) BY MOUTH 3 (THREE) TIMES DAILY WITH MEALS.  Marland Kitchen. JANUVIA 100 MG tablet TAKE 1 TABLET (100 MG TOTAL) BY MOUTH DAILY.  . metFORMIN (GLUCOPHAGE-XR) 500 MG 24 hr tablet TAKE 2 TABLETS BY MOUTH TWICE DAILY WITH FOOD   Current Facility-Administered Medications on File Prior to Visit  Medication  . dexamethasone (DECADRON) injection 10 mg    Problem list He has Hypertension; Hyperlipidemia; T2_NIDDM; GERD; Asthma; Gout; Testosterone deficiency; Medication management; Vitamin D deficiency; Morbid obesity (BMI 30.54); and Encounter for general adult medical examination with abnormal findings on his problem list.  Review of Systems  Constitutional: Negative for chills, diaphoresis and fever.  HENT: Positive for congestion, sinus pressure, sneezing and sore throat. Negative for ear pain, trouble swallowing and voice change.   Eyes: Negative.   Respiratory: Positive for cough. Negative for chest tightness, shortness of breath and wheezing.   Cardiovascular: Negative.    Gastrointestinal: Negative.   Genitourinary: Negative.   Musculoskeletal: Negative for neck pain.  Neurological: Negative.  Negative for headaches.       Objective:   Physical Exam  Constitutional: He is oriented to person, place, and time. He appears well-developed and well-nourished.  HENT:  Head: Normocephalic and atraumatic.  Right Ear: Hearing and tympanic membrane normal.  Left Ear: Hearing and tympanic membrane normal.  Nose: Right sinus exhibits maxillary sinus tenderness. Left sinus exhibits maxillary sinus tenderness.  Mouth/Throat: Uvula is midline and mucous membranes are normal. Posterior oropharyngeal erythema present. No oropharyngeal exudate, posterior oropharyngeal edema or tonsillar abscesses.  Eyes: Conjunctivae are normal. Pupils are equal, round, and reactive to light.  Neck: Normal range of motion. Neck supple.  Cardiovascular: Normal rate and regular rhythm.   Pulmonary/Chest: Effort normal and breath sounds normal.  Abdominal: Soft. Bowel sounds are normal.  Musculoskeletal: Normal range of motion.  Lymphadenopathy:    He has no cervical adenopathy.  Neurological: He is alert and oriented to person, place, and time.  Skin: Skin is warm and dry. No rash noted.       Assessment & Plan:  1. Acute maxillary sinusitis, recurrence not specified Will hold the zpak and take if she is not getting better, increase fluids, rest, cont allergy pill - azithromycin (ZITHROMAX) 250 MG tablet; Take 2 tablets (500 mg) on  Day 1,  followed by 1 tablet (250 mg) once daily on Days 2 through 5.  Dispense: 6 each; Refill: 1 - mometasone (NASONEX)  50 MCG/ACT nasal spray; Place 2 sprays into the nose daily.  Dispense: 17 g; Refill: 2 - promethazine-codeine (PHENERGAN WITH CODEINE) 6.25-10 MG/5ML syrup; Take 5 mLs by mouth every 6 (six) hours as needed for cough. Max: 20mL per day  Dispense: 240 mL; Refill: 0

## 2016-07-03 NOTE — Patient Instructions (Signed)
Please pick one of the over the counter allergy medications below and take it once daily for allergies.  Claritin or loratadine cheapest but likely the weakest  Zyrtec or certizine at night because it can make you sleepy The strongest is allegra or fexafinadine  Cheapest at [facility], [employer], [employer]  Can do a steroid nasal spary 1-2 sparys at night each nostril. Remember to spray each nostril twice towards the outer part of your eye.  Do not sniff but instead pinch your nose and tilt your head back to help the medicine get into your sinuses.  The best time to do this is at bedtime. Stop if you get blurred vision or nose bleeds.    HOW TO TREAT VIRAL COUGH AND COLD SYMPTOMS:  -Symptoms usually last at least 1 week with the worst symptoms being around day 4.  - colds usually start with a sore throat and end with a cough, and the cough can take 2 weeks to get better.  -No antibiotics are needed for colds, flu, sore throats, cough, bronchitis UNLESS symptoms are longer than 7 days OR if you are getting better then get drastically worse.  -There are a lot of combination medications (Dayquil, Nyquil, Vicks 44, tyelnol cold and sinus, ETC). Please look at the ingredients on the back so that you are treating the correct symptoms and not doubling up on medications/ingredients.    Medicines you can use  Nasal congestion  - pseudoephedrine (Sudafed)- behind the counter, do not use if you have high blood pressure, medicine that have -D in them.  - phenylephrine (Sudafed PE) -Dextormethorphan + chlorpheniramine (Coridcidin HBP)- okay if you have high blood pressure -Oxymetazoline (Afrin) nasal spray- LIMIT to 3 days -Saline nasal spray -Neti pot (used distilled or bottled water)  Ear pain/congestion  -pseudoephedrine (sudafed) - Nasonex/flonase nasal spray  Fever  -Acetaminophen (Tyelnol) -Ibuprofen (Advil, motrin, aleve)  Sore Throat  -Acetaminophen (Tyelnol) -Ibuprofen (Advil, motrin,  aleve) -Drink a lot of water -Gargle with salt water - Rest your voice (don't talk) -Throat sprays -Cough drops  Body Aches  -Acetaminophen (Tyelnol) -Ibuprofen (Advil, motrin, aleve)  Headache  -Acetaminophen (Tyelnol) -Ibuprofen (Advil, motrin, aleve) - Exedrin, Exedrin Migraine  Allergy symptoms (cough, sneeze, runny nose, itchy eyes) -Claritin or loratadine cheapest but likely the weakest  -Zyrtec or certizine at night because it can make you sleepy -The strongest is allegra or fexafinadine  Cheapest at [facility], [employer], [employer]  Cough  -Dextromethorphan (Delsym)- medicine that has DM in it -Guafenesin (Mucinex/Robitussin) - cough drops - drink lots of water  Chest Congestion  -Guafenesin (Mucinex/Robitussin)  Red Itchy Eyes  - Naphcon-A  Upset Stomach  - Bland diet (nothing spicy, greasy, fried, and high acid foods like tomatoes, oranges, berries) -OKAY- cereal, bread, soup, crackers, rice -Eat smaller more frequent meals -reduce caffeine, no alcohol -Loperamide (Imodium-AD) if diarrhea -Prevacid for heart burn  General health when sick  -Hydration -wash your hands frequently -keep surfaces clean -change pillow cases and sheets often -Get fresh air but do not exercise strenuously -Vitamin D, double up on it - Vitamin C -Zinc

## 2016-07-14 ENCOUNTER — Ambulatory Visit (INDEPENDENT_AMBULATORY_CARE_PROVIDER_SITE_OTHER): Payer: BLUE CROSS/BLUE SHIELD | Admitting: Internal Medicine

## 2016-07-15 ENCOUNTER — Encounter: Payer: Self-pay | Admitting: Internal Medicine

## 2016-07-19 NOTE — Progress Notes (Signed)
  Patient   Rescheduled   appointment   due to delayed OV   and   scheduling conflict

## 2016-07-30 ENCOUNTER — Encounter: Payer: Self-pay | Admitting: Internal Medicine

## 2016-09-10 ENCOUNTER — Other Ambulatory Visit: Payer: Self-pay | Admitting: Internal Medicine

## 2016-10-01 ENCOUNTER — Other Ambulatory Visit: Payer: Self-pay | Admitting: Physician Assistant

## 2016-10-15 ENCOUNTER — Ambulatory Visit (INDEPENDENT_AMBULATORY_CARE_PROVIDER_SITE_OTHER): Payer: BLUE CROSS/BLUE SHIELD | Admitting: Internal Medicine

## 2016-10-15 ENCOUNTER — Encounter: Payer: Self-pay | Admitting: Internal Medicine

## 2016-10-15 VITALS — BP 130/84 | HR 76 | Temp 97.5°F | Resp 16 | Ht 71.75 in | Wt 234.4 lb

## 2016-10-15 DIAGNOSIS — M1000 Idiopathic gout, unspecified site: Secondary | ICD-10-CM

## 2016-10-15 DIAGNOSIS — E559 Vitamin D deficiency, unspecified: Secondary | ICD-10-CM

## 2016-10-15 DIAGNOSIS — I10 Essential (primary) hypertension: Secondary | ICD-10-CM | POA: Diagnosis not present

## 2016-10-15 DIAGNOSIS — E119 Type 2 diabetes mellitus without complications: Secondary | ICD-10-CM

## 2016-10-15 DIAGNOSIS — E349 Endocrine disorder, unspecified: Secondary | ICD-10-CM

## 2016-10-15 DIAGNOSIS — Z79899 Other long term (current) drug therapy: Secondary | ICD-10-CM | POA: Diagnosis not present

## 2016-10-15 DIAGNOSIS — E782 Mixed hyperlipidemia: Secondary | ICD-10-CM

## 2016-10-15 DIAGNOSIS — M25511 Pain in right shoulder: Secondary | ICD-10-CM | POA: Diagnosis not present

## 2016-10-15 LAB — CBC WITH DIFFERENTIAL/PLATELET
Basophils Absolute: 0 cells/uL (ref 0–200)
Basophils Relative: 0 %
Eosinophils Absolute: 360 cells/uL (ref 15–500)
Eosinophils Relative: 5 %
HCT: 48.1 % (ref 38.5–50.0)
Hemoglobin: 16.4 g/dL (ref 13.2–17.1)
Lymphocytes Relative: 45 %
Lymphs Abs: 3240 cells/uL (ref 850–3900)
MCH: 28.1 pg (ref 27.0–33.0)
MCHC: 34.1 g/dL (ref 32.0–36.0)
MCV: 82.5 fL (ref 80.0–100.0)
MPV: 9.5 fL (ref 7.5–12.5)
Monocytes Absolute: 576 cells/uL (ref 200–950)
Monocytes Relative: 8 %
Neutro Abs: 3024 cells/uL (ref 1500–7800)
Neutrophils Relative %: 42 %
Platelets: 179 10*3/uL (ref 140–400)
RBC: 5.83 MIL/uL — ABNORMAL HIGH (ref 4.20–5.80)
RDW: 14.4 % (ref 11.0–15.0)
WBC: 7.2 10*3/uL (ref 3.8–10.8)

## 2016-10-15 LAB — HEPATIC FUNCTION PANEL
ALT: 45 U/L (ref 9–46)
AST: 31 U/L (ref 10–35)
Albumin: 4.2 g/dL (ref 3.6–5.1)
Alkaline Phosphatase: 51 U/L (ref 40–115)
Bilirubin, Direct: 0.1 mg/dL (ref <=0.2)
Indirect Bilirubin: 0.5 mg/dL (ref 0.2–1.2)
Total Bilirubin: 0.6 mg/dL (ref 0.2–1.2)
Total Protein: 7 g/dL (ref 6.1–8.1)

## 2016-10-15 LAB — LIPID PANEL
Cholesterol: 156 mg/dL (ref <200)
HDL: 22 mg/dL — ABNORMAL LOW (ref >40)
LDL Cholesterol: 65 mg/dL (ref <100)
Total CHOL/HDL Ratio: 7.1 Ratio — ABNORMAL HIGH (ref <5.0)
Triglycerides: 343 mg/dL — ABNORMAL HIGH (ref <150)
VLDL: 69 mg/dL — ABNORMAL HIGH (ref <30)

## 2016-10-15 LAB — BASIC METABOLIC PANEL WITH GFR
BUN: 18 mg/dL (ref 7–25)
CO2: 29 mmol/L (ref 20–31)
Calcium: 9.8 mg/dL (ref 8.6–10.3)
Chloride: 98 mmol/L (ref 98–110)
Creat: 0.72 mg/dL (ref 0.70–1.33)
GFR, Est African American: >89 mL/min (ref >=60)
GFR, Est Non African American: >89 mL/min (ref >=60)
Glucose, Bld: 178 mg/dL — ABNORMAL HIGH (ref 65–99)
Potassium: 4.6 mmol/L (ref 3.5–5.3)
Sodium: 137 mmol/L (ref 135–146)

## 2016-10-15 MED ORDER — DEXAMETHASONE SODIUM PHOSPHATE 10 MG/ML IJ SOLN
10.0000 mg | Freq: Once | INTRAMUSCULAR | Status: AC
  Administered 2016-10-15: 10 mg via INTRAMUSCULAR

## 2016-10-15 NOTE — Patient Instructions (Signed)

## 2016-10-15 NOTE — Progress Notes (Signed)
This very nice 51 y.o. Shaun [name] presents for 3 month follow up with Hypertension, Hyperlipidemia, T2_NIDDM, Testosterone and Vitamin D Deficiency. Patient also has hx/o gout/podagra apparently controlled on his meds.  Today he's also c/o pain at his Rt shoulder anterior  joint line. Denies injury.       Patient is treated for HTN (1999) & BP has been controlled at home. Today's BP is at goal - 130/84. Patient has had no complaints of any cardiac type chest pain, palpitations, dyspnea/orthopnea/PND, dizziness, claudication, or dependent edema.     Hyperlipidemia is controlled with diet & meds. Patient denies myalgias or other med SE's. Last Lipids were at goal albeit elevated Trig's: Lab Results  Component Value Date   CHOL 148 03/27/2016   HDL 25 (L) 03/27/2016   LDLCALC 74 03/27/2016   TRIG 245 (H) 03/27/2016   CHOLHDL 5.9 (H) 03/27/2016      Also, the patient has history of Morbid Obesity (BMI 32+) and T2_NIDDM (2004) and has had no symptoms of reactive hypoglycemia, diabetic polys, paresthesias or visual blurring.   He admits glucoses are variable and elevated consequent of his Gluttony and his last A1c was not at goal: Lab Results  Component Value Date   HGBA1C 8.0 (H) 03/27/2016      Patient is on testosterone replacement by gel application with improved stamina & sense of well-being. Further, the patient also has history of Vitamin D Deficiency ("15" in 2009)  and supplements vitamin D without any suspected side-effects. Last vitamin D was at goal: Lab Results  Component Value Date   VD25OH 37 03/27/2016   Current Outpatient Prescriptions on File Prior to Visit  Medication Sig  . ANDROGEL PUMP 20.25 MG/ACT (1.62%) GEL APPLY 2 PUMPS ON EACH ARM (4 TOTAL PUMPS) DAILY  . aspirin 81 MG tablet Take 81 mg by mouth daily.  Marland Kitchen atenolol (TENORMIN) 100 MG tablet TAKE 1 TABLET (100 MG TOTAL) BY MOUTH DAILY.  Marland Kitchen Cholecalciferol (VITAMIN D) 2000 units CAPS Take 4 capsules by mouth daily.   Marland Kitchen  glyBURIDE (DIABETA) 5 MG tablet TAKE 1 TABLET (5 MG TOTAL) BY MOUTH 3 (THREE) TIMES DAILY WITH MEALS.  Marland Kitchen JANUVIA 100 MG tablet TAKE 1 TABLET (100 MG TOTAL) BY MOUTH DAILY.  . metFORMIN (GLUCOPHAGE-XR) 500 MG 24 hr tablet TAKE 2 TABLETS BY MOUTH TWICE DAILY WITH FOOD  . mometasone (NASONEX) 50 MCG/ACT nasal spray Place 2 sprays into the nose daily.   No current facility-administered medications on file prior to visit.    Allergies  Allergen Reactions  . Zocor [Simvastatin]     Headache  . Zoloft [Sertraline Hcl]     dysphona   PMHx:   Past Medical History:  Diagnosis Date  . Asthma   . Diabetes mellitus without complication (HCC)   . Fatty liver disease, nonalcoholic   . GERD (gastroesophageal reflux disease)   . Gout   . Hyperlipidemia   . Hypertension   . Hypogonadism male   . Obesity   . Venous insufficiency    Immunization History  Administered Date(s) Administered  . PPD Test 02/17/2014, 03/08/2015, 03/27/2016  . Pneumococcal-Unspecified 02/09/2013  . Tdap 02/09/2013   Past Surgical History:  Procedure Laterality Date  . ANTERIOR CRUCIATE LIGAMENT REPAIR Left 2001  . CYSTOSCOPY  1993  . ESOPHAGOGASTRODUODENOSCOPY  1995, 2002   gastritis, esophagititis  . KNEE ARTHROSCOPY Right 2002, 1995   FHx:    Reviewed / unchanged  SHx:    Reviewed /  unchanged  Systems Review:  Constitutional: Denies fever, chills, wt changes, headaches, insomnia, fatigue, night sweats, change in appetite. Eyes: Denies redness, blurred vision, diplopia, discharge, itchy, watery eyes.  ENT: Denies discharge, congestion, post nasal drip, epistaxis, sore throat, earache, hearing loss, dental pain, tinnitus, vertigo, sinus pain, snoring.  CV: Denies chest pain, palpitations, irregular heartbeat, syncope, dyspnea, diaphoresis, orthopnea, PND, claudication or edema. Respiratory: denies cough, dyspnea, DOE, pleurisy, hoarseness, laryngitis, wheezing.  Gastrointestinal: Denies dysphagia,  odynophagia, heartburn, reflux, water brash, abdominal pain or cramps, nausea, vomiting, bloating, diarrhea, constipation, hematemesis, melena, hematochezia  or hemorrhoids. Genitourinary: Denies dysuria, frequency, urgency, nocturia, hesitancy, discharge, hematuria or flank pain. Musculoskeletal: Denies arthralgias, myalgias, stiffness, jt. swelling, pain, limping or strain/sprain.  Skin: Denies pruritus, rash, hives, warts, acne, eczema or change in skin lesion(s). Neuro: No weakness, tremor, incoordination, spasms, paresthesia or pain. Psychiatric: Denies confusion, memory loss or sensory loss. Endo: Denies change in weight, skin or hair change.  Heme/Lymph: No excessive bleeding, bruising or enlarged lymph nodes.  Physical Exam  BP 130/84   Pulse 76   Temp 97.5 F (36.4 C)   Resp 16   Ht 5' 11.75" (1.822 m)   Wt 234 lb 6.4 oz (106.3 kg)   BMI 32.01 kg/m   Appears well nourished and in no distress.  Eyes: PERRLA, EOMs, conjunctiva no swelling or erythema. Sinuses: No frontal/maxillary tenderness ENT/Mouth: EAC's clear, TM's nl w/o erythema, bulging. Nares clear w/o erythema, swelling, exudates. Oropharynx clear without erythema or exudates. Oral hygiene is good. Tongue normal, non obstructing. Hearing intact.  Neck: Supple. Thyroid nl. Car 2+/2+ without bruits, nodes or JVD. Chest: Respirations nl with BS clear & equal w/o rales, rhonchi, wheezing or stridor.  Cor: Heart sounds normal w/ regular rate and rhythm without sig. murmurs, gallops, clicks, or rubs. Peripheral pulses normal and equal  without edema.  Abdomen: Soft & bowel sounds normal. Non-tender w/o guarding, rebound, hernias, masses, or organomegaly.  Lymphatics: Unremarkable.  Musculoskeletal: Full ROM with exception of decreased int/external rotation and abduction of the Right Shoulder due to pain w/tenderness along the anterior joint line. With informed consent and alcohol scrub prep -  1 ml of 10 mg of Dexamethasone  was injected into a trigger point of the anterior joint line.  Normal gait.  Skin: Warm, dry without exposed rashes, lesions or ecchymosis apparent.  Neuro: Cranial nerves intact, reflexes equal bilaterally. Sensory-motor testing grossly intact. Tendon reflexes grossly intact.  Pysch: Alert & oriented x 3.  Insight and judgement nl & appropriate. No ideations.  Assessment and Plan:   1. Essential hypertension  - Continue medication, monitor blood pressure at home.  - Continue DASH diet. Reminder to go to the ER if any CP,  SOB, nausea, dizziness, severe HA, changes vision/speech,  left arm numbness and tingling and jaw pain.  - CBC with Differential/Platelet - BASIC METABOLIC PANEL WITH GFR - Magnesium - TSH  2. Mixed hyperlipidemia  - Continue diet/meds, exercise,& lifestyle modifications.  - Continue monitor periodic cholesterol/liver & renal functions   - Hepatic function panel - Lipid panel - TSH  3. Diabetes mellitus without complication (HCC)  - Continue diet, exercise, lifestyle modifications.  - Monitor appropriate labs.  - Hemoglobin A1c - Insulin, random  4. Vitamin D deficiency  - Continue supplementation.  - VITAMIN D 25 Hydroxy   5. Idiopathic gout  - Uric acid  6. Testosterone deficiency  - Testosterone  7. Right shoulder pain  - dexamethasone (DECADRON) injection 10 mg; Inject  1 mL (10 mg total) into the muscle once.  8. Medication management  - CBC with Differential/Platelet - BASIC METABOLIC PANEL WITH GFR - Hepatic function panel - Magnesium - Lipid panel - TSH - Hemoglobin A1c - Insulin, random - VITAMIN D 25 Hydroxy - Testosterone - Uric acid       Recommended regular exercise, BP monitoring, weight control, and discussed med and SE's. Recommended labs to assess and monitor clinical status. Further disposition pending results of labs. Over 30 minutes of exam, counseling, chart review was performed

## 2016-10-16 LAB — HEMOGLOBIN A1C
Hgb A1c MFr Bld: 9 % — ABNORMAL HIGH (ref <5.7)
Mean Plasma Glucose: 212 mg/dL

## 2016-10-16 LAB — URIC ACID: Uric Acid, Serum: 8 mg/dL (ref 4.0–8.0)

## 2016-10-16 LAB — MAGNESIUM: Magnesium: 1.6 mg/dL (ref 1.5–2.5)

## 2016-10-16 LAB — TSH: TSH: 2.52 mIU/L (ref 0.40–4.50)

## 2016-10-16 LAB — TESTOSTERONE: Testosterone: 243 ng/dL — ABNORMAL LOW (ref 250–827)

## 2016-10-16 LAB — INSULIN, RANDOM: Insulin: 43.1 u[IU]/mL — ABNORMAL HIGH (ref 2.0–19.6)

## 2016-10-16 LAB — VITAMIN D 25 HYDROXY (VIT D DEFICIENCY, FRACTURES): Vit D, 25-Hydroxy: 55 ng/mL (ref 30–100)

## 2016-10-17 ENCOUNTER — Other Ambulatory Visit: Payer: Self-pay | Admitting: Internal Medicine

## 2016-10-17 DIAGNOSIS — E119 Type 2 diabetes mellitus without complications: Secondary | ICD-10-CM

## 2016-12-06 ENCOUNTER — Other Ambulatory Visit: Payer: Self-pay | Admitting: Internal Medicine

## 2016-12-13 ENCOUNTER — Other Ambulatory Visit: Payer: Self-pay | Admitting: Internal Medicine

## 2016-12-16 ENCOUNTER — Encounter: Payer: Self-pay | Admitting: *Deleted

## 2016-12-29 ENCOUNTER — Other Ambulatory Visit: Payer: Self-pay | Admitting: Internal Medicine

## 2017-01-21 ENCOUNTER — Ambulatory Visit: Payer: Self-pay | Admitting: Internal Medicine

## 2017-03-02 ENCOUNTER — Other Ambulatory Visit: Payer: Self-pay | Admitting: Internal Medicine

## 2017-03-02 ENCOUNTER — Other Ambulatory Visit: Payer: Self-pay | Admitting: Physician Assistant

## 2017-03-02 ENCOUNTER — Encounter: Payer: Self-pay | Admitting: Internal Medicine

## 2017-03-02 MED ORDER — ATENOLOL 100 MG PO TABS: 100.0000 mg | ORAL_TABLET | Freq: Every day | ORAL | 1 refills | Status: DC

## 2017-03-02 MED ORDER — GLYBURIDE 5 MG PO TABS: ORAL_TABLET | ORAL | 1 refills | Status: DC

## 2017-03-02 MED ORDER — GLYBURIDE 5 MG PO TABS
ORAL_TABLET | ORAL | 1 refills | Status: DC
Start: 2017-03-02 — End: 2017-03-02

## 2017-04-01 ENCOUNTER — Encounter: Payer: Self-pay | Admitting: Internal Medicine

## 2017-04-01 ENCOUNTER — Other Ambulatory Visit: Payer: Self-pay | Admitting: Internal Medicine

## 2017-04-01 DIAGNOSIS — E119 Type 2 diabetes mellitus without complications: Secondary | ICD-10-CM

## 2017-04-01 MED ORDER — SITAGLIPTIN PHOSPHATE 100 MG PO TABS: ORAL_TABLET | ORAL | 1 refills | Status: DC

## 2017-04-10 ENCOUNTER — Other Ambulatory Visit: Payer: Self-pay | Admitting: Internal Medicine

## 2017-04-10 DIAGNOSIS — E119 Type 2 diabetes mellitus without complications: Secondary | ICD-10-CM

## 2017-05-13 ENCOUNTER — Encounter: Payer: Self-pay | Admitting: Internal Medicine

## 2017-05-13 ENCOUNTER — Ambulatory Visit (INDEPENDENT_AMBULATORY_CARE_PROVIDER_SITE_OTHER): Payer: BLUE CROSS/BLUE SHIELD | Admitting: Internal Medicine

## 2017-05-13 VITALS — BP 138/86 | HR 72 | Temp 97.3°F | Resp 18 | Ht 70.5 in | Wt 237.8 lb

## 2017-05-13 DIAGNOSIS — Z0000 Encounter for general adult medical examination without abnormal findings: Secondary | ICD-10-CM

## 2017-05-13 DIAGNOSIS — Z1212 Encounter for screening for malignant neoplasm of rectum: Secondary | ICD-10-CM

## 2017-05-13 DIAGNOSIS — Z1211 Encounter for screening for malignant neoplasm of colon: Secondary | ICD-10-CM

## 2017-05-13 DIAGNOSIS — Z136 Encounter for screening for cardiovascular disorders: Secondary | ICD-10-CM | POA: Diagnosis not present

## 2017-05-13 DIAGNOSIS — I10 Essential (primary) hypertension: Secondary | ICD-10-CM

## 2017-05-13 DIAGNOSIS — Z79899 Other long term (current) drug therapy: Secondary | ICD-10-CM | POA: Diagnosis not present

## 2017-05-13 DIAGNOSIS — E559 Vitamin D deficiency, unspecified: Secondary | ICD-10-CM

## 2017-05-13 DIAGNOSIS — E291 Testicular hypofunction: Secondary | ICD-10-CM

## 2017-05-13 DIAGNOSIS — E349 Endocrine disorder, unspecified: Secondary | ICD-10-CM

## 2017-05-13 DIAGNOSIS — Z111 Encounter for screening for respiratory tuberculosis: Secondary | ICD-10-CM

## 2017-05-13 DIAGNOSIS — Z0001 Encounter for general adult medical examination with abnormal findings: Secondary | ICD-10-CM

## 2017-05-13 DIAGNOSIS — M1000 Idiopathic gout, unspecified site: Secondary | ICD-10-CM

## 2017-05-13 DIAGNOSIS — E782 Mixed hyperlipidemia: Secondary | ICD-10-CM

## 2017-05-13 DIAGNOSIS — E119 Type 2 diabetes mellitus without complications: Secondary | ICD-10-CM

## 2017-05-13 DIAGNOSIS — R5383 Other fatigue: Secondary | ICD-10-CM

## 2017-05-13 DIAGNOSIS — Z125 Encounter for screening for malignant neoplasm of prostate: Secondary | ICD-10-CM

## 2017-05-13 NOTE — Patient Instructions (Signed)

## 2017-05-13 NOTE — Progress Notes (Signed)
[facility]   Shaun [name], M.D.     Shaun [name]. Shaun [name], P.A.-C Shaun Gaudier, DNP [facility]                [address]                [place], [place]. [address] Telephone [phone] Telefax [phone] Annual  Screening/Preventative Visit  & Comprehensive Evaluation & Examination     This very nice 51 y.o. [name] presents for a Screening/Preventative Visit & comprehensive evaluation and management of multiple medical co-morbidities.  Patient has been followed for HTN, T2_NIDDM  , Hyperlipidemia and Vitamin D Deficiency. Patient also relates hx of Gout quiescent over the last year despite off meds.      HTN predates since 1999. Patient's BP has been controlled at home.  Today's BP initially elevated was normal at goal on recheck - 138/86. Patient denies any cardiac symptoms as chest pain, palpitations, shortness of breath, dizziness or ankle swelling.     Patient is on Testosterone replacement with improved stamina Patient's hyperlipidemia is controlled with diet and medications. Patient denies myalgias or other medication SE's. Last lipids were at goal albeit elevated Trig's: Lab Results  Component Value Date   CHOL 156 10/15/2016   HDL 22 (L) 10/15/2016   LDLCALC 65 10/15/2016   TRIG 343 (H) 10/15/2016   CHOLHDL 7.1 (H) 10/15/2016      Patient has Morbid Obesity (BMI 33+) and consequent T2_NIDDM since 2004 and patient denies reactive hypoglycemic symptoms, visual blurring, diabetic polys or paresthesias. Last A1c was not at goal: Lab Results  Component Value Date   HGBA1C 9.0 (H) 10/15/2016       Finally, patient has history of Vitamin D Deficiency ("15" in 2009) and last vitamin D was not at goal (70-100):  Lab Results  Component Value Date   VD25OH 55 10/15/2016   Current Outpatient Prescriptions on File Prior to Visit  Medication Sig  . ANDROGEL PUMP 20.25 MG/ACT (1.62%) GEL APPLY 2 PUMPS ON EACH ARM  (4 TOTAL PUMPS) DAILY  . aspirin 81 MG tablet Take 81 mg by mouth daily.  Marland Kitchen atenolol (TENORMIN) 100 MG tablet Take 1 tablet (100 mg total) by mouth daily.  . Cholecalciferol (VITAMIN D) 2000 units CAPS Take 4 capsules by mouth daily.   Marland Kitchen glyBURIDE (DIABETA) 5 MG tablet Take 1 tablet 3 x / day with meals for Diabetes  . JANUVIA 100 MG tablet TAKE 1 TABLET (100 MG TOTAL) BY MOUTH DAILY.  . metFORMIN (GLUCOPHAGE-XR) 500 MG 24 hr tablet TAKE 2 TABLETS BY MOUTH TWICE DAILY WITH FOOD  . mometasone (NASONEX) 50 MCG/ACT nasal spray Place 2 sprays into the nose daily.   No current facility-administered medications on file prior to visit.    Allergies  Allergen Reactions  . Zocor [Simvastatin]     Headache  . Zoloft [Sertraline Hcl]     dysphona   Past Medical History:  Diagnosis Date  . Asthma   . Diabetes mellitus without complication (HCC)   . Fatty liver disease, nonalcoholic   . GERD (gastroesophageal reflux disease)   . Gout   . Hyperlipidemia   . Hypertension   . Hypogonadism male   . Obesity   . Venous insufficiency    Health Maintenance  Topic Date Due  . OPHTHALMOLOGY EXAM  06/27/1975  . COLONOSCOPY  06/27/2015  . INFLUENZA VACCINE  03/04/2017  . FOOT EXAM  03/27/2017  .  URINE MICROALBUMIN  03/27/2017  . HEMOGLOBIN A1C  04/17/2017  . PNEUMOCOCCAL POLYSACCHARIDE VACCINE (2) 02/09/2018  . TETANUS/TDAP  02/10/2023  . HIV Screening  Completed   Immunization History  Administered Date(s) Administered  . PPD Test 02/17/2014, 03/08/2015, 03/27/2016, 05/13/2017  . Pneumococcal-Unspecified 02/09/2013  . Tdap 02/09/2013   Past Surgical History:  Procedure Laterality Date  . ANTERIOR CRUCIATE LIGAMENT REPAIR Left 2001  . CYSTOSCOPY  1993  . ESOPHAGOGASTRODUODENOSCOPY  1995, 2002   gastritis, esophagititis  . KNEE ARTHROSCOPY Right 2002, 1995   Family History  Problem Relation Age of Onset  . Diabetes Mother   . Hypertension Mother   . Gout Mother   . COPD Father    . Cancer Sister 48       appendix  . Gout Brother   . Hyperlipidemia Brother   . Hypertension Brother   . Hypertension Brother   . Hyperlipidemia Brother   . Gout Brother   . Diabetes Brother   . COPD Sister    Social History   Social History  . Marital status: Married    Spouse name: N/A  . Number of children: N/A  . Years of education: N/A   Occupational History  . Not on file.   Social History Main Topics  . Smoking status: Former Smoker    Packs/day: 1.00    Years: 15.00    Types: Cigarettes    Quit date: 08/07/2000  . Smokeless tobacco: Never Used  . Alcohol use Yes     Comment: social  . Drug use: No  . Sexual activity: Not on file    ROS Constitutional: Denies fever, chills, weight loss/gain, headaches, insomnia,  night sweats or change in appetite. Does c/o fatigue. Eyes: Denies redness, blurred vision, diplopia, discharge, itchy or watery eyes.  ENT: Denies discharge, congestion, post nasal drip, epistaxis, sore throat, earache, hearing loss, dental pain, Tinnitus, Vertigo, Sinus pain or snoring.  Cardio: Denies chest pain, palpitations, irregular heartbeat, syncope, dyspnea, diaphoresis, orthopnea, PND, claudication or edema Respiratory: denies cough, dyspnea, DOE, pleurisy, hoarseness, laryngitis or wheezing.  Gastrointestinal: Denies dysphagia, heartburn, reflux, water brash, pain, cramps, nausea, vomiting, bloating, diarrhea, constipation, hematemesis, melena, hematochezia, jaundice or hemorrhoids Genitourinary: Denies dysuria, frequency, urgency, nocturia, hesitancy, discharge, hematuria or flank pain Musculoskeletal: Denies arthralgia, myalgia, stiffness, Jt. Swelling, pain, limp or strain/sprain. Denies Falls. Skin: Denies puritis, rash, hives, warts, acne, eczema or change in skin lesion Neuro: No weakness, tremor, incoordination, spasms, paresthesia or pain Psychiatric: Denies confusion, memory loss or sensory loss. Denies Depression. Endocrine: Denies  change in weight, skin, hair change, nocturia, and paresthesia, diabetic polys, visual blurring or hyper / hypo glycemic episodes.  Heme/Lymph: No excessive bleeding, bruising or enlarged lymph nodes.  Physical Exam  BP 138/86   Pulse 72   Temp (!) 97.3 F (36.3 C)   Resp 18   Ht 5' 10.5" (1.791 m)   Wt 237 lb 12.8 oz (107.9 kg)   BMI 33.64 kg/m   General Appearance: Well nourished and well groomed and in no apparent distress.  Eyes: PERRLA, EOMs, conjunctiva no swelling or erythema, normal fundi and vessels. Sinuses: No frontal/maxillary tenderness ENT/Mouth: EACs patent / TMs  nl. Nares clear without erythema, swelling, mucoid exudates. Oral hygiene is good. No erythema, swelling, or exudate. Tongue normal, non-obstructing. Tonsils not swollen or erythematous. Hearing normal.  Neck: Supple, thyroid normal. No bruits, nodes or JVD. Respiratory: Respiratory effort normal.  BS equal and clear bilateral without rales, rhonci, wheezing or stridor.  Cardio: Heart sounds are normal with regular rate and rhythm and no murmurs, rubs or gallops. Peripheral pulses are normal and equal bilaterally without edema. No aortic or femoral bruits. Chest: symmetric with normal excursions and percussion.  Abdomen: Soft, with Nl bowel sounds. Nontender, no guarding, rebound, hernias, masses, or organomegaly.  Lymphatics: Non tender without lymphadenopathy.  Genitourinary: No hernias.Testes nl. DRE - prostate nl for age - smooth & firm w/o nodules. Musculoskeletal: Full ROM all peripheral extremities, joint stability, 5/5 strength, and normal gait. Skin: Warm and dry without rashes, lesions, cyanosis, clubbing or  ecchymosis.  Neuro: Cranial nerves intact, reflexes equal bilaterally. Normal muscle tone, no cerebellar symptoms. Sensation intact to touch, vibratory and Monofilament to the toes bilaterally. Pysch: Alert and oriented X 3 with normal affect, insight and judgment appropriate.   Assessment and  Plan  1. Annual Preventative/Screening Exam   2. Essential hypertension  - EKG 12-Lead - [place], RETROPERITNL ABD,  LTD - Urinalysis, Routine w reflex microscopic - Microalbumin / creatinine urine ratio - CBC with Differential/Platelet - BASIC METABOLIC PANEL WITH GFR - Magnesium - TSH  3. Hyperlipidemia, mixed  - EKG 12-Lead - [place], RETROPERITNL ABD,  LTD - Hepatic function panel - Lipid panel - TSH  4. Diabetes mellitus without complication (HCC)  - EKG 12-Lead - [place], RETROPERITNL ABD,  LTD - Urinalysis, Routine w reflex microscopic - Microalbumin / creatinine urine ratio - HM DIABETES FOOT EXAM - LOW EXTREMITY NEUR EXAM DOCUM - Hemoglobin A1c - Insulin, random  5. Vitamin D deficiency  - VITAMIN D 25 Hydroxy   6. Idiopathic gout  - Uric acid  7. Testosterone deficiency  - Testosterone  8. Screening for colorectal cancer  - POC Hemoccult Bld/Stl  9. Prostate cancer screening  - PSA  10. Screening for ischemic heart disease  - EKG 12-Lead  11. Screening for AAA (aortic abdominal aneurysm)  - [place], RETROPERITNL ABD,  LTD  12. Fatigue, unspecified type  - Iron,Total/Total Iron Binding Cap - Vitamin B12 - Testosterone  13. Medication management  - Urinalysis, Routine w reflex microscopic - Microalbumin / creatinine urine ratio - CBC with Differential/Platelet - BASIC METABOLIC PANEL WITH GFR - Hepatic function panel - Magnesium - Lipid panel - TSH - Hemoglobin A1c - Insulin, random - VITAMIN D 25 Hydroxy - Uric acid  14. Screening examination for pulmonary tuberculosis  - PPD      Patient was counseled in prudent diet, weight control to achieve/maintain BMI less than 25, BP monitoring, regular exercise and medications as discussed.  Discussed med effects and SE's. Routine screening labs and tests as requested with regular follow-up as recommended. Over 40 minutes of exam, counseling, chart review and high complex critical decision making was  performed

## 2017-05-14 LAB — HEMOGLOBIN A1C
Hgb A1c MFr Bld: 10.1 % of total Hgb — ABNORMAL HIGH (ref <5.7)
Mean Plasma Glucose: 243 (calc)
eAG (mmol/L): 13.5 (calc)

## 2017-05-14 LAB — PSA: PSA: 0.1 ng/mL (ref <=4.0)

## 2017-05-14 LAB — URINALYSIS, ROUTINE W REFLEX MICROSCOPIC
Bilirubin Urine: NEGATIVE
Hgb urine dipstick: NEGATIVE
Ketones, ur: NEGATIVE
Leukocytes, UA: NEGATIVE
Nitrite: NEGATIVE
Protein, ur: NEGATIVE
Specific Gravity, Urine: 1.014 (ref 1.001–1.03)
pH: 5.5 (ref 5.0–8.0)

## 2017-05-14 LAB — BASIC METABOLIC PANEL WITH GFR
BUN/Creatinine Ratio: 20 (calc) (ref 6–22)
BUN: 14 mg/dL (ref 7–25)
CO2: 29 mmol/L (ref 20–32)
Calcium: 9.4 mg/dL (ref 8.6–10.3)
Chloride: 98 mmol/L (ref 98–110)
Creat: 0.69 mg/dL — ABNORMAL LOW (ref 0.70–1.33)
GFR, Est African American: 127 mL/min/{1.73_m2} (ref >=60)
GFR, Est Non African American: 110 mL/min/{1.73_m2} (ref >=60)
Glucose, Bld: 192 mg/dL — ABNORMAL HIGH (ref 65–99)
Potassium: 3.8 mmol/L (ref 3.5–5.3)
Sodium: 135 mmol/L (ref 135–146)

## 2017-05-14 LAB — CBC WITH DIFFERENTIAL/PLATELET
Basophils Absolute: 40 cells/uL (ref 0–200)
Basophils Relative: 0.6 %
Eosinophils Absolute: 194 cells/uL (ref 15–500)
Eosinophils Relative: 2.9 %
HCT: 49 % (ref 38.5–50.0)
Hemoglobin: 17.1 g/dL (ref 13.2–17.1)
Lymphs Abs: 3183 cells/uL (ref 850–3900)
MCH: 28.4 pg (ref 27.0–33.0)
MCHC: 34.9 g/dL (ref 32.0–36.0)
MCV: 81.4 fL (ref 80.0–100.0)
MPV: 9.9 fL (ref 7.5–12.5)
Monocytes Relative: 6.3 %
Neutro Abs: 2861 cells/uL (ref 1500–7800)
Neutrophils Relative %: 42.7 %
Platelets: 203 10*3/uL (ref 140–400)
RBC: 6.02 10*6/uL — ABNORMAL HIGH (ref 4.20–5.80)
RDW: 13.3 % (ref 11.0–15.0)
Total Lymphocyte: 47.5 %
WBC mixed population: 422 cells/uL (ref 200–950)
WBC: 6.7 10*3/uL (ref 3.8–10.8)

## 2017-05-14 LAB — LIPID PANEL
Cholesterol: 169 mg/dL (ref <200)
HDL: 22 mg/dL — ABNORMAL LOW (ref >40)
LDL Cholesterol (Calc): 95 mg/dL (calc)
Non-HDL Cholesterol (Calc): 147 mg/dL (calc) — ABNORMAL HIGH (ref <130)
Total CHOL/HDL Ratio: 7.7 (calc) — ABNORMAL HIGH (ref <5.0)
Triglycerides: 387 mg/dL — ABNORMAL HIGH (ref <150)

## 2017-05-14 LAB — IRON, TOTAL/TOTAL IRON BINDING CAP
%SAT: 26 % (calc) (ref 15–60)
Iron: 101 ug/dL (ref 50–180)
TIBC: 384 mcg/dL (calc) (ref 250–425)

## 2017-05-14 LAB — MICROALBUMIN / CREATININE URINE RATIO
Creatinine, Urine: 67 mg/dL (ref 20–320)
Microalb Creat Ratio: 10 mcg/mg creat (ref <30)
Microalb, Ur: 0.7 mg/dL

## 2017-05-14 LAB — HEPATIC FUNCTION PANEL
AG Ratio: 1.5 (calc) (ref 1.0–2.5)
ALT: 46 U/L (ref 9–46)
AST: 37 U/L — ABNORMAL HIGH (ref 10–35)
Albumin: 4.2 g/dL (ref 3.6–5.1)
Alkaline phosphatase (APISO): 58 U/L (ref 40–115)
Bilirubin, Direct: 0.1 mg/dL (ref 0.0–0.2)
Globulin: 2.8 g/dL (calc) (ref 1.9–3.7)
Indirect Bilirubin: 0.5 mg/dL (calc) (ref 0.2–1.2)
Total Bilirubin: 0.6 mg/dL (ref 0.2–1.2)
Total Protein: 7 g/dL (ref 6.1–8.1)

## 2017-05-14 LAB — TESTOSTERONE: Testosterone: 195 ng/dL — ABNORMAL LOW (ref 250–827)

## 2017-05-14 LAB — INSULIN, RANDOM: Insulin: 10.2 u[IU]/mL (ref 2.0–19.6)

## 2017-05-14 LAB — TSH: TSH: 2.65 mIU/L (ref 0.40–4.50)

## 2017-05-14 LAB — MAGNESIUM: Magnesium: 1.8 mg/dL (ref 1.5–2.5)

## 2017-05-14 LAB — VITAMIN B12: Vitamin B-12: 312 pg/mL (ref 200–1100)

## 2017-05-14 LAB — URIC ACID: Uric Acid, Serum: 6.7 mg/dL (ref 4.0–8.0)

## 2017-05-14 LAB — VITAMIN D 25 HYDROXY (VIT D DEFICIENCY, FRACTURES): Vit D, 25-Hydroxy: 80 ng/mL (ref 30–100)

## 2017-06-08 ENCOUNTER — Other Ambulatory Visit: Payer: Self-pay | Admitting: *Deleted

## 2017-06-08 ENCOUNTER — Encounter (INDEPENDENT_AMBULATORY_CARE_PROVIDER_SITE_OTHER): Payer: Self-pay

## 2017-06-08 MED ORDER — METFORMIN HCL ER 500 MG PO TB24: ORAL_TABLET | ORAL | 1 refills | Status: DC

## 2017-08-19 ENCOUNTER — Ambulatory Visit: Payer: Self-pay | Admitting: Adult Health

## 2017-08-23 ENCOUNTER — Other Ambulatory Visit: Payer: Self-pay | Admitting: Internal Medicine

## 2017-08-23 ENCOUNTER — Other Ambulatory Visit: Payer: Self-pay | Admitting: Physician Assistant

## 2017-08-30 NOTE — Progress Notes (Signed)
FOLLOW UP  Assessment and Plan:   Hypertension Well controlled with current medications  Monitor blood pressure at home; patient to call if consistently greater than 130/80 Continue DASH diet.   Reminder to go to the ER if any CP, SOB, nausea, dizziness, severe HA, changes vision/speech, left arm numbness and tingling and jaw pain.  Cholesterol Currently at LDL goal without medication; triglycerides remain elevated Discussed benefit of statin in poorly controlled diabetic Continue low cholesterol diet and exercise.  Check lipid panel.   Poorly controlled T2 Diabetes without complications Continue medication: metformin 500 mg 2 tabs BID, januvia 100 mg daily, glyburide 5 mg TID, cinnamon 1000 mg BID Has failed several other agents Discussed continued weight loss, carb quality and portion control Discussed insulin vs endocrinology should A1Cs continue to not improve Continue diet and exercise.  Perform daily foot/skin check, notify office of any concerning changes.  Check A1C  Obesity with co morbidities Long discussion about weight loss, diet, and exercise Recommended diet heavy in fruits and veggies and low in animal meats, cheeses, and dairy products, appropriate calorie intake Discussed ideal weight for height and initial weight goal (229 lb) Patient will work on portion control for carbs, increase vegetables Will follow up in 3 months  Vitamin D Def At goal at last visit; continue supplementation to maintain goal of 70-100 Defer Vit D level  Continue diet and meds as discussed. Further disposition pending results of labs. Discussed med's effects and SE's.   Over 30 minutes of exam, counseling, chart review, and critical decision making was performed.   Future Appointments  Date Time Provider Department Center  08/31/2017  4:00 PM Judd Gaudier, NP [facility] None  11/18/2017  4:30 PM [name], MD [facility] None  06/21/2018  3:00 PM [name], MD  [facility] None    ----------------------------------------------------------------------------------------------------------------------  HPI 52 y.o. male  presents for 3 month follow up on hypertension, cholesterol, T2 diabetes, obesity and vitamin D deficiency.   Poorly controlled diabetic - was recommended to start cinnamon 1000 mg BID at last visit  BMI is Body mass index is 33.1 kg/m., he has been working on diet and exercise. Wt Readings from Last 3 Encounters:  08/31/17 234 lb (106.1 kg)  05/13/17 237 lb 12.8 oz (107.9 kg)  10/15/16 234 lb 6.4 oz (106.3 kg)   His blood pressure has been controlled at home, today their BP is BP: 118/76  He does workout sporatically. He denies chest pain, shortness of breath, dizziness.   He is not on cholesterol medication and denies myalgias. His cholesterol is not at goal. The cholesterol last visit was:   Lab Results  Component Value Date   CHOL 169 05/13/2017   HDL 22 (L) 05/13/2017   LDLCALC 65 10/15/2016   TRIG 387 (H) 05/13/2017   CHOLHDL 7.7 (H) 05/13/2017    He has been working on diet and exercise for T2 diabetes, and denies hypoglycemia , increased appetite, nausea, paresthesia of the feet, polydipsia, polyuria, visual disturbances, vomiting and weight loss. He does sporadically check fasting blood sugars and range from 120-280. He reports hypoglycemic awareness with glucose of 100. He was Last A1C in the office was:  Lab Results  Component Value Date   HGBA1C 10.1 (H) 05/13/2017   Patient is on Vitamin D supplement and at goal at recent check:    Lab Results  Component Value Date   VD25OH 80 05/13/2017        Current Medications:  Current Outpatient Medications on  File Prior to Visit  Medication Sig  . ANDROGEL PUMP 20.25 MG/ACT (1.62%) GEL APPLY 2 PUMPS ON EACH ARM (4 TOTAL PUMPS) DAILY  . aspirin 81 MG tablet Take 81 mg by mouth daily.  Marland Kitchen. atenolol (TENORMIN) 100 MG tablet TAKE 1 TABLET(100 MG) BY MOUTH DAILY  .  Cholecalciferol (VITAMIN D) 2000 units CAPS Take 4 capsules by mouth daily.   Marland Kitchen. glyBURIDE (DIABETA) 5 MG tablet TAKE 1 TABLET BY MOUTH THREE TIMES DAILY WITH MEALS FOR DIABETES  . JANUVIA 100 MG tablet TAKE 1 TABLET (100 MG TOTAL) BY MOUTH DAILY.  . metFORMIN (GLUCOPHAGE-XR) 500 MG 24 hr tablet TAKE 2 TABLETS BY MOUTH TWICE DAILY WITH FOOD  . mometasone (NASONEX) 50 MCG/ACT nasal spray Place 2 sprays into the nose daily.   No current facility-administered medications on file prior to visit.      Allergies:  Allergies  Allergen Reactions  . Zocor [Simvastatin]     Headache  . Zoloft [Sertraline Hcl]     dysphona     Medical History:  Past Medical History:  Diagnosis Date  . Asthma   . Diabetes mellitus without complication (HCC)   . Fatty liver disease, nonalcoholic   . GERD (gastroesophageal reflux disease)   . Gout   . Hyperlipidemia   . Hypertension   . Hypogonadism male   . Obesity   . Venous insufficiency    Family history- Reviewed and unchanged Social history- Reviewed and unchanged   Review of Systems:  Review of Systems  Constitutional: Negative for malaise/fatigue and weight loss.  HENT: Negative for hearing loss and tinnitus.   Eyes: Negative for blurred vision and double vision.  Respiratory: Negative for cough, shortness of breath and wheezing.   Cardiovascular: Negative for chest pain, palpitations, orthopnea, claudication and leg swelling.  Gastrointestinal: Negative for abdominal pain, blood in stool, constipation, diarrhea, heartburn, melena, nausea and vomiting.  Genitourinary: Negative.   Musculoskeletal: Negative for joint pain and myalgias.  Skin: Negative for rash.  Neurological: Negative for dizziness, tingling, sensory change, weakness and headaches.  Endo/Heme/Allergies: Negative for polydipsia.  Psychiatric/Behavioral: Negative.   All other systems reviewed and are negative.   Physical Exam: BP 118/76   Pulse 74   Temp (!) 97.3 F  (36.3 C)   Wt 234 lb (106.1 kg)   SpO2 99%   BMI 33.10 kg/m  Wt Readings from Last 3 Encounters:  08/31/17 234 lb (106.1 kg)  05/13/17 237 lb 12.8 oz (107.9 kg)  10/15/16 234 lb 6.4 oz (106.3 kg)   General Appearance: Well nourished, in no apparent distress. Eyes: PERRLA, EOMs, conjunctiva no swelling or erythema Sinuses: No Frontal/maxillary tenderness ENT/Mouth: Ext aud canals clear, TMs without erythema, bulging. No erythema, swelling, or exudate on post pharynx.  Tonsils not swollen or erythematous. Hearing normal.  Neck: Supple, thyroid normal.  Respiratory: Respiratory effort normal, BS equal bilaterally without rales, rhonchi, wheezing or stridor.  Cardio: RRR with no MRGs. Brisk peripheral pulses without edema.  Abdomen: Soft, + BS.  Non tender, no guarding, rebound, hernias, masses. Lymphatics: Non tender without lymphadenopathy.  Musculoskeletal: Full ROM, 5/5 strength, Normal gait Skin: Warm, dry without rashes, lesions, ecchymosis.  Neuro: Cranial nerves intact. No cerebellar symptoms.  Psych: Awake and oriented X 3, normal affect, Insight and Judgment appropriate.    Dan MakerAshley C Catalyna Reilly, NP 3:58 PM [facility]

## 2017-08-31 ENCOUNTER — Encounter: Payer: Self-pay | Admitting: Adult Health

## 2017-08-31 ENCOUNTER — Ambulatory Visit: Payer: BLUE CROSS/BLUE SHIELD | Admitting: Adult Health

## 2017-08-31 VITALS — BP 118/76 | HR 74 | Temp 97.3°F | Wt 234.0 lb

## 2017-08-31 DIAGNOSIS — I10 Essential (primary) hypertension: Secondary | ICD-10-CM

## 2017-08-31 DIAGNOSIS — Z79899 Other long term (current) drug therapy: Secondary | ICD-10-CM

## 2017-08-31 DIAGNOSIS — E1165 Type 2 diabetes mellitus with hyperglycemia: Secondary | ICD-10-CM | POA: Diagnosis not present

## 2017-08-31 DIAGNOSIS — E782 Mixed hyperlipidemia: Secondary | ICD-10-CM

## 2017-08-31 DIAGNOSIS — E559 Vitamin D deficiency, unspecified: Secondary | ICD-10-CM

## 2017-08-31 MED ORDER — GLUCOSE BLOOD VI STRP: ORAL_STRIP | 0 refills | Status: DC

## 2017-08-31 NOTE — Patient Instructions (Addendum)
Weight goal for next visit: 229 lb  Start checking sugars a bit more frequently - check fasting (before eating any food for the day)  And a rotating post prandial (2 hours after a meal) and keep a log to bring next time.      Consider the chart below - carbs or starch are not required, so if your meal doesn't need, can skip and do more protein or a healthy fat instead -   Aim for whole grain or other healthy starch as much as possible  Reserve white starches and carbs for special occasions - I like to pick 1 day a week to "splurge" in moderation -         When it comes to diets, agreement about the perfect plan isn't easy to find, even among the experts. Experts at the [facility] [employer] developed an idea known as the Healthy Eating Plate. Just imagine a plate divided into logical, healthy portions.  The emphasis is on diet quality:  Load up on vegetables and fruits - one-half of your plate: Aim for color and variety, and remember that potatoes don't count.  Go for whole grains - one-quarter of your plate: Whole wheat, barley, wheat berries, quinoa, oats, brown rice, and foods made with them. If you want pasta, go with whole wheat pasta.  Protein power - one-quarter of your plate: Fish, chicken, beans, and nuts are all healthy, versatile protein sources. Limit red meat.  The diet, however, does go beyond the plate, offering a few other suggestions.  Use healthy plant oils, such as olive, canola, soy, corn, sunflower and peanut. Check the labels, and avoid partially hydrogenated oil, which have unhealthy trans fats.  If you're thirsty, drink water. Coffee and tea are good in moderation, but skip sugary drinks and limit milk and dairy products to one or two daily servings.  The type of carbohydrate in the diet is more important than the amount. Some sources of carbohydrates, such as vegetables, fruits, whole grains, and beans-are healthier than others.  Finally, stay  active.   Targets for Glucose Readings: Time of Check Usual Target for Most People  Before Meals  70-130  Two hours after meals  Less than 180  Bedtime  90-150   Why Should You Check Your Blood Glucose? -The A1C tells you how your diabetes is doing over a 3 month period.  Home blood glucose monitoring (or checking) gives you information about your diabetes on a daily basis.  You will learn how well your diabetes care plan is working and whether your blood glucose is in your target range throughout the day.   -Reviewing daily blood glucose levels will help you and your healthcare team make any needed changes to you meal plan, physical activity and medications.  Meter Supplies: - A glucose meter was provided at your visit today along with strips and lancets. The blood glucose meter strips and lancets are usually covered by health insurance. Please let us know if they are not and contact your insurance to see which meter they prefer.  How to get a good blood sample: -Wash your hands in warm water.  You do not need to use alcohol wipes. -Massage your hands. -Choose which finger you will use.  It helps to use a different finger each time to avoid soreness. -Keep you hand below your wrist when using you lancet to "prick" your finger. -Apply gentle pressure, but do NOT squeeze your finger.  Checking your blood  glucose Important Reminders: -Make sure your strips are not expired.  Check the date on the bottle. -Make sure the code on bottle matches the code on your machine. -Make sure your hands are clean and dry. -Do not use the center of your finger, it is the most sensitive area.  Use a spot to the side of the center of your fingertip. -Completely fill the strip target area with blood (until it beeps) to make sure the results are accurate. -You will need a prescription to have your glucose strips and lancets covered by insurance. -There is usually an 800 number on the back of your meter for  help with meter issues.  How often to check: -If you take diabetes pills or take one injection of insulin each day, you will usually be asked to check twice a day, before breakfast and 2 hours after one meal, or as directed. -If you take several insulin injections each day, you will usually be asked to check four times a day before meals and at bedtime every day.

## 2017-09-01 ENCOUNTER — Other Ambulatory Visit: Payer: Self-pay | Admitting: Adult Health

## 2017-09-01 ENCOUNTER — Encounter: Payer: Self-pay | Admitting: Adult Health

## 2017-09-01 DIAGNOSIS — E1165 Type 2 diabetes mellitus with hyperglycemia: Secondary | ICD-10-CM

## 2017-09-01 DIAGNOSIS — D751 Secondary polycythemia: Secondary | ICD-10-CM | POA: Insufficient documentation

## 2017-09-01 LAB — HEPATIC FUNCTION PANEL
AG Ratio: 1.5 (calc) (ref 1.0–2.5)
ALT: 44 U/L (ref 9–46)
AST: 21 U/L (ref 10–35)
Albumin: 4.4 g/dL (ref 3.6–5.1)
Alkaline phosphatase (APISO): 61 U/L (ref 40–115)
Bilirubin, Direct: 0.1 mg/dL (ref 0.0–0.2)
Globulin: 3 g/dL (calc) (ref 1.9–3.7)
Indirect Bilirubin: 0.5 mg/dL (calc) (ref 0.2–1.2)
Total Bilirubin: 0.6 mg/dL (ref 0.2–1.2)
Total Protein: 7.4 g/dL (ref 6.1–8.1)

## 2017-09-01 LAB — BASIC METABOLIC PANEL WITH GFR
BUN/Creatinine Ratio: 24 (calc) — ABNORMAL HIGH (ref 6–22)
BUN: 16 mg/dL (ref 7–25)
CO2: 28 mmol/L (ref 20–32)
Calcium: 9.9 mg/dL (ref 8.6–10.3)
Chloride: 98 mmol/L (ref 98–110)
Creat: 0.67 mg/dL — ABNORMAL LOW (ref 0.70–1.33)
GFR, Est African American: 128 mL/min/{1.73_m2} (ref >=60)
GFR, Est Non African American: 110 mL/min/{1.73_m2} (ref >=60)
Glucose, Bld: 171 mg/dL — ABNORMAL HIGH (ref 65–99)
Potassium: 3.8 mmol/L (ref 3.5–5.3)
Sodium: 138 mmol/L (ref 135–146)

## 2017-09-01 LAB — CBC WITH DIFFERENTIAL/PLATELET
Basophils Absolute: 52 cells/uL (ref 0–200)
Basophils Relative: 0.6 %
Eosinophils Absolute: 516 cells/uL — ABNORMAL HIGH (ref 15–500)
Eosinophils Relative: 6 %
HCT: 51.8 % — ABNORMAL HIGH (ref 38.5–50.0)
Hemoglobin: 17.7 g/dL — ABNORMAL HIGH (ref 13.2–17.1)
Lymphs Abs: 3896 cells/uL (ref 850–3900)
MCH: 28.1 pg (ref 27.0–33.0)
MCHC: 34.2 g/dL (ref 32.0–36.0)
MCV: 82.2 fL (ref 80.0–100.0)
MPV: 10.5 fL (ref 7.5–12.5)
Monocytes Relative: 5.2 %
Neutro Abs: 3689 cells/uL (ref 1500–7800)
Neutrophils Relative %: 42.9 %
Platelets: 241 10*3/uL (ref 140–400)
RBC: 6.3 10*6/uL — ABNORMAL HIGH (ref 4.20–5.80)
RDW: 13.9 % (ref 11.0–15.0)
Total Lymphocyte: 45.3 %
WBC mixed population: 447 cells/uL (ref 200–950)
WBC: 8.6 10*3/uL (ref 3.8–10.8)

## 2017-09-01 LAB — LIPID PANEL
Cholesterol: 162 mg/dL (ref <200)
HDL: 24 mg/dL — ABNORMAL LOW (ref >40)
Non-HDL Cholesterol (Calc): 138 mg/dL (calc) — ABNORMAL HIGH (ref <130)
Total CHOL/HDL Ratio: 6.8 (calc) — ABNORMAL HIGH (ref <5.0)
Triglycerides: 416 mg/dL — ABNORMAL HIGH (ref <150)

## 2017-09-01 LAB — HEMOGLOBIN A1C
Hgb A1c MFr Bld: 10.8 % of total Hgb — ABNORMAL HIGH (ref <5.7)
Mean Plasma Glucose: 263 (calc)
eAG (mmol/L): 14.6 (calc)

## 2017-09-01 LAB — TSH: TSH: 2.38 mIU/L (ref 0.40–4.50)

## 2017-09-01 MED ORDER — FENOFIBRATE 160 MG PO TABS: 160.0000 mg | ORAL_TABLET | Freq: Every day | ORAL | 1 refills | Status: DC

## 2017-09-08 ENCOUNTER — Encounter: Payer: Self-pay | Admitting: Adult Health

## 2017-09-08 ENCOUNTER — Other Ambulatory Visit: Payer: Self-pay | Admitting: Adult Health

## 2017-09-08 DIAGNOSIS — J329 Chronic sinusitis, unspecified: Secondary | ICD-10-CM

## 2017-09-08 MED ORDER — AZITHROMYCIN 250 MG PO TABS: ORAL_TABLET | ORAL | 1 refills | Status: AC

## 2017-09-08 MED ORDER — PREDNISONE 20 MG PO TABS
ORAL_TABLET | ORAL | 0 refills | Status: DC
Start: 2017-09-08 — End: 2018-03-18

## 2017-09-09 ENCOUNTER — Other Ambulatory Visit: Payer: Self-pay

## 2017-09-09 DIAGNOSIS — E119 Type 2 diabetes mellitus without complications: Secondary | ICD-10-CM

## 2017-09-09 MED ORDER — CONTOUR NEXT MONITOR W/DEVICE KIT: 1.0000 | PACK | Freq: Three times a day (TID) | 0 refills | Status: DC

## 2017-09-09 MED ORDER — LANCETS MISC: 0 refills | Status: DC

## 2017-09-09 MED ORDER — GLUCOSE BLOOD VI STRP: ORAL_STRIP | 12 refills | Status: DC

## 2017-09-21 ENCOUNTER — Other Ambulatory Visit: Payer: Self-pay | Admitting: Internal Medicine

## 2017-09-21 DIAGNOSIS — E119 Type 2 diabetes mellitus without complications: Secondary | ICD-10-CM

## 2017-10-09 ENCOUNTER — Encounter: Payer: Self-pay | Admitting: Endocrinology

## 2017-10-15 ENCOUNTER — Other Ambulatory Visit: Payer: Self-pay | Admitting: Adult Health

## 2017-10-15 DIAGNOSIS — E119 Type 2 diabetes mellitus without complications: Secondary | ICD-10-CM

## 2017-11-18 ENCOUNTER — Ambulatory Visit: Payer: Self-pay | Admitting: Internal Medicine

## 2017-12-05 ENCOUNTER — Other Ambulatory Visit: Payer: Self-pay | Admitting: Internal Medicine

## 2017-12-21 ENCOUNTER — Other Ambulatory Visit: Payer: Self-pay | Admitting: Adult Health

## 2017-12-21 ENCOUNTER — Other Ambulatory Visit: Payer: Self-pay | Admitting: Internal Medicine

## 2017-12-21 DIAGNOSIS — E119 Type 2 diabetes mellitus without complications: Secondary | ICD-10-CM

## 2017-12-21 MED ORDER — METFORMIN HCL ER 500 MG PO TB24: ORAL_TABLET | ORAL | 0 refills | Status: DC

## 2017-12-21 MED ORDER — SITAGLIPTIN PHOSPHATE 100 MG PO TABS: ORAL_TABLET | ORAL | 0 refills | Status: DC

## 2018-03-17 ENCOUNTER — Encounter: Payer: Self-pay | Admitting: Internal Medicine

## 2018-03-17 ENCOUNTER — Other Ambulatory Visit: Payer: Self-pay | Admitting: Internal Medicine

## 2018-03-17 DIAGNOSIS — Z87891 Personal history of nicotine dependence: Secondary | ICD-10-CM | POA: Insufficient documentation

## 2018-03-17 DIAGNOSIS — E119 Type 2 diabetes mellitus without complications: Secondary | ICD-10-CM

## 2018-03-17 DIAGNOSIS — Z8249 Family history of ischemic heart disease and other diseases of the circulatory system: Secondary | ICD-10-CM | POA: Insufficient documentation

## 2018-03-17 NOTE — Progress Notes (Signed)
This very nice 52 y.o. [name] presents for 6 month follow up with HTN, HLD, T2_DM, Testosterone and Vitamin D Deficiency.      Patient is treated for HTN (1999)  & BP has been controlled at home. Today's BP is elevated at 142/88. Patient has had no complaints of any cardiac type chest pain, palpitations, dyspnea / orthopnea / PND, dizziness, claudication, or dependent edema.     Hyperlipidemia is controlled with diet & meds. Patient denies myalgias or other med SE's. Last Lipids were not at goal due to elevated Trig's: Lab Results  Component Value Date   CHOL 162 08/31/2017   HDL 24 (L) 08/31/2017   LDLCALC not calculated 08/31/2017   TRIG 416 (H) 08/31/2017   CHOLHDL 6.8 (H) 08/31/2017      Also, the patient has history of Morbid obesity (BMI 33+) and T2_NIDDM (2004) and has had no symptoms of reactive hypoglycemia, diabetic polys, paresthesias or visual blurring.  Patient admits compulsive over eating and last A1c was not at goal: Lab Results  Component Value Date   HGBA1C 11.0 (H) 03/18/2018      Patient is on Testosterone replacement and endorses increased stamina & sense of well being.      Further, the patient also has history of Vitamin D Deficiency ("15" in 2009) and supplements vitamin D without any suspected side-effects. Last vitamin D was at goal:  Lab Results  Component Value Date   VD25OH 67 03/18/2018   Current Outpatient Medications on File Prior to Visit  Medication Sig  . aspirin 81 MG tablet Take 81 mg by mouth daily.  Marland Kitchen atenolol (TENORMIN) 100 MG tablet TAKE 1 TABLET(100 MG) BY MOUTH DAILY  . Blood Glucose Monitoring Suppl (CONTOUR NEXT MONITOR) w/Device KIT 1 Device by Does not apply route 3 (three) times daily.  . Cholecalciferol (VITAMIN D) 2000 units CAPS Take 4 capsules by mouth daily.   . Cinnamon 500 MG capsule Take 500 mg by mouth 4 (four) times daily.  . fenofibrate 160 MG tablet Take 1 tablet (160 mg total) by mouth daily.  Marland Kitchen glucose blood (CONTOUR  NEXT TEST) test strip Test blood sugar up to three times a day  . glyBURIDE (DIABETA) 5 MG tablet TAKE 1 TABLET BY MOUTH THREE TIMES DAILY WITH MEALS FOR DIABETES  . MICROLET LANCETS MISC USE TO TEST UP TO THREE TIMES DAILY AS DIRECTED  . sitaGLIPtin (JANUVIA) 100 MG tablet TAKE 1 TABLET BY MOUTH DAILY FOR DIABETES  . ANDROGEL PUMP 20.25 MG/ACT (1.62%) GEL APPLY 2 PUMPS ON EACH ARM (4 TOTAL PUMPS) DAILY (Patient not taking: Reported on 03/18/2018)   No current facility-administered medications on file prior to visit.    Allergies  Allergen Reactions  . Zocor [Simvastatin]     Headache  . Zoloft [Sertraline Hcl]     dysphona   PMHx:   Past Medical History:  Diagnosis Date  . Asthma   . Diabetes mellitus without complication ([place])   . Fatty liver disease, nonalcoholic   . GERD (gastroesophageal reflux disease)   . Gout   . Hyperlipidemia   . Hypertension   . Hypogonadism male   . Obesity   . Venous insufficiency    Immunization History  Administered Date(s) Administered  . PPD Test 02/17/2014, 03/08/2015, 03/27/2016, 05/13/2017  . Pneumococcal-Unspecified 02/09/2013  . Tdap 02/09/2013   Past Surgical History:  Procedure Laterality Date  . ANTERIOR CRUCIATE LIGAMENT REPAIR Left 2001  . CYSTOSCOPY  1993  .  ESOPHAGOGASTRODUODENOSCOPY  1995, 2002   gastritis, esophagititis  . KNEE ARTHROSCOPY Right 2002, 1995   FHx:    Reviewed / unchanged  SHx:    Reviewed / unchanged   Systems Review:  Constitutional: Denies fever, chills, wt changes, headaches, insomnia, fatigue, night sweats, change in appetite. Eyes: Denies redness, blurred vision, diplopia, discharge, itchy, watery eyes.  ENT: Denies discharge, congestion, post nasal drip, epistaxis, sore throat, earache, hearing loss, dental pain, tinnitus, vertigo, sinus pain, snoring.  CV: Denies chest pain, palpitations, irregular heartbeat, syncope, dyspnea, diaphoresis, orthopnea, PND, claudication or edema. Respiratory:  denies cough, dyspnea, DOE, pleurisy, hoarseness, laryngitis, wheezing.  Gastrointestinal: Denies dysphagia, odynophagia, heartburn, reflux, water brash, abdominal pain or cramps, nausea, vomiting, bloating, diarrhea, constipation, hematemesis, melena, hematochezia  or hemorrhoids. Genitourinary: Denies dysuria, frequency, urgency, nocturia, hesitancy, discharge, hematuria or flank pain. Musculoskeletal: Denies arthralgias, myalgias, stiffness, jt. swelling, pain, limping or strain/sprain.  Skin: Denies pruritus, rash, hives, warts, acne, eczema or change in skin lesion(s). Neuro: No weakness, tremor, incoordination, spasms, paresthesia or pain. Psychiatric: Denies confusion, memory loss or sensory loss. Endo: Denies change in weight, skin or hair change.  Heme/Lymph: No excessive bleeding, bruising or enlarged lymph nodes.  Physical Exam  BP (!) 142/88   Pulse 78   Temp (!) 97.3 F (36.3 C)   Resp 18   Ht 5' 10.5" (1.791 m)   Wt 234 lb 6.4 oz (106.3 kg)   BMI 33.16 kg/m   Appears  over nourished, well groomed  and in no distress.  Eyes: PERRLA, EOMs, conjunctiva no swelling or erythema. Sinuses: No frontal/maxillary tenderness ENT/Mouth: EAC's clear, TM's nl w/o erythema, bulging. Nares clear w/o erythema, swelling, exudates. Oropharynx clear without erythema or exudates. Oral hygiene is good. Tongue normal, non obstructing. Hearing intact.  Neck: Supple. Thyroid not palpable. Car 2+/2+ without bruits, nodes or JVD. Chest: Respirations nl with BS clear & equal w/o rales, rhonchi, wheezing or stridor.  Cor: Heart sounds normal w/ regular rate and rhythm without sig. murmurs, gallops, clicks or rubs. Peripheral pulses normal and equal  without edema.  Abdomen: Soft & bowel sounds normal. Non-tender w/o guarding, rebound, hernias, masses or organomegaly.  Lymphatics: Unremarkable.  Musculoskeletal: Full ROM all peripheral extremities, joint stability, 5/5 strength and normal gait.    Skin: Warm, dry without exposed rashes, lesions or ecchymosis apparent.  Neuro: Cranial nerves intact, reflexes equal bilaterally. Sensory-motor testing grossly intact. Tendon reflexes grossly intact.  Pysch: Alert & oriented x 3.  Insight and judgement nl & appropriate. No ideations.  Assessment and Plan:  1. Essential hypertension  - Continue medication, monitor blood pressure at home.  - Continue DASH diet.  Reminder to go to the ER if any CP,  SOB, nausea, dizziness, severe HA, changes vision/speech.  - CBC with Differential/Platelet - COMPLETE METABOLIC PANEL WITH GFR - Magnesium - TSH  2. Hyperlipidemia, mixed - Continue diet/meds, exercise,& lifestyle modifications.  - Continue monitor periodic cholesterol/liver & renal functions   - Lipid panel - TSH  3. Diabetes mellitus without complication ([place])  - Continue diet, exercise, lifestyle modifications.  - Monitor appropriate labs.  - Hemoglobin A1c - Insulin, random  4. Vitamin D deficiency  - Continue supplementation.  - VITAMIN D 25 Hydroxyl  5. Testosterone deficiency  - Testosterone  6. Idiopathic gout  - Uric acid  7. Medication management  - CBC with Differential/Platelet - COMPLETE METABOLIC PANEL WITH GFR - Magnesium - Lipid panel - TSH - Hemoglobin A1c - Insulin, random -  VITAMIN D 25 Hydroxyl - Testosterone - Uric acid       Discussed  regular exercise, BP monitoring, weight control to achieve/maintain BMI less than 25 and discussed med and SE's. Recommended labs to assess and monitor clinical status with further disposition pending results of labs. Over 30 minutes of exam, counseling, chart review was performed.

## 2018-03-17 NOTE — Patient Instructions (Signed)

## 2018-03-18 ENCOUNTER — Ambulatory Visit (INDEPENDENT_AMBULATORY_CARE_PROVIDER_SITE_OTHER): Payer: BLUE CROSS/BLUE SHIELD | Admitting: Internal Medicine

## 2018-03-18 ENCOUNTER — Other Ambulatory Visit: Payer: Self-pay

## 2018-03-18 VITALS — BP 142/88 | HR 78 | Temp 97.3°F | Resp 18 | Ht 70.5 in | Wt 234.4 lb

## 2018-03-18 DIAGNOSIS — E119 Type 2 diabetes mellitus without complications: Secondary | ICD-10-CM | POA: Diagnosis not present

## 2018-03-18 DIAGNOSIS — E559 Vitamin D deficiency, unspecified: Secondary | ICD-10-CM

## 2018-03-18 DIAGNOSIS — E349 Endocrine disorder, unspecified: Secondary | ICD-10-CM

## 2018-03-18 DIAGNOSIS — Z79899 Other long term (current) drug therapy: Secondary | ICD-10-CM | POA: Diagnosis not present

## 2018-03-18 DIAGNOSIS — E782 Mixed hyperlipidemia: Secondary | ICD-10-CM

## 2018-03-18 DIAGNOSIS — I10 Essential (primary) hypertension: Secondary | ICD-10-CM | POA: Diagnosis not present

## 2018-03-18 DIAGNOSIS — M1000 Idiopathic gout, unspecified site: Secondary | ICD-10-CM

## 2018-03-18 MED ORDER — METFORMIN HCL ER 500 MG PO TB24: ORAL_TABLET | ORAL | 0 refills | Status: DC

## 2018-03-19 LAB — COMPLETE METABOLIC PANEL WITH GFR
AG Ratio: 1.8 (calc) (ref 1.0–2.5)
ALT: 57 U/L — ABNORMAL HIGH (ref 9–46)
AST: 55 U/L — ABNORMAL HIGH (ref 10–35)
Albumin: 4.6 g/dL (ref 3.6–5.1)
Alkaline phosphatase (APISO): 59 U/L (ref 40–115)
BUN/Creatinine Ratio: 20 (calc) (ref 6–22)
BUN: 14 mg/dL (ref 7–25)
CO2: 28 mmol/L (ref 20–32)
Calcium: 10.2 mg/dL (ref 8.6–10.3)
Chloride: 99 mmol/L (ref 98–110)
Creat: 0.69 mg/dL — ABNORMAL LOW (ref 0.70–1.33)
GFR, Est African American: 126 mL/min/{1.73_m2} (ref >=60)
GFR, Est Non African American: 109 mL/min/{1.73_m2} (ref >=60)
Globulin: 2.6 g/dL (calc) (ref 1.9–3.7)
Glucose, Bld: 216 mg/dL — ABNORMAL HIGH (ref 65–99)
Potassium: 4.6 mmol/L (ref 3.5–5.3)
Sodium: 139 mmol/L (ref 135–146)
Total Bilirubin: 0.6 mg/dL (ref 0.2–1.2)
Total Protein: 7.2 g/dL (ref 6.1–8.1)

## 2018-03-19 LAB — TSH: TSH: 2.53 mIU/L (ref 0.40–4.50)

## 2018-03-19 LAB — CBC WITH DIFFERENTIAL/PLATELET
Basophils Absolute: 29 cells/uL (ref 0–200)
Basophils Relative: 0.5 %
Eosinophils Absolute: 197 cells/uL (ref 15–500)
Eosinophils Relative: 3.4 %
HCT: 49.9 % (ref 38.5–50.0)
Hemoglobin: 17.1 g/dL (ref 13.2–17.1)
Lymphs Abs: 2825 cells/uL (ref 850–3900)
MCH: 28.9 pg (ref 27.0–33.0)
MCHC: 34.3 g/dL (ref 32.0–36.0)
MCV: 84.3 fL (ref 80.0–100.0)
MPV: 10.6 fL (ref 7.5–12.5)
Monocytes Relative: 6.9 %
Neutro Abs: 2349 cells/uL (ref 1500–7800)
Neutrophils Relative %: 40.5 %
Platelets: 206 10*3/uL (ref 140–400)
RBC: 5.92 10*6/uL — ABNORMAL HIGH (ref 4.20–5.80)
RDW: 12.8 % (ref 11.0–15.0)
Total Lymphocyte: 48.7 %
WBC mixed population: 400 cells/uL (ref 200–950)
WBC: 5.8 10*3/uL (ref 3.8–10.8)

## 2018-03-19 LAB — LIPID PANEL
Cholesterol: 176 mg/dL (ref <200)
HDL: 21 mg/dL — ABNORMAL LOW (ref >40)
Non-HDL Cholesterol (Calc): 155 mg/dL (calc) — ABNORMAL HIGH (ref <130)
Total CHOL/HDL Ratio: 8.4 (calc) — ABNORMAL HIGH (ref <5.0)
Triglycerides: 407 mg/dL — ABNORMAL HIGH (ref <150)

## 2018-03-19 LAB — INSULIN, RANDOM: Insulin: 13.6 u[IU]/mL (ref 2.0–19.6)

## 2018-03-19 LAB — HEMOGLOBIN A1C
Hgb A1c MFr Bld: 11 % of total Hgb — ABNORMAL HIGH (ref <5.7)
Mean Plasma Glucose: 269 (calc)
eAG (mmol/L): 14.9 (calc)

## 2018-03-19 LAB — VITAMIN D 25 HYDROXY (VIT D DEFICIENCY, FRACTURES): Vit D, 25-Hydroxy: 67 ng/mL (ref 30–100)

## 2018-03-19 LAB — MAGNESIUM: Magnesium: 1.6 mg/dL (ref 1.5–2.5)

## 2018-03-19 LAB — TESTOSTERONE: Testosterone: 235 ng/dL — ABNORMAL LOW (ref 250–827)

## 2018-03-19 LAB — URIC ACID: Uric Acid, Serum: 5.4 mg/dL (ref 4.0–8.0)

## 2018-03-21 ENCOUNTER — Encounter: Payer: Self-pay | Admitting: Internal Medicine

## 2018-03-22 ENCOUNTER — Other Ambulatory Visit: Payer: Self-pay | Admitting: Adult Health

## 2018-03-22 ENCOUNTER — Other Ambulatory Visit: Payer: Self-pay | Admitting: Internal Medicine

## 2018-05-23 DIAGNOSIS — J209 Acute bronchitis, unspecified: Secondary | ICD-10-CM | POA: Diagnosis not present

## 2018-06-21 ENCOUNTER — Encounter: Payer: Self-pay | Admitting: Internal Medicine

## 2018-06-21 ENCOUNTER — Ambulatory Visit: Payer: BLUE CROSS/BLUE SHIELD | Admitting: Internal Medicine

## 2018-06-21 VITALS — BP 138/90 | HR 88 | Temp 97.3°F | Ht 70.5 in | Wt 227.4 lb

## 2018-06-21 DIAGNOSIS — Z79899 Other long term (current) drug therapy: Secondary | ICD-10-CM

## 2018-06-21 DIAGNOSIS — E559 Vitamin D deficiency, unspecified: Secondary | ICD-10-CM

## 2018-06-21 DIAGNOSIS — Z130 Encounter for screening for diseases of the blood and blood-forming organs and certain disorders involving the immune mechanism: Secondary | ICD-10-CM | POA: Diagnosis not present

## 2018-06-21 DIAGNOSIS — Z13220 Encounter for screening for lipoid disorders: Secondary | ICD-10-CM | POA: Diagnosis not present

## 2018-06-21 DIAGNOSIS — Z1329 Encounter for screening for other suspected endocrine disorder: Secondary | ICD-10-CM | POA: Diagnosis not present

## 2018-06-21 DIAGNOSIS — E349 Endocrine disorder, unspecified: Secondary | ICD-10-CM

## 2018-06-21 DIAGNOSIS — Z111 Encounter for screening for respiratory tuberculosis: Secondary | ICD-10-CM

## 2018-06-21 DIAGNOSIS — R5383 Other fatigue: Secondary | ICD-10-CM

## 2018-06-21 DIAGNOSIS — E119 Type 2 diabetes mellitus without complications: Secondary | ICD-10-CM

## 2018-06-21 DIAGNOSIS — Z131 Encounter for screening for diabetes mellitus: Secondary | ICD-10-CM

## 2018-06-21 DIAGNOSIS — Z8249 Family history of ischemic heart disease and other diseases of the circulatory system: Secondary | ICD-10-CM

## 2018-06-21 DIAGNOSIS — Z136 Encounter for screening for cardiovascular disorders: Secondary | ICD-10-CM

## 2018-06-21 DIAGNOSIS — M1000 Idiopathic gout, unspecified site: Secondary | ICD-10-CM

## 2018-06-21 DIAGNOSIS — E782 Mixed hyperlipidemia: Secondary | ICD-10-CM

## 2018-06-21 DIAGNOSIS — Z87891 Personal history of nicotine dependence: Secondary | ICD-10-CM

## 2018-06-21 DIAGNOSIS — Z1212 Encounter for screening for malignant neoplasm of rectum: Secondary | ICD-10-CM

## 2018-06-21 DIAGNOSIS — Z0001 Encounter for general adult medical examination with abnormal findings: Secondary | ICD-10-CM

## 2018-06-21 DIAGNOSIS — Z125 Encounter for screening for malignant neoplasm of prostate: Secondary | ICD-10-CM

## 2018-06-21 DIAGNOSIS — I10 Essential (primary) hypertension: Secondary | ICD-10-CM

## 2018-06-21 DIAGNOSIS — R350 Frequency of micturition: Secondary | ICD-10-CM | POA: Diagnosis not present

## 2018-06-21 DIAGNOSIS — N401 Enlarged prostate with lower urinary tract symptoms: Secondary | ICD-10-CM | POA: Diagnosis not present

## 2018-06-21 DIAGNOSIS — Z0000 Encounter for general adult medical examination without abnormal findings: Secondary | ICD-10-CM

## 2018-06-21 DIAGNOSIS — F17200 Nicotine dependence, unspecified, uncomplicated: Secondary | ICD-10-CM

## 2018-06-21 DIAGNOSIS — Z1211 Encounter for screening for malignant neoplasm of colon: Secondary | ICD-10-CM

## 2018-06-21 NOTE — Progress Notes (Signed)
[facility]   Shaun [name], M.D.     Dyanne Carrel. Steffanie Dunn, P.A.-C Judd Gaudier, DNP [facility]                [address]                [place], [place]. [address] Telephone [phone] Telefax [phone] Annual  Screening/Preventative Visit  & Comprehensive Evaluation & Examination     This very nice 52 y.o. [name] presents for a Screening /Preventative Visit & comprehensive evaluation and management of multiple medical co-morbidities.  Patient has been followed for HTN, HLD, T2_NIDDM  and Vitamin D Deficiency. Patient also has hx/o Gout in the past and has been off of meds.      HTN predates circa 1999. Patient's BP has been controlled at home.  Today's BP is  sl elevated at 138/90. Patient denies any cardiac symptoms as chest pain, palpitations, shortness of breath, dizziness or ankle swelling.     Patient's hyperlipidemia is controlled with diet and medications. Patient denies myalgias or other medication SE's. Last lipids were at goal for Chol albeit elevated Trig's: Lab Results  Component Value Date   CHOL 176 03/18/2018   HDL 21 (L) 03/18/2018   LDLCALC not calculated 03/18/2018   TRIG 407 (H) 03/18/2018   CHOLHDL 8.4 (H) 03/18/2018      Patient has hx/o Morbid Obesity (BMI 32+) and T2_NIDDM (2004).  Patient denies reactive hypoglycemic symptoms, visual blurring, diabetic polys or paresthesias. Patient reports CBG's are elevated in the range 175-240 at meals. Patient is poorly compliant with diet and last A1c was not at goal: Lab Results  Component Value Date   HGBA1C 11.0 (H) 03/18/2018       Finally, patient has history of Vitamin D Deficiency ("15" / 2009)  and last vitamin D was at goal: Lab Results  Component Value Date   VD25OH 67 03/18/2018   Current Outpatient Medications on File Prior to Visit  Medication Sig  . aspirin 81 MG tablet Take  daily.  Marland Kitchen atenolol (TENORMIN) 100 MG tablet TAKE 1  TABLET  DAILY  . VITAMIN D 2000 units  Take 4 capsules  daily.   . Cinnamon 500 MG  Take 500 mg  4 x / daily.  . fenofibrate 160 MG  TAKE 1 TABLET  DAILY  . glyBURIDE 5 MG tablet TAKE 1 TABLET  3 x / DAILY WITH MEALS  . metFORMIN-XR 500 MG TAKE 2 TABLETS  2 x / DAILY WITH FOOD  . sitaGLIPtin 100 MG tablet TAKE 1 TABLET  DAILY FOR DIABETES  . ANDROGEL PUMP  (1.62%) GEL Patient not taking   Allergies  Allergen Reactions  . Zocor [Simvastatin]     Headache  . Zoloft [Sertraline Hcl]     dysphona   Past Medical History:  Diagnosis Date  . Asthma   . Diabetes mellitus without complication (HCC)   . Fatty liver disease, nonalcoholic   . GERD (gastroesophageal reflux disease)   . Gout   . Hyperlipidemia   . Hypertension   . Hypogonadism male   . Obesity   . Venous insufficiency    Health Maintenance  Topic Date Due  . PNEUMOCOCCAL POLYSACCHARIDE VACCINE AGE 12-64 HIGH RISK  06/27/1967  . OPHTHALMOLOGY EXAM  06/27/1975  . COLONOSCOPY  06/27/2015  . INFLUENZA VACCINE  03/04/2018  . FOOT EXAM  05/13/2018  . URINE MICROALBUMIN  05/13/2018  . HEMOGLOBIN A1C  09/18/2018  . TETANUS/TDAP  02/10/2023  . HIV Screening  Completed   Immunization History  Administered Date(s) Administered  . PPD Test 02/17/2014, 03/08/2015, 03/27/2016, 05/13/2017  . Pneumococcal-Unspecified 02/09/2013  . Tdap 02/09/2013   Last Colon - 08/08/1995 - Dr Ewing Schlein - Negative  Past Surgical History:  Procedure Laterality Date  . ANTERIOR CRUCIATE LIGAMENT REPAIR Left 2001  . CYSTOSCOPY  1993  . ESOPHAGOGASTRODUODENOSCOPY  1995, 2002   gastritis, esophagititis  . KNEE ARTHROSCOPY Right 2002, 1995   Family History  Problem Relation Age of Onset  . Diabetes Mother   . Hypertension Mother   . Gout Mother   . COPD Father   . Cancer Sister 107       appendix  . Gout Brother   . Hyperlipidemia Brother   . Hypertension Brother   . Hypertension Brother   . Hyperlipidemia Brother   . Gout Brother   .  Diabetes Brother   . COPD Sister    Social History   Socioeconomic History  . Marital status: Divorced  . Number of children: 3 daughters  Occupational History  . production work  Tobacco Use  . Smoking status: Former Smoker    Packs/day: 1.00    Years: 15.00    Pack years: 15.00    Types: Cigarettes    Last attempt to quit: 08/07/2000    Years since quitting: 17.8  . Smokeless tobacco: Never Used  Substance and Sexual Activity  . Alcohol use: Yes    Comment: social  . Drug use: No  . Sexual activity: Not on file    ROS Constitutional: Denies fever, chills, weight loss/gain, headaches, insomnia,  night sweats or change in appetite. Does c/o fatigue. Eyes: Denies redness, blurred vision, diplopia, discharge, itchy or watery eyes.  ENT: Denies discharge, congestion, post nasal drip, epistaxis, sore throat, earache, hearing loss, dental pain, Tinnitus, Vertigo, Sinus pain or snoring.  Cardio: Denies chest pain, palpitations, irregular heartbeat, syncope, dyspnea, diaphoresis, orthopnea, PND, claudication or edema Respiratory: denies cough, dyspnea, DOE, pleurisy, hoarseness, laryngitis or wheezing.  Gastrointestinal: Denies dysphagia, heartburn, reflux, water brash, pain, cramps, nausea, vomiting, bloating, diarrhea, constipation, hematemesis, melena, hematochezia, jaundice or hemorrhoids Genitourinary: Denies dysuria, frequency, urgency, nocturia, hesitancy, discharge, hematuria or flank pain Musculoskeletal: Denies arthralgia, myalgia, stiffness, Jt. Swelling, pain, limp or strain/sprain. Denies Falls. Skin: Denies puritis, rash, hives, warts, acne, eczema or change in skin lesion Neuro: No weakness, tremor, incoordination, spasms, paresthesia or pain Psychiatric: Denies confusion, memory loss or sensory loss. Denies Depression. Endocrine: Denies change in weight, skin, hair change, nocturia, and paresthesia, diabetic polys, visual blurring or hyper / hypo glycemic episodes.   Heme/Lymph: No excessive bleeding, bruising or enlarged lymph nodes.  Physical Exam  BP 138/90   Pulse 88   Temp (!) 97.3 F (36.3 C)   Ht 5' 10.5" (1.791 m)   Wt 227 lb 6.4 oz (103.1 kg)   SpO2 97%   BMI 32.17 kg/m   General Appearance:  Overt nourished and well groomed and in no apparent distress.  Eyes: PERRLA, EOMs, conjunctiva no swelling or erythema, normal fundi and vessels. Sinuses: No frontal/maxillary tenderness ENT/Mouth: EACs patent / TMs  nl. Nares clear without erythema, swelling, mucoid exudates. Oral hygiene is good. No erythema, swelling, or exudate. Tongue normal, non-obstructing. Tonsils not swollen or erythematous. Hearing normal.  Neck: Supple, thyroid not palpable. No bruits, nodes or JVD. Respiratory: Respiratory effort normal.  BS equal and clear bilateral without rales, rhonci, wheezing or stridor. Cardio:  Heart sounds are normal with regular rate and rhythm and no murmurs, rubs or gallops. Peripheral pulses are normal and equal bilaterally without edema. No aortic or femoral bruits. Chest: symmetric with normal excursions and percussion.  Abdomen: Soft, with Nl bowel sounds. Nontender, no guarding, rebound, hernias, masses, or organomegaly.  Lymphatics: Non tender without lymphadenopathy.  Genitourinary: No hernias.Testes nl. DRE - prostate nl for age - smooth & firm w/o nodules. Musculoskeletal: Full ROM all peripheral extremities, joint stability, 5/5 strength, and normal gait. Skin: Warm and dry without rashes, lesions, cyanosis, clubbing or  ecchymosis.  Neuro: Cranial nerves intact, reflexes equal bilaterally. Normal muscle tone, no cerebellar symptoms. Sensation intact.  Pysch: Alert and oriented X 3 with normal affect, insight and judgment appropriate.   Assessment and Plan  1. Annual Preventative/Screening Exam   1. Encounter for general adult medical examination with abnormal findings   2. Essential hypertension  - EKG 12-Lead - US,  RETROPERITNL ABD,  LTD - Urinalysis, Routine w reflex microscopic - Microalbumin / creatinine urine ratio - CBC with Differential/Platelet - COMPLETE METABOLIC PANEL WITH GFR - Magnesium - TSH  3. Hyperlipidemia, mixed  - EKG 12-Lead - US, RETROPERITNL ABD,  LTD - Lipid panel - TSH  4. Diabetes mellitus without complication (HCC)  - EKG 12-Lead - US, RETROPERITNL ABD,  LTD - HM DIABETES FOOT EXAM - LOW EXTREMITY NEUR EXAM DOCUM - Hemoglobin A1c - Insulin, random  5. Vitamin D deficiency  - VITAMIN D 25 Hydroxy (Vit-D Deficiency, Fractures)  6. Testosterone deficiency  - Testosterone  7. Idiopathic gout  - Uric acid  8. Screening for tuberculosis  - PPD  9. Screening for colorectal cancer  - POC Hemoccult Bld/Stl  10. Prostate cancer screening  - PSA  11. Screening for ischemic heart disease  - EKG 12-Lead  12. Smoker  - EKG 12-Lead - US, RETROPERITNL ABD,  LTD  13. FH: hypertension  - EKG 12-Lead - US, RETROPERITNL ABD,  LTD  14. Screening for AAA (aortic abdominal aneurysm)  - US, RETROPERITNL ABD,  LTD  15. Fatigue, unspecified type  - Iron,Total/Total Iron Binding Cap - Testosterone - Vitamin B12  16. Medication management  - Urinalysis, Routine w reflex microscopic - Microalbumin / creatinine urine ratio - Uric acid - CBC with Differential/Platelet - COMPLETE METABOLIC PANEL WITH GFR - Magnesium - Lipid panel - TSH - Hemoglobin A1c - Insulin, random - VITAMIN D 25 Hydroxyl       Patient was counseled in prudent diet, weight control to achieve/maintain BMI less than 25, BP monitoring, regular exercise and medications as discussed.  Discussed med effects and SE's. Routine screening labs and tests as requested with regular follow-up as recommended. Over 40 minutes of exam, counseling, chart review and high complex critical decision making was performed

## 2018-06-21 NOTE — Patient Instructions (Signed)

## 2018-06-22 LAB — CBC WITH DIFFERENTIAL/PLATELET
Basophils Absolute: 59 cells/uL (ref 0–200)
Basophils Relative: 0.7 %
Eosinophils Absolute: 252 cells/uL (ref 15–500)
Eosinophils Relative: 3 %
HCT: 50.6 % — ABNORMAL HIGH (ref 38.5–50.0)
Hemoglobin: 17.2 g/dL — ABNORMAL HIGH (ref 13.2–17.1)
Lymphs Abs: 3620 cells/uL (ref 850–3900)
MCH: 28.8 pg (ref 27.0–33.0)
MCHC: 34 g/dL (ref 32.0–36.0)
MCV: 84.6 fL (ref 80.0–100.0)
MPV: 10.4 fL (ref 7.5–12.5)
Monocytes Relative: 6 %
Neutro Abs: 3965 cells/uL (ref 1500–7800)
Neutrophils Relative %: 47.2 %
Platelets: 284 10*3/uL (ref 140–400)
RBC: 5.98 10*6/uL — ABNORMAL HIGH (ref 4.20–5.80)
RDW: 13.7 % (ref 11.0–15.0)
Total Lymphocyte: 43.1 %
WBC mixed population: 504 cells/uL (ref 200–950)
WBC: 8.4 10*3/uL (ref 3.8–10.8)

## 2018-06-22 LAB — URIC ACID: Uric Acid, Serum: 5.7 mg/dL (ref 4.0–8.0)

## 2018-06-22 LAB — LIPID PANEL
Cholesterol: 174 mg/dL (ref <200)
HDL: 22 mg/dL — ABNORMAL LOW (ref >40)
Non-HDL Cholesterol (Calc): 152 mg/dL (calc) — ABNORMAL HIGH (ref <130)
Total CHOL/HDL Ratio: 7.9 (calc) — ABNORMAL HIGH (ref <5.0)
Triglycerides: 451 mg/dL — ABNORMAL HIGH (ref <150)

## 2018-06-22 LAB — HEMOGLOBIN A1C
Hgb A1c MFr Bld: 10.4 % of total Hgb — ABNORMAL HIGH (ref <5.7)
Mean Plasma Glucose: 252 (calc)
eAG (mmol/L): 13.9 (calc)

## 2018-06-22 LAB — URINALYSIS, ROUTINE W REFLEX MICROSCOPIC
Bilirubin Urine: NEGATIVE
Hgb urine dipstick: NEGATIVE
Ketones, ur: NEGATIVE
Leukocytes, UA: NEGATIVE
Nitrite: NEGATIVE
Protein, ur: NEGATIVE
Specific Gravity, Urine: 1.03 (ref 1.001–1.03)
pH: <=5 (ref 5.0–8.0)

## 2018-06-22 LAB — MICROALBUMIN / CREATININE URINE RATIO
Creatinine, Urine: 89 mg/dL (ref 20–320)
Microalb Creat Ratio: 18 mcg/mg creat (ref <30)
Microalb, Ur: 1.6 mg/dL

## 2018-06-22 LAB — IRON, TOTAL/TOTAL IRON BINDING CAP
%SAT: 32 % (calc) (ref 20–48)
Iron: 121 ug/dL (ref 50–180)
TIBC: 375 mcg/dL (calc) (ref 250–425)

## 2018-06-22 LAB — COMPLETE METABOLIC PANEL WITH GFR
AG Ratio: 1.6 (calc) (ref 1.0–2.5)
ALT: 36 U/L (ref 9–46)
AST: 19 U/L (ref 10–35)
Albumin: 4.5 g/dL (ref 3.6–5.1)
Alkaline phosphatase (APISO): 62 U/L (ref 40–115)
BUN: 17 mg/dL (ref 7–25)
CO2: 29 mmol/L (ref 20–32)
Calcium: 10.8 mg/dL — ABNORMAL HIGH (ref 8.6–10.3)
Chloride: 101 mmol/L (ref 98–110)
Creat: 0.79 mg/dL (ref 0.70–1.33)
GFR, Est African American: 120 mL/min/{1.73_m2} (ref >=60)
GFR, Est Non African American: 103 mL/min/{1.73_m2} (ref >=60)
Globulin: 2.8 g/dL (calc) (ref 1.9–3.7)
Glucose, Bld: 299 mg/dL — ABNORMAL HIGH (ref 65–99)
Potassium: 4.8 mmol/L (ref 3.5–5.3)
Sodium: 137 mmol/L (ref 135–146)
Total Bilirubin: 0.6 mg/dL (ref 0.2–1.2)
Total Protein: 7.3 g/dL (ref 6.1–8.1)

## 2018-06-22 LAB — MAGNESIUM: Magnesium: 1.7 mg/dL (ref 1.5–2.5)

## 2018-06-22 LAB — VITAMIN D 25 HYDROXY (VIT D DEFICIENCY, FRACTURES): Vit D, 25-Hydroxy: 58 ng/mL (ref 30–100)

## 2018-06-22 LAB — TESTOSTERONE: Testosterone: 172 ng/dL — ABNORMAL LOW (ref 250–827)

## 2018-06-22 LAB — VITAMIN B12: Vitamin B-12: 512 pg/mL (ref 200–1100)

## 2018-06-22 LAB — TSH: TSH: 2.22 mIU/L (ref 0.40–4.50)

## 2018-06-22 LAB — INSULIN, RANDOM: Insulin: 8 u[IU]/mL (ref 2.0–19.6)

## 2018-06-22 LAB — PSA: PSA: 0.2 ng/mL (ref <=4.0)

## 2018-06-24 ENCOUNTER — Other Ambulatory Visit: Payer: Self-pay | Admitting: Internal Medicine

## 2018-06-24 MED ORDER — AZITHROMYCIN 250 MG PO TABS: ORAL_TABLET | ORAL | 1 refills | Status: DC

## 2018-06-24 MED ORDER — PREDNISONE 20 MG PO TABS: ORAL_TABLET | ORAL | 0 refills | Status: DC

## 2018-06-24 MED ORDER — PROMETHAZINE-DM 6.25-15 MG/5ML PO SYRP: ORAL_SOLUTION | ORAL | 1 refills | Status: DC

## 2018-08-07 DIAGNOSIS — M545 Low back pain: Secondary | ICD-10-CM | POA: Diagnosis not present

## 2018-08-07 DIAGNOSIS — M542 Cervicalgia: Secondary | ICD-10-CM | POA: Diagnosis not present

## 2018-08-09 ENCOUNTER — Encounter (INDEPENDENT_AMBULATORY_CARE_PROVIDER_SITE_OTHER): Payer: Self-pay | Admitting: Orthopaedic Surgery

## 2018-08-11 ENCOUNTER — Ambulatory Visit (INDEPENDENT_AMBULATORY_CARE_PROVIDER_SITE_OTHER): Payer: BLUE CROSS/BLUE SHIELD | Admitting: Orthopaedic Surgery

## 2018-08-11 ENCOUNTER — Encounter (INDEPENDENT_AMBULATORY_CARE_PROVIDER_SITE_OTHER): Payer: Self-pay | Admitting: Orthopaedic Surgery

## 2018-08-11 VITALS — BP 138/91 | HR 75 | Ht 70.5 in | Wt 230.0 lb

## 2018-08-11 DIAGNOSIS — M542 Cervicalgia: Secondary | ICD-10-CM

## 2018-08-11 DIAGNOSIS — M5136 Other intervertebral disc degeneration, lumbar region: Secondary | ICD-10-CM

## 2018-08-11 NOTE — Progress Notes (Signed)
Office Visit Note   Patient: Shaun [name]           Date of Birth: [birth year]           MRN: [id number] Visit Date: 08/11/2018              Requested by: Lucky CowboyMcKeown, William, MD [address] [place] [address] PCP: Lucky CowboyMcKeown, William, MD   Assessment & Plan: Visit Diagnoses:  1. Neck pain, acute   2. Other intervertebral disc degeneration, lumbar region     Plan: We discussed a walking stretching program.  We will check him back again in 1 week.  The night pain that he has is of concern and we may need to consider diagnostic imaging.  No evidence of radiculopathy or myelopathy on exam.  Follow-Up Instructions: Return in about 1 week (around 08/18/2018).   Orders:  No orders of the defined types were placed in this encounter.  No orders of the defined types were placed in this encounter.     Procedures: No procedures performed   Clinical Data: No additional findings.   Subjective: Chief Complaint  Patient presents with  . Neck - Pain  . Lower Back - Pain    HPI 53 year old male seen with low back pain after being seen at orthopedic urgent care 1/40/20 where x-rays were obtained which showed in the lumbar spine few millimeters retrolisthesis at L2-3 and L3-4 without acute changes.  Cervical spine x-rays demonstrated disc space narrowing at C6-7 with spurring loss of 50% disc height and uncinate hypertrophy at C5-6 C6-7.  He has had more low back pain than neck pain.  He denies chills or fever.  No lower extremity weakness.  His back pain is been worse and tends to hit him just at night close to midnight.  He has difficulty getting into a position where he gets relief.  History of lumbar ruptured disc 15 years ago.  He still has pain between his shoulder blades and that pain radiates to the posterior occiput and associated with headaches and sometimes radiate to his eyes.  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  He does have diabetes.  He took some prednisone when he was sick a few weeks ago and had associated hyperglycemia which was a concern to him.  Uric acid November 2019.  A1c on 06/21/2018 was 10.4 elevated.  Review of Systems positive for hypertension, hyperlipidemia, type 2 diabetes on oral medication.  Former smoker.  Obesity BMI greater than 30.  Positive for testosterone deficiency on replacement, polycythemia.  Low vitamin D at 57 on supplement.   Objective: Vital Signs: BP (!) 138/91 (BP Location: Right Arm, Patient Position: Sitting)   Pulse 75   Ht 5' 10.5" (1.791 m)   Wt 230 lb (104.3 kg)   BMI 32.54 kg/m   Physical Exam Constitutional:      Appearance: He is well-developed.  HENT:     Head: Normocephalic and atraumatic.  Eyes:     Pupils: Pupils are equal, round, and reactive to light.  Neck:     Thyroid: No thyromegaly.     Trachea: No tracheal deviation.  Cardiovascular:     Rate and Rhythm: Normal rate.  Pulmonary:     Effort: Pulmonary effort is normal.     Breath sounds: No wheezing.  Abdominal:     General: Bowel sounds are normal.     Palpations: Abdomen  is soft.  Skin:    General: Skin is warm and dry.     Capillary Refill: Capillary refill takes less than 2 seconds.  Neurological:     Mental Status: He is alert and oriented to person, place, and time.  Psychiatric:        Behavior: Behavior normal.        Thought Content: Thought content normal.        Judgment: Judgment normal.     Ortho Exam patient has some discomfort with cervical rotation.  No change with compression.  Upper extremity reflexes are 2+.  He has some pain with forward flexion normal extension.  There is mild tenderness palpation of the lumbar spine.  Negative logroll to the hips.  Knee and ankle jerk are intact he is able to heel and toe walk.  No trochanteric bursal tenderness.  Anterior tib gastrocsoleus EHL peroneals are all strong take good resistance and are  symmetrical.  Distal pulses are palpable.  Specialty Comments:  No specialty comments available.  Imaging: No results found.   PMFS History: Patient Active Problem List   Diagnosis Date Noted  . Former smoker 03/17/2018  . FH: hypertension 03/17/2018  . Polycythemia 09/01/2017  . Obesity (BMI 30.0-34.9) 03/08/2015  . Vitamin D deficiency 08/18/2013  . Medication management 08/07/2013  . Essential hypertension   . Hyperlipidemia, mixed   . T2_NIDDM   . GERD   . Asthma   . Idiopathic gout   . Testosterone deficiency    Past Medical History:  Diagnosis Date  . Asthma   . Diabetes mellitus without complication (HCC)   . Fatty liver disease, nonalcoholic   . GERD (gastroesophageal reflux disease)   . Gout   . Hyperlipidemia   . Hypertension   . Hypogonadism male   . Obesity   . Venous insufficiency     Family History  Problem Relation Age of Onset  . Diabetes Mother   . Hypertension Mother   . Gout Mother   . COPD Father   . Cancer Sister 108       appendix  . Gout Brother   . Hyperlipidemia Brother   . Hypertension Brother   . Hypertension Brother   . Hyperlipidemia Brother   . Gout Brother   . Diabetes Brother   . COPD Sister     Past Surgical History:  Procedure Laterality Date  . ANTERIOR CRUCIATE LIGAMENT REPAIR Left 2001  . CYSTOSCOPY  1993  . ESOPHAGOGASTRODUODENOSCOPY  1995, 2002   gastritis, esophagititis  . KNEE ARTHROSCOPY Right 2002, 1995   Social History   Occupational History  . Not on file  Tobacco Use  . Smoking status: Former Smoker    Packs/day: 1.00    Years: 15.00    Pack years: 15.00    Types: Cigarettes    Last attempt to quit: 08/07/2000    Years since quitting: 18.0  . Smokeless tobacco: Never Used  Substance and Sexual Activity  . Alcohol use: Yes    Comment: social  . Drug use: No  . Sexual activity: Not on file

## 2018-08-17 ENCOUNTER — Ambulatory Visit (INDEPENDENT_AMBULATORY_CARE_PROVIDER_SITE_OTHER): Payer: BLUE CROSS/BLUE SHIELD | Admitting: Orthopaedic Surgery

## 2018-08-17 VITALS — BP 155/103 | HR 68 | Ht 70.5 in | Wt 230.0 lb

## 2018-08-17 DIAGNOSIS — M542 Cervicalgia: Secondary | ICD-10-CM | POA: Diagnosis not present

## 2018-08-17 MED ORDER — HYDROCODONE-ACETAMINOPHEN 10-325 MG PO TABS: 1.0000 | ORAL_TABLET | Freq: Four times a day (QID) | ORAL | 0 refills | Status: DC | PRN

## 2018-08-17 NOTE — Progress Notes (Signed)
Office Visit Note   Patient: Shaun [name]           Date of Birth: 1965-08-22           MRN: [id number] Visit Date: 08/17/2018              Requested by: Lucky CowboyMcKeown, William, MD [address] [place], [place] [address] PCP: Lucky CowboyMcKeown, William, MD   Assessment & Plan: Visit Diagnoses:  1. Neck pain, acute     Plan: Patient failed exercise program anti-inflammatories prednisone Dosepak pain is increased he now rates it is severe cannot sleep having problems working and states something has to be done.  We will proceed with MRI scan see him back after the scan is obtained.  Follow-Up Instructions: No follow-ups on file.   Orders:  Orders Placed This Encounter  Procedures  . MR Cervical Spine w/o contrast   Meds ordered this encounter  Medications  . HYDROcodone-acetaminophen (NORCO) 10-325 MG tablet    Sig: Take 1 tablet by mouth every 6 (six) hours as needed.    Dispense:  20 tablet    Refill:  0      Procedures: No procedures performed   Clinical Data: No additional findings.   Subjective: Chief Complaint  Patient presents with  . Neck - Follow-up    HPI 53 year old male returns and states his severe neck pain is progressed with now neck and right arm pain that radiates down the radial side of his hand he states he is not able to sleep cannot lay flat wakes up at night due to the pain.  Pain is constant during the day and at times severe if he tries to turn his neck.  Prednisone pack made him feel slightly better as he decreased the dose has had recurrence of pain.  He states he is not able to tolerate the pain due to the severity and constant nature.  Patient does have type II non-insulin-dependent diabetes.  Also history of gout.  Review of Systems update from 08/11/2018 performed and unchanged other than as mentioned in HPI.  Poor diabetic control with hemoglobin A1c 10.4 on 06/21/2018.   Objective: Vital Signs: BP (!) 155/103   Pulse 68   Ht 5'  10.5" (1.791 m)   Wt 230 lb (104.3 kg)   BMI 32.54 kg/m   Physical Exam Constitutional:      Appearance: He is well-developed.  HENT:     Head: Normocephalic and atraumatic.  Eyes:     Pupils: Pupils are equal, round, and reactive to light.  Neck:     Thyroid: No thyromegaly.     Trachea: No tracheal deviation.  Cardiovascular:     Rate and Rhythm: Normal rate.  Pulmonary:     Effort: Pulmonary effort is normal.     Breath sounds: No wheezing.  Abdominal:     General: Bowel sounds are normal.     Palpations: Abdomen is soft.  Skin:    General: Skin is warm and dry.     Capillary Refill: Capillary refill takes less than 2 seconds.  Neurological:     Mental Status: He is alert and oriented to person, place, and time.  Psychiatric:        Behavior: Behavior normal.        Thought Content: Thought content normal.        Judgment: Judgment normal.     Ortho Exam patient has pain with forward flexion extension cervical spine.  Has brachial  plexus tenderness and 2+ biceps triceps brachioradialis reflex.  No lower extremity weakness no clonus normal gait pattern.  Distal pulses are intact.  No supraclavicular lymphadenopathy. Specialty Comments:  No specialty comments available.  Imaging: No results found.   PMFS History: Patient Active Problem List   Diagnosis Date Noted  . Former smoker 03/17/2018  . FH: hypertension 03/17/2018  . Polycythemia 09/01/2017  . Obesity (BMI 30.0-34.9) 03/08/2015  . Vitamin D deficiency 08/18/2013  . Medication management 08/07/2013  . Essential hypertension   . Hyperlipidemia, mixed   . T2_NIDDM   . GERD   . Asthma   . Idiopathic gout   . Testosterone deficiency    Past Medical History:  Diagnosis Date  . Asthma   . Diabetes mellitus without complication (HCC)   . Fatty liver disease, nonalcoholic   . GERD (gastroesophageal reflux disease)   . Gout   . Hyperlipidemia   . Hypertension   . Hypogonadism male   . Obesity   .  Venous insufficiency     Family History  Problem Relation Age of Onset  . Diabetes Mother   . Hypertension Mother   . Gout Mother   . COPD Father   . Cancer Sister 11       appendix  . Gout Brother   . Hyperlipidemia Brother   . Hypertension Brother   . Hypertension Brother   . Hyperlipidemia Brother   . Gout Brother   . Diabetes Brother   . COPD Sister     Past Surgical History:  Procedure Laterality Date  . ANTERIOR CRUCIATE LIGAMENT REPAIR Left 2001  . CYSTOSCOPY  1993  . ESOPHAGOGASTRODUODENOSCOPY  1995, 2002   gastritis, esophagititis  . KNEE ARTHROSCOPY Right 2002, 1995   Social History   Occupational History  . Not on file  Tobacco Use  . Smoking status: Former Smoker    Packs/day: 1.00    Years: 15.00    Pack years: 15.00    Types: Cigarettes    Last attempt to quit: 08/07/2000    Years since quitting: 18.0  . Smokeless tobacco: Never Used  Substance and Sexual Activity  . Alcohol use: Yes    Comment: social  . Drug use: No  . Sexual activity: Not on file

## 2018-08-18 ENCOUNTER — Encounter (INDEPENDENT_AMBULATORY_CARE_PROVIDER_SITE_OTHER): Payer: Self-pay | Admitting: Orthopaedic Surgery

## 2018-08-20 ENCOUNTER — Ambulatory Visit (INDEPENDENT_AMBULATORY_CARE_PROVIDER_SITE_OTHER): Payer: Self-pay | Admitting: Orthopaedic Surgery

## 2018-08-21 ENCOUNTER — Ambulatory Visit (HOSPITAL_BASED_OUTPATIENT_CLINIC_OR_DEPARTMENT_OTHER)
Admission: RE | Admit: 2018-08-21 | Discharge: 2018-08-21 | Disposition: A | Discharge status: Home / Self Care | Payer: BLUE CROSS/BLUE SHIELD | Source: Ambulatory Visit | Attending: Orthopaedic Surgery | Admitting: Orthopaedic Surgery

## 2018-08-21 DIAGNOSIS — M542 Cervicalgia: Secondary | ICD-10-CM | POA: Diagnosis not present

## 2018-08-31 ENCOUNTER — Ambulatory Visit (INDEPENDENT_AMBULATORY_CARE_PROVIDER_SITE_OTHER): Payer: BLUE CROSS/BLUE SHIELD | Admitting: Orthopaedic Surgery

## 2018-08-31 ENCOUNTER — Encounter (INDEPENDENT_AMBULATORY_CARE_PROVIDER_SITE_OTHER): Payer: Self-pay | Admitting: Orthopaedic Surgery

## 2018-08-31 VITALS — BP 146/85 | HR 70 | Ht 70.5 in | Wt 225.0 lb

## 2018-08-31 DIAGNOSIS — M4722 Other spondylosis with radiculopathy, cervical region: Secondary | ICD-10-CM

## 2018-08-31 DIAGNOSIS — E119 Type 2 diabetes mellitus without complications: Secondary | ICD-10-CM | POA: Diagnosis not present

## 2018-09-01 ENCOUNTER — Encounter (INDEPENDENT_AMBULATORY_CARE_PROVIDER_SITE_OTHER): Payer: Self-pay | Admitting: Orthopaedic Surgery

## 2018-09-01 NOTE — Progress Notes (Signed)
Office Visit Note   Patient: Shaun [name]           Date of Birth: 1966/02/21           MRN: [id number] Visit Date: 08/31/2018              Requested by: Lucky CowboyMcKeown, William, MD [address] [place], [place] [id number] PCP: Lucky CowboyMcKeown, William, MD   Assessment & Plan: Visit Diagnoses:  1. Other spondylosis with radiculopathy, cervical region   2. T2_NIDDM     Plan: We discussed with the patient that he needs to get his A1c below 8 and headed toward 7.5 before we could consider elective cervical spine surgery.  Currently his A1c has been in the 9-11 range.  He can talk with Dr. Salomon FickMcCown about possibly starting on insulin.  Patient states he lost more than 50 pounds but still has problems with hyperglycemia.  Oral medications have been switched a few times trying to improve his A1c.  Patient states his pain is significant and is interested in surgery.  He will talk with Dr. Michele McalpineMcAllen and needs to get improvement in his diabetes.  We discussed single level fusion at the C3-4 level with the symptoms that he has on the left foraminal disc protrusion.  Central stenosis.  Plan single level C3-4 cervical fusion if his symptoms do not change.  He will contact us once his diabetic status improves.  MRI scan was reviewed we discussed postoperative collar for 6 weeks dysphasia dysphonia pseudoarthrosis possible progression at other levels particularly at C6-7 which bothers him some on the right side but nothing compared to the left C3-4 problem.  Questions were elicited and answered.  Follow-Up Instructions: No follow-ups on file.   Orders:  No orders of the defined types were placed in this encounter.  No orders of the defined types were placed in this encounter.     Procedures: No procedures performed   Clinical Data: No additional findings.   Subjective: Chief Complaint  Patient presents with  . Neck - Pain, Follow-up    MRI Cervical Spine Review    HPI 53 year old male  seen with ongoing significant neck pain.  He is on hydrocodone that he is taking at night trying to help him sleep.  He rates his pain is severe.  He has his most significant problem with pain in the left shoulder that radiates down stop short of the elbow.  He is also had some pain on the opposite right side that goes down to the middle and radial side of his right hand but this is not as severe.  Patient denies gait problems no problems with stairs.  Review of Systems patient with diabetes been on multiple oral medications but A1c remains elevated running in the 9-11 range.  Patient lost more than 50 pounds but still has elevated A1c and hyperglycemia.  Former smoker.  Polycythemia low vitamin D, testosterone supplement.  Otherwise negative is obtains HPI.   Objective: Vital Signs: BP (!) 146/85   Pulse 70   Ht 5' 10.5" (1.791 m)   Wt 225 lb (102.1 kg)   BMI 31.83 kg/m   Physical Exam Constitutional:      Appearance: He is well-developed.  HENT:     Head: Normocephalic and atraumatic.  Eyes:     Pupils: Pupils are equal, round, and reactive to light.  Neck:     Thyroid: No thyromegaly.     Trachea: No tracheal deviation.  Cardiovascular:  Rate and Rhythm: Normal rate.  Pulmonary:     Effort: Pulmonary effort is normal.     Breath sounds: No wheezing.  Abdominal:     General: Bowel sounds are normal.     Palpations: Abdomen is soft.  Skin:    General: Skin is warm and dry.     Capillary Refill: Capillary refill takes less than 2 seconds.  Neurological:     Mental Status: He is alert and oriented to person, place, and time.  Psychiatric:        Behavior: Behavior normal.        Thought Content: Thought content normal.        Judgment: Judgment normal.     Ortho Exam patient has pain with cervical compression.  Brachial plexus tenderness worse on the left than right with pain that radiates to deltoid region.  Has some brachial plexus tenderness on the opposite right side  as well.  Upper extremity reflexes are 2+ and symmetrical.  Specialty Comments:  No specialty comments available.  Imaging: CLINICAL DATA:  53 year old male with chronic neck pain. Acute pain radiating to the left scapula and shoulder. Left facial pain for 3 weeks. Distal right arm numbness.  EXAM: MRI CERVICAL SPINE WITHOUT CONTRAST  TECHNIQUE: Multiplanar, multisequence MR imaging of the cervical spine was performed. No intravenous contrast was administered.  COMPARISON:  Cervical spine radiographs 11/16/2013.  FINDINGS: Alignment: Straightening of cervical lordosis appears mildly increased since 2015. No spondylolisthesis.  Vertebrae: No marrow edema or evidence of acute osseous abnormality. Visualized bone marrow signal is within normal limits. Degenerative marrow endplate changes most pronounced at C6-C7.  Cord: Spinal cord signal is within normal limits at all visualized levels.  Posterior Fossa, vertebral arteries, paraspinal tissues: Cervicomedullary junction is within normal limits. Negative visible brain parenchyma. Preserved major vascular flow voids in the neck. Negative neck soft tissues. Negative visible lung apices.  Disc levels:  C2-C3:  Negative.  C3-C4: Left subarticular/foraminal small disc protrusion superimposed on circumferential disc bulge and endplate spurring. Borderline to mild spinal stenosis. Mild to moderate left C4 foraminal stenosis.  C4-C5: Minimal disc bulge. Mild facet hypertrophy. No significant stenosis.  C5-C6:  Negative.  C6-C7: Disc space loss with circumferential disc bulge and endplate spurring. Superimposed broad-based right paracentral disc protrusion (series 6, image 22). Spinal stenosis with mild right hemi cord mass effect. Mild to moderate stenosis of the proximal aspect of the right C7 neural foramen. Borderline to mild contralateral left foramina stenosis.  C7-T1: Mild broad-based posterior disc  bulge and facet hypertrophy. No spinal or foraminal stenosis.  Negative visible upper thoracic levels.  IMPRESSION: 1. The symptomatic level with regard to left side radiating pain may be C3-C4, where a small leftward disc protrusion results in mild to moderate left foraminal stenosis and contributes to borderline/mild spinal stenosis. Query Left C4 radiculitis. 2. More advanced disc and endplate degeneration at C6-C7 is eccentric to the right. Subsequent spinal stenosis with mild mass effect on the right hemi cord, and moderate to severe Right C7 foraminal stenosis. No cord signal abnormality.   Electronically Signed   By: Odessa Fleming M.D.   On: 08/21/2018 15:11    PMFS History: Patient Active Problem List   Diagnosis Date Noted  . Former smoker 03/17/2018  . FH: hypertension 03/17/2018  . Polycythemia 09/01/2017  . Obesity (BMI 30.0-34.9) 03/08/2015  . Vitamin D deficiency 08/18/2013  . Medication management 08/07/2013  . Essential hypertension   . Hyperlipidemia, mixed   .  T2_NIDDM   . GERD   . Asthma   . Idiopathic gout   . Testosterone deficiency    Past Medical History:  Diagnosis Date  . Asthma   . Diabetes mellitus without complication (HCC)   . Fatty liver disease, nonalcoholic   . GERD (gastroesophageal reflux disease)   . Gout   . Hyperlipidemia   . Hypertension   . Hypogonadism male   . Obesity   . Venous insufficiency     Family History  Problem Relation Age of Onset  . Diabetes Mother   . Hypertension Mother   . Gout Mother   . COPD Father   . Cancer Sister 20       appendix  . Gout Brother   . Hyperlipidemia Brother   . Hypertension Brother   . Hypertension Brother   . Hyperlipidemia Brother   . Gout Brother   . Diabetes Brother   . COPD Sister     Past Surgical History:  Procedure Laterality Date  . ANTERIOR CRUCIATE LIGAMENT REPAIR Left 2001  . CYSTOSCOPY  1993  . ESOPHAGOGASTRODUODENOSCOPY  1995, 2002   gastritis,  esophagititis  . KNEE ARTHROSCOPY Right 2002, 1995   Social History   Occupational History  . Not on file  Tobacco Use  . Smoking status: Former Smoker    Packs/day: 1.00    Years: 15.00    Pack years: 15.00    Types: Cigarettes    Last attempt to quit: 08/07/2000    Years since quitting: 18.0  . Smokeless tobacco: Never Used  Substance and Sexual Activity  . Alcohol use: Yes    Comment: social  . Drug use: No  . Sexual activity: Not on file

## 2018-09-02 ENCOUNTER — Telehealth (INDEPENDENT_AMBULATORY_CARE_PROVIDER_SITE_OTHER): Payer: Self-pay | Admitting: Orthopaedic Surgery

## 2018-09-02 MED ORDER — GABAPENTIN 300 MG PO CAPS: 300.0000 mg | ORAL_CAPSULE | Freq: Four times a day (QID) | ORAL | 0 refills | Status: DC

## 2018-09-02 MED ORDER — TRAMADOL HCL 50 MG PO TABS: 50.0000 mg | ORAL_TABLET | Freq: Two times a day (BID) | ORAL | 0 refills | Status: DC | PRN

## 2018-09-02 NOTE — Telephone Encounter (Signed)
Medication refill Gabapentin  Hydrocodone   Patient called to check on the status of his medication refill he left a message in his my chart. I did send a previous message through that my chart I'm unaware of that method and wanted to make sure the message is received.

## 2018-09-02 NOTE — Telephone Encounter (Signed)
Patient called to check on the status of his medication refill

## 2018-09-02 NOTE — Telephone Encounter (Signed)
I called both scripts in to pharmacy. Patient advised.

## 2018-09-02 NOTE — Telephone Encounter (Signed)
I called patient and advised. OK for Gabapentin 300mg  1 po qid #120 and Ultram 50mg  1 po bid #40 per Dr. Ophelia Charter. Called to patient's pharmacy.

## 2018-09-03 ENCOUNTER — Encounter (INDEPENDENT_AMBULATORY_CARE_PROVIDER_SITE_OTHER): Payer: Self-pay | Admitting: Orthopaedic Surgery

## 2018-09-05 ENCOUNTER — Encounter: Payer: Self-pay | Admitting: Internal Medicine

## 2018-09-05 NOTE — Patient Instructions (Addendum)
  Start Novolin 70/30      15 units with Breakfast  & 10 units at supper   Call my cell [phone]  if you have questions   ++++++++++++++++++++++++++++++++++++++++++++++++++++++++++++ Recommend the book "The END of DIETING" by Dr Monico Hoar   & the book "The END of DIABETES" by Dr Monico Hoar  At [email or web address] - get book & Audio CD's     Being diabetic has a  300% increased risk for heart attack, stroke, cancer, and alzheimer- type vascular dementia. It is very important that you work harder with diet by avoiding all foods that are white. Avoid white rice (brown & wild rice is OK), white potatoes (sweetpotatoes in moderation is OK), White bread or wheat bread or anything made out of white flour like bagels, donuts, rolls, buns, biscuits, cakes, pastries, cookies, pizza crust, and pasta (made from white flour & egg whites) - vegetarian pasta or spinach or wheat pasta is OK. Multigrain breads like Arnold's or Pepper[employer], or multigrain sandwich thins or flatbreads.  Diet, exercise and weight loss can reverse and cure diabetes in the early stages.  Diet, exercise and weight loss is very important in the control and prevention of complications of diabetes which affects every system in your body, ie. Brain - dementia/stroke, eyes - glaucoma/blindness, heart - heart attack/heart failure, kidneys - dialysis, stomach - gastric paralysis, intestines - malabsorption, nerves - severe painful neuritis, circulation - gangrene & loss of a leg(s), and finally cancer and Alzheimers.    I recommend avoid fried & greasy foods,  sweets/candy, white rice (brown or wild rice or Quinoa is OK), white potatoes (sweet potatoes are OK) - anything made from white flour - bagels, doughnuts, rolls, buns, biscuits,white and wheat breads, pizza crust and traditional pasta made of white flour & egg white(vegetarian pasta or spinach or wheat pasta is OK).  Multi-grain bread is OK - like multi-grain flat bread or sandwich  thins. Avoid alcohol in excess. Exercise is also important.    Eat all the vegetables you want - avoid meat, especially red meat and dairy - especially cheese.  Cheese is the most concentrated form of trans-fats which is the worst thing to clog up our arteries. Veggie cheese is OK which can be found in the fresh produce section at [facility] or [employer] or [employer]  ++++++++++++++++++++++++++

## 2018-09-05 NOTE — Progress Notes (Signed)
   Subjective:    Patient ID: Shaun [name], male    DOB: Jan 15, 1966, 53 y.o.   MRN: [id number]  HPI   This nice [name] with HTN, HLD, T2_DM is also followed by Dr Ophelia Charter anticipating Cx C3/C4 Fusion which currently is prudently deferred by Dr Ophelia Charter in consideration of patient's last A1c 10.4% at his Nov OV. Dr Ophelia Charter defers surgery until A1c <8.0% & preferably below 7.5%. Patient reports recently treated wit Prednisone pulse/taper an exacerbated glucoses up to the 400-500's.     Patient's T2_DM predates since 2004 and historically compliance has been poor as reflected by his elevated A1c's and obesity.    Wt Readings from Last 3 Encounters:  08/31/18 225 lb (102.1 kg)  08/17/18 230 lb (104.3 kg)  08/11/18 230 lb (104.3 kg)   Weight 237# in Oct 2018.   Medication Sig  . aspirin 81 MG tablet Take 81 mg by mouth daily.  Marland Kitchen atenolol  100 MG tablet TAKE 1 TABLET(100 MG) BY MOUTH DAILY  . VITAMIN D 2000 units  Take 4 capsules by mouth daily.   . Cinnamon 500 MG capsule Take 500 mg by mouth 4 (four) times daily.  . fenofibrate 160 MG tablet TAKE 1 TABLET(160 MG) BY MOUTH DAILY  . gabapentin 300 MG capsule Take 1 capsule (300 mg total) by mouth 4 (four) times daily.  Marland Kitchen glyBURIDE  5 MG tablet TAKE 1 TABLET BY MOUTH THREE TIMES DAILY WITH MEALS FOR DIABETES  . HYDROcodone-APAP 10-325  Take 1 tablet by mouth every 6 (six) hours as needed.  . metFORMIN-XR 500 MG  TAKE 2 TABLETS  TWICE DAILY WITH FOOD  . traMADol  50 MG tablet Take 1 tablet  2 (two) times daily as needed.   Allergies  Allergen Reactions  . Zocor [Simvastatin]     Headache  . Zoloft [Sertraline Hcl]     dysphona   Review of Systems     10 point systems review negative except as above.    Objective:   Physical Exam  BP 128/78   Pulse 73   Temp (!) 97.2 F (36.2 C)   Ht 5' 10.5" (1.791 m)   Wt 226 lb 3.2 oz (102.6 kg)   SpO2 97%   BMI 32.00 kg/m   HEENT - WNL. Neck - supple.  Chest - Clear equal BS. Cor - Nl HS. RRR  w/o sig MGR. PP 1(+). No edema. MS- FROM w/o deformities.  Gait Nl. Neuro -  Nl w/o focal abnormalities.    Assessment & Plan:   1. Uncontrolled type 2 diabetes mellitus with hyperglycemia (HCC)  - Fructosamine - COMPLETE METABOLIC PANEL WITH GFR  - Recc start Novolin 70/30 flex pens #5 & start 15 units qam & 10 u qpm  - Recc schedule NV for insulin instructions & patient to return 1 week later . Gave patient my cell # to call for questions.   2. Dysuria  - Urinalysis, Routine w reflex microscopic - Urine Culture  3. Medication management  - Fructosamine - COMPLETE METABOLIC PANEL WITH GFR

## 2018-09-06 ENCOUNTER — Encounter: Payer: Self-pay | Admitting: Internal Medicine

## 2018-09-06 ENCOUNTER — Ambulatory Visit (INDEPENDENT_AMBULATORY_CARE_PROVIDER_SITE_OTHER): Payer: BLUE CROSS/BLUE SHIELD | Admitting: Internal Medicine

## 2018-09-06 VITALS — BP 128/78 | HR 73 | Temp 97.2°F | Ht 70.5 in | Wt 226.2 lb

## 2018-09-06 DIAGNOSIS — Z79899 Other long term (current) drug therapy: Secondary | ICD-10-CM | POA: Diagnosis not present

## 2018-09-06 DIAGNOSIS — R300 Dysuria: Secondary | ICD-10-CM | POA: Diagnosis not present

## 2018-09-06 DIAGNOSIS — N390 Urinary tract infection, site not specified: Secondary | ICD-10-CM | POA: Diagnosis not present

## 2018-09-06 DIAGNOSIS — E1165 Type 2 diabetes mellitus with hyperglycemia: Secondary | ICD-10-CM

## 2018-09-07 ENCOUNTER — Ambulatory Visit (INDEPENDENT_AMBULATORY_CARE_PROVIDER_SITE_OTHER): Payer: BLUE CROSS/BLUE SHIELD

## 2018-09-07 ENCOUNTER — Ambulatory Visit (INDEPENDENT_AMBULATORY_CARE_PROVIDER_SITE_OTHER): Payer: BLUE CROSS/BLUE SHIELD | Admitting: Orthopaedic Surgery

## 2018-09-07 ENCOUNTER — Other Ambulatory Visit: Payer: Self-pay | Admitting: Internal Medicine

## 2018-09-07 VITALS — BP 124/80 | HR 88 | Temp 97.5°F | Ht 70.5 in | Wt 224.2 lb

## 2018-09-07 DIAGNOSIS — Z79899 Other long term (current) drug therapy: Secondary | ICD-10-CM

## 2018-09-07 DIAGNOSIS — N410 Acute prostatitis: Secondary | ICD-10-CM

## 2018-09-07 MED ORDER — AMOXICILLIN 250 MG PO CAPS: ORAL_CAPSULE | ORAL | 0 refills | Status: DC

## 2018-09-07 NOTE — Progress Notes (Signed)
Pt reports here in office for instruction on how to use an insulin pen. The instruction were given to patient and the patient also demonstrated the use of the pen. Patient was also told that if he had any other concerns or wasn't sure on how to use the pen to please call the office for help. Patient agreed and voiced understanding. Vitals were entered into epic as well

## 2018-09-08 ENCOUNTER — Other Ambulatory Visit: Payer: Self-pay | Admitting: Physician Assistant

## 2018-09-08 ENCOUNTER — Other Ambulatory Visit: Payer: Self-pay | Admitting: Internal Medicine

## 2018-09-08 ENCOUNTER — Ambulatory Visit: Payer: Self-pay

## 2018-09-09 LAB — COMPLETE METABOLIC PANEL WITH GFR
AG Ratio: 1.8 (calc) (ref 1.0–2.5)
ALT: 38 U/L (ref 9–46)
AST: 31 U/L (ref 10–35)
Albumin: 4.3 g/dL (ref 3.6–5.1)
Alkaline phosphatase (APISO): 57 U/L (ref 35–144)
BUN/Creatinine Ratio: 28 (calc) — ABNORMAL HIGH (ref 6–22)
BUN: 19 mg/dL (ref 7–25)
CO2: 29 mmol/L (ref 20–32)
Calcium: 9.9 mg/dL (ref 8.6–10.3)
Chloride: 99 mmol/L (ref 98–110)
Creat: 0.69 mg/dL — ABNORMAL LOW (ref 0.70–1.33)
GFR, Est African American: 126 mL/min/{1.73_m2} (ref >=60)
GFR, Est Non African American: 108 mL/min/{1.73_m2} (ref >=60)
Globulin: 2.4 g/dL (calc) (ref 1.9–3.7)
Glucose, Bld: 274 mg/dL — ABNORMAL HIGH (ref 65–99)
Potassium: 4.7 mmol/L (ref 3.5–5.3)
Sodium: 137 mmol/L (ref 135–146)
Total Bilirubin: 0.5 mg/dL (ref 0.2–1.2)
Total Protein: 6.7 g/dL (ref 6.1–8.1)

## 2018-09-09 LAB — URINALYSIS, ROUTINE W REFLEX MICROSCOPIC
Bilirubin Urine: NEGATIVE
Hgb urine dipstick: NEGATIVE
Hyaline Cast: NONE SEEN /LPF
Ketones, ur: NEGATIVE
Nitrite: NEGATIVE
Protein, ur: NEGATIVE
RBC / HPF: NONE SEEN /HPF (ref 0–2)
Specific Gravity, Urine: 1.033 (ref 1.001–1.03)
Squamous Epithelial / HPF: NONE SEEN /HPF (ref <=5)
pH: 5.5 (ref 5.0–8.0)

## 2018-09-09 LAB — URINE CULTURE
MICRO NUMBER:: 142949
SPECIMEN QUALITY:: ADEQUATE

## 2018-09-09 LAB — FRUCTOSAMINE: Fructosamine: 391 umol/L — ABNORMAL HIGH (ref 205–285)

## 2018-09-12 ENCOUNTER — Other Ambulatory Visit: Payer: Self-pay | Admitting: Internal Medicine

## 2018-09-19 NOTE — Patient Instructions (Signed)

## 2018-09-19 NOTE — Progress Notes (Signed)
   Subjective:    Patient ID: Shaun [name], male    DOB: 01/27/1966, 53 y.o.   MRN: [id number]  HPI     Patient is a 53 yo [name] with T2_IDDM returning for short f/u after recently starting Insulin to facilitate better control of his Diabetes. He's also followed for HTN, HLD, Gout and Vitamin D Deficiency    Last Fructosamine 391 mg%  (Nl 205-285 mg%) 2 weeks ago & patient was started on Novolin 70/30 bid at dose 20 u qam & 15 u qpm . Reviewed list of BID glucoses with CBG's mostly in the 200's. No  hypoglycemia or suspect sx's.   Medication Sig  . amoxicillin 250 MG caps Take 1 capsule 3 x /day with a meal for infection  . aspirin 81 MG tablet Take 81 mg by mouth daily.  Marland Kitchen atenolol 100 MG tablet TAKE 1 TABLET(100 MG) BY MOUTH DAILY  . VITAMIN D 2000 units  Take 4 capsules by mouth daily.   . Cinnamon 500 MG capsule Take 500 mg by mouth 4 (four) times daily.  . fenofibrate 160 MG tablet TAKE 1 TABLET DAILY  . gabapentin (NEURONTIN) 300 MG capsule Take 1 capsule  4 x /daily. as needed. )  . NORCO 10-325 MG tablet Take 1 tablet  every 6 (six) hours as needed.  . metFORMIN (GLUCOPHAGE-XR) 500 MG 24 hr tablet TAKE 2 TABLETS  TWICE DAILY WITH FOOD  . traMADol  50 MG tablet Take 1 tablet  2 (two) times daily as needed.    Allergies  Allergen Reactions  . Zocor [Simvastatin]     Headache  . Zoloft [Sertraline Hcl]     dysphona   Past Medical History:  Diagnosis Date  . Asthma   . Diabetes mellitus without complication (HCC)   . Fatty liver disease, nonalcoholic   . GERD (gastroesophageal reflux disease)   . Gout   . Hyperlipidemia   . Hypertension   . Hypogonadism male   . Obesity   . Venous insufficiency    Review of Systems   10 point systems review negative except as above.    Objective:   Physical Exam  BP (!) 140/98   Pulse 60   Temp (!) 97.1 F (36.2 C)   Resp 16   Ht 5' 10.5" (1.791 m)   Wt 229 lb 9.6 oz (104.1 kg)   BMI 32.48 kg/m   HEENT - WNL. Neck -  supple.  Chest - Clear equal BS. Cor - Nl HS. RRR w/o sig MGR. PP 1(+). No edema. MS- FROM w/o deformities.  Gait Nl. Neuro -  Nl w/o focal abnormalities.    Assessment & Plan:   1. Uncontrolled type 2 diabetes mellitus with hyperglycemia (HCC)  - Recc increase Nov 70/30 to 25 units qam and 20 u qpm  - Fructosamine  2. Essential hypertension  - olmesartan (BENICAR) 40 MG tablet; Take 1/2 to 1 tablet at night for BP  Dispense: 90 tablet; Refill: 3  3. Hyperlipidemia, mixed  4. Urinary tract infection   - recently tx/d w/ Amoxil - Urinalysis, Routine w reflex microscopic - Urine Culture  5. Medication management  - discussed meds, SE's, diet, Exercise & ROV 3 weeks to reassess

## 2018-09-20 ENCOUNTER — Encounter: Payer: Self-pay | Admitting: Internal Medicine

## 2018-09-20 ENCOUNTER — Ambulatory Visit (INDEPENDENT_AMBULATORY_CARE_PROVIDER_SITE_OTHER): Payer: BLUE CROSS/BLUE SHIELD | Admitting: Internal Medicine

## 2018-09-20 VITALS — BP 140/98 | HR 60 | Temp 97.1°F | Resp 16 | Ht 70.5 in | Wt 229.6 lb

## 2018-09-20 DIAGNOSIS — E1165 Type 2 diabetes mellitus with hyperglycemia: Secondary | ICD-10-CM

## 2018-09-20 DIAGNOSIS — I10 Essential (primary) hypertension: Secondary | ICD-10-CM

## 2018-09-20 DIAGNOSIS — E782 Mixed hyperlipidemia: Secondary | ICD-10-CM | POA: Diagnosis not present

## 2018-09-20 DIAGNOSIS — Z79899 Other long term (current) drug therapy: Secondary | ICD-10-CM

## 2018-09-20 DIAGNOSIS — N390 Urinary tract infection, site not specified: Secondary | ICD-10-CM | POA: Diagnosis not present

## 2018-09-20 MED ORDER — OLMESARTAN MEDOXOMIL 40 MG PO TABS: ORAL_TABLET | ORAL | 3 refills | Status: DC

## 2018-09-23 LAB — URINE CULTURE
MICRO NUMBER:: 205023
Result:: NO GROWTH
SPECIMEN QUALITY:: ADEQUATE

## 2018-09-23 LAB — URINALYSIS, ROUTINE W REFLEX MICROSCOPIC
Bilirubin Urine: NEGATIVE
Hgb urine dipstick: NEGATIVE
Ketones, ur: NEGATIVE
Leukocytes,Ua: NEGATIVE
Nitrite: NEGATIVE
Protein, ur: NEGATIVE
Specific Gravity, Urine: 1.007 (ref 1.001–1.03)
pH: 7 (ref 5.0–8.0)

## 2018-09-23 LAB — FRUCTOSAMINE: Fructosamine: 347 umol/L — ABNORMAL HIGH (ref 205–285)

## 2018-10-05 ENCOUNTER — Ambulatory Visit: Payer: Self-pay | Admitting: Physician Assistant

## 2018-10-05 ENCOUNTER — Ambulatory Visit: Payer: Self-pay | Admitting: Adult Health Nurse Practitioner

## 2018-10-11 ENCOUNTER — Ambulatory Visit: Payer: Self-pay | Admitting: Internal Medicine

## 2018-10-11 ENCOUNTER — Encounter: Payer: Self-pay | Admitting: Internal Medicine

## 2018-10-11 NOTE — Progress Notes (Signed)
Subjective:    Patient ID: Shaun [name], male    DOB: September 01, 1965, 53 y.o.   MRN: [id number]  HPI        Patient is a 53 yo [name] with T2_IDDM returns for regulation of his insulin dosing for better control to allow him to proceed with  Cx DDD surgery. Last A1c in Nov was 10.4%.  Patient was started on Novolin  70/30 about a month ago and last visit his dosing was increased to 25 units qam and 20 units qpm. He reports am FBG's are ranging betw 130-170 mg% & average about 150 mg% and pm CBG's before supper range 120-170 mg% again average about 150 mg%. He reports he feels bad if his sugars run less than 140 mg%.       Also BP was high and Olmesartan was added to his regimen of Atenolol. Last month he was also treated with Amoxil for a Strep UTI.   Medication Sig  . amoxicillin  250 MG  Take 1 capsule 3 x /day with a meal for infection  . aspirin 81 MG tablet Take  daily.  Marland Kitchen atenolol  100 MG tablet TAKE 1 TAB DAILY  . VITAMIN D 2000 units  Take 4 capsules  daily.   . Cinnamon 500 MG capsule Take  4 times daily.  . fenofibrate 160 MG tablet TAKE 1 TAB DAILY  . gabapentin  300 MG capsule Patient taking differently - 300 mg  as needed.  Marland Kitchen HYDROcodone-apap 10-325 MG  Take 1 tablet  every 6 hours as needed.  . metFORMIN-XR 500 MG  TAKE 2 TABs TWICE DAILY WITH FOOD  . olmesartan 40 MG tablet Take 1/2 to 1 tablet at night for BP  . traMADol 50 MG tablet Take 1 tablet  2  times daily as needed.   Allergies  Allergen Reactions  . Zocor [Simvastatin]     Headache  . Zoloft [Sertraline Hcl]     dysphona   Past Medical History:  Diagnosis Date  . Asthma   . Diabetes mellitus without complication (HCC)   . Fatty liver disease, nonalcoholic   . GERD (gastroesophageal reflux disease)   . Gout   . Hyperlipidemia   . Hypertension   . Hypogonadism male   . Obesity   . Venous insufficiency    Past Surgical History:  Procedure Laterality Date  . ANTERIOR CRUCIATE LIGAMENT REPAIR Left 2001  .  CYSTOSCOPY  1993  . ESOPHAGOGASTRODUODENOSCOPY  1995, 2002   gastritis, esophagititis  . KNEE ARTHROSCOPY Right 2002, 1995     Review of Systems   10 point systems review negative except as above.    Objective:   Physical Exam  BP 140/90   Pulse 72   Temp 97.8 F (36.6 C)   Resp 16   Ht 5' 10.5" (1.791 m)   Wt 229 lb 6.4 oz (104.1 kg)   BMI 32.45 kg/m   HEENT - WNL. Neck - supple.  Chest - Clear equal BS. Cor - Nl HS. RRR w/o sig MGR. PP 1(+). No edema. MS- FROM w/o deformities.  Gait Nl. Neuro -  Nl w/o focal abnormalities.    Assessment & Plan:   1. Essential hypertension  - CBC with Differential/Platelet - COMPLETE METABOLIC PANEL WITH GFR - TSH - Magnesium  2. Hyperlipidemia, mixed  - Lipid panel - TSH  3. Uncontrolled type 2 diabetes mellitus with hyperglycemia (HCC)  -  Advised to add 2 units of insulin to  am & pm doses of insulin til CBG's level off about or less than 130 mg%.   - Hemoglobin A1c  4. Urinary tract infection   - Urinalysis, Routine w reflex microscopic - Urine Culture  5. Vitamin D deficiency  - VITAMIN D 25 Hydroxy  6. Medication management  - CBC with Differential/Platelet - COMPLETE METABOLIC PANEL WITH GFR - Lipid panel - TSH - Hemoglobin A1c - VITAMIN D 25 Hydroxy - Urinalysis, Routine w reflex microscopic - Urine Culture - Magnesium

## 2018-10-11 NOTE — Patient Instructions (Signed)

## 2018-10-12 ENCOUNTER — Ambulatory Visit: Payer: BLUE CROSS/BLUE SHIELD | Admitting: Internal Medicine

## 2018-10-12 ENCOUNTER — Encounter: Payer: Self-pay | Admitting: Internal Medicine

## 2018-10-12 VITALS — BP 140/90 | HR 72 | Temp 97.8°F | Resp 16 | Ht 70.5 in | Wt 229.4 lb

## 2018-10-12 DIAGNOSIS — E782 Mixed hyperlipidemia: Secondary | ICD-10-CM | POA: Diagnosis not present

## 2018-10-12 DIAGNOSIS — I10 Essential (primary) hypertension: Secondary | ICD-10-CM | POA: Diagnosis not present

## 2018-10-12 DIAGNOSIS — Z79899 Other long term (current) drug therapy: Secondary | ICD-10-CM

## 2018-10-12 DIAGNOSIS — N390 Urinary tract infection, site not specified: Secondary | ICD-10-CM | POA: Diagnosis not present

## 2018-10-12 DIAGNOSIS — E559 Vitamin D deficiency, unspecified: Secondary | ICD-10-CM

## 2018-10-12 DIAGNOSIS — E1165 Type 2 diabetes mellitus with hyperglycemia: Secondary | ICD-10-CM

## 2018-10-14 ENCOUNTER — Other Ambulatory Visit (INDEPENDENT_AMBULATORY_CARE_PROVIDER_SITE_OTHER): Payer: Self-pay | Admitting: Orthopaedic Surgery

## 2018-10-14 LAB — CBC WITH DIFFERENTIAL/PLATELET
Absolute Monocytes: 449 cells/uL (ref 200–950)
Basophils Absolute: 27 cells/uL (ref 0–200)
Basophils Relative: 0.4 %
Eosinophils Absolute: 218 cells/uL (ref 15–500)
Eosinophils Relative: 3.2 %
HCT: 45.5 % (ref 38.5–50.0)
Hemoglobin: 15.7 g/dL (ref 13.2–17.1)
Lymphs Abs: 3312 cells/uL (ref 850–3900)
MCH: 28.9 pg (ref 27.0–33.0)
MCHC: 34.5 g/dL (ref 32.0–36.0)
MCV: 83.8 fL (ref 80.0–100.0)
MPV: 10.5 fL (ref 7.5–12.5)
Monocytes Relative: 6.6 %
Neutro Abs: 2795 cells/uL (ref 1500–7800)
Neutrophils Relative %: 41.1 %
Platelets: 261 10*3/uL (ref 140–400)
RBC: 5.43 10*6/uL (ref 4.20–5.80)
RDW: 13.1 % (ref 11.0–15.0)
Total Lymphocyte: 48.7 %
WBC: 6.8 10*3/uL (ref 3.8–10.8)

## 2018-10-14 LAB — VITAMIN D 25 HYDROXY (VIT D DEFICIENCY, FRACTURES): Vit D, 25-Hydroxy: 70 ng/mL (ref 30–100)

## 2018-10-14 LAB — COMPLETE METABOLIC PANEL WITH GFR
AG Ratio: 1.7 (calc) (ref 1.0–2.5)
ALT: 34 U/L (ref 9–46)
AST: 30 U/L (ref 10–35)
Albumin: 4.5 g/dL (ref 3.6–5.1)
Alkaline phosphatase (APISO): 50 U/L (ref 35–144)
BUN: 20 mg/dL (ref 7–25)
CO2: 28 mmol/L (ref 20–32)
Calcium: 10.2 mg/dL (ref 8.6–10.3)
Chloride: 99 mmol/L (ref 98–110)
Creat: 0.86 mg/dL (ref 0.70–1.33)
GFR, Est African American: 115 mL/min/{1.73_m2} (ref >=60)
GFR, Est Non African American: 99 mL/min/{1.73_m2} (ref >=60)
Globulin: 2.6 g/dL (calc) (ref 1.9–3.7)
Glucose, Bld: 269 mg/dL — ABNORMAL HIGH (ref 65–99)
Potassium: 4.7 mmol/L (ref 3.5–5.3)
Sodium: 136 mmol/L (ref 135–146)
Total Bilirubin: 0.6 mg/dL (ref 0.2–1.2)
Total Protein: 7.1 g/dL (ref 6.1–8.1)

## 2018-10-14 LAB — URINE CULTURE
MICRO NUMBER:: 301234
Result:: NO GROWTH
SPECIMEN QUALITY:: ADEQUATE

## 2018-10-14 LAB — URINALYSIS, ROUTINE W REFLEX MICROSCOPIC
Bilirubin Urine: NEGATIVE
Hgb urine dipstick: NEGATIVE
Ketones, ur: NEGATIVE
Leukocytes,Ua: NEGATIVE
Nitrite: NEGATIVE
Protein, ur: NEGATIVE
Specific Gravity, Urine: 1.016 (ref 1.001–1.03)
pH: 6.5 (ref 5.0–8.0)

## 2018-10-14 LAB — LIPID PANEL
Cholesterol: 185 mg/dL (ref <200)
HDL: 27 mg/dL — ABNORMAL LOW (ref >=40)
LDL Cholesterol (Calc): 119 mg/dL (calc) — ABNORMAL HIGH
Non-HDL Cholesterol (Calc): 158 mg/dL (calc) — ABNORMAL HIGH (ref <130)
Total CHOL/HDL Ratio: 6.9 (calc) — ABNORMAL HIGH (ref <5.0)
Triglycerides: 293 mg/dL — ABNORMAL HIGH (ref <150)

## 2018-10-14 LAB — HEMOGLOBIN A1C
Hgb A1c MFr Bld: 10.8 % of total Hgb — ABNORMAL HIGH (ref <5.7)
Mean Plasma Glucose: 263 (calc)
eAG (mmol/L): 14.6 (calc)

## 2018-10-14 LAB — MAGNESIUM: Magnesium: 1.8 mg/dL (ref 1.5–2.5)

## 2018-10-14 LAB — TSH: TSH: 2.08 mIU/L (ref 0.40–4.50)

## 2018-10-14 MED ORDER — TRAMADOL HCL 50 MG PO TABS: 50.0000 mg | ORAL_TABLET | Freq: Two times a day (BID) | ORAL | 0 refills | Status: DC | PRN

## 2018-10-14 MED ORDER — GABAPENTIN 300 MG PO CAPS: 300.0000 mg | ORAL_CAPSULE | Freq: Four times a day (QID) | ORAL | 0 refills | Status: DC

## 2018-10-14 NOTE — Telephone Encounter (Signed)
Shaun [name] patient

## 2018-10-14 NOTE — Telephone Encounter (Signed)
Refill of Gabapentin and Tramadol #20 called to pharmacy. Hydrocodone denied per Dr. Ophelia Charter until surgery.

## 2018-10-14 NOTE — Telephone Encounter (Signed)
Ok refill neurontin.   OK for tramadol # 20  One po bid , this is last Rx for pain until surgery ucall thanks

## 2018-10-14 NOTE — Telephone Encounter (Signed)
Please advise 

## 2018-10-14 NOTE — Telephone Encounter (Signed)
[place] patient

## 2018-10-15 ENCOUNTER — Other Ambulatory Visit: Payer: Self-pay | Admitting: Internal Medicine

## 2018-10-19 ENCOUNTER — Encounter (INDEPENDENT_AMBULATORY_CARE_PROVIDER_SITE_OTHER): Payer: Self-pay | Admitting: Orthopaedic Surgery

## 2018-11-15 ENCOUNTER — Other Ambulatory Visit (INDEPENDENT_AMBULATORY_CARE_PROVIDER_SITE_OTHER): Payer: Self-pay | Admitting: Orthopaedic Surgery

## 2018-11-16 NOTE — Telephone Encounter (Signed)
Please advise 

## 2018-12-05 ENCOUNTER — Other Ambulatory Visit: Payer: Self-pay | Admitting: Physician Assistant

## 2018-12-23 ENCOUNTER — Other Ambulatory Visit (INDEPENDENT_AMBULATORY_CARE_PROVIDER_SITE_OTHER): Payer: Self-pay | Admitting: Orthopaedic Surgery

## 2018-12-23 NOTE — Telephone Encounter (Signed)
Please advise 

## 2019-01-05 NOTE — Patient Instructions (Signed)

## 2019-01-05 NOTE — Progress Notes (Signed)
History of Present Illness:       This very nice 53 y.o. Shaun [name] presents for 6 month follow up with HTN, HLD, T2_NIDDM and Vitamin D Deficiency. Patient is on Testosterone (gel) replacement with improved stamina and sense of well-being.  Patient has remote hx/o Gout (& is off meds).      Patient is treated for HTN (1999)  & BP has been controlled at home. Today's BP is at goal - 124/76. Patient has had no complaints of any cardiac type chest pain, palpitations, dyspnea / orthopnea / PND, dizziness, claudication, or dependent edema.      Hyperlipidemia is not controlled with non-compliant diet & meds. Patient denies myalgias or other med SE's. Last Lipids were not at goal: Lab Results  Component Value Date   CHOL 185 10/12/2018   HDL 27 (L) 10/12/2018   LDLCALC 119 (H) 10/12/2018   TRIG 293 (H) 10/12/2018   CHOLHDL 6.9 (H) 10/12/2018       Also, the patient has Morbid Obesity (BMI 34+) and history of  poorly compliant & poorly controlled T2_DM (2004). Patient admits sporadically taking Novolin 70/30 60-65 units qam and 50 -55 units qpm.  and has had no symptoms of reactive hypoglycemia, diabetic polys, paresthesias or visual blurring.  Patient freely admits compulsive overeating and Gluttony and last A1c was not at goal: Lab Results  Component Value Date   HGBA1C 10.8 (H) 10/12/2018      Further, the patient also has history of Vitamin D Deficiency ("15" / 2009)  and supplements vitamin D without any suspected side-effects. Last vitamin D was at goal: Lab Results  Component Value Date   VD25OH 65 10/12/2018   Current Outpatient Medications on File Prior to Visit  Medication Sig  . ANDROGEL PUMP Shaun.25 MG/ACT (1.62%) GEL APPLY 2 PUMPS ON EACH ARM (4 TOTAL PUMPS) DAILY  . aspirin 81 MG tablet Take 81 mg by mouth daily.  Marland Kitchen atenolol (TENORMIN) 100 MG tablet TAKE 1 TABLET(100 MG) BY MOUTH DAILY  . Blood Glucose Monitoring Suppl (CONTOUR NEXT MONITOR) w/Device KIT 1 Device by Does not apply  route 3 (three) times daily.  . Cholecalciferol (VITAMIN D) 2000 units CAPS Take 4 capsules by mouth daily.   . Cinnamon 500 MG capsule Take 500 mg by mouth 4 (four) times daily.  . fenofibrate 160 MG tablet TAKE 1 TABLET(160 MG) BY MOUTH DAILY  . gabapentin (NEURONTIN) 300 MG capsule TAKE 1 CAPSULE BY MOUTH FOUR TIMES DAILY  . glucose blood (CONTOUR NEXT TEST) test strip Test blood sugar up to three times a day  . HYDROcodone-acetaminophen (NORCO) 10-325 MG tablet Take 1 tablet by mouth every 6 (six) hours as needed.  . metFORMIN (GLUCOPHAGE-XR) 500 MG 24 hr tablet Take 2 tablets 2 x /Day with Food for Diabetes  . MICROLET LANCETS MISC USE TO TEST UP TO THREE TIMES DAILY AS DIRECTED  . olmesartan (BENICAR) 40 MG tablet Take 1/2 to 1 tablet at night for BP  . OVER THE COUNTER MEDICATION Novolin N 70/30-- injects 35 units in the AM and 30 units in the PM.   No current facility-administered medications on file prior to visit.    Allergies  Allergen Reactions  . Zocor [Simvastatin]     Headache  . Zoloft [Sertraline Hcl]     dysphona   PMHx:   Past Medical History:  Diagnosis Date  . Asthma   . Diabetes mellitus without complication ([place])   . Fatty liver disease, nonalcoholic   .  GERD (gastroesophageal reflux disease)   . Gout   . Hyperlipidemia   . Hypertension   . Hypogonadism male   . Obesity   . Venous insufficiency    Immunization History  Administered Date(s) Administered  . PPD Test 02/17/2014, 03/08/2015, 03/27/2016, 05/13/2017, 06/21/2018  . Pneumococcal-Unspecified 02/09/2013  . Tdap 02/09/2013   Past Surgical History:  Procedure Laterality Date  . ANTERIOR CRUCIATE LIGAMENT REPAIR Left 2001  . CYSTOSCOPY  1993  . ESOPHAGOGASTRODUODENOSCOPY  1995, 2002   gastritis, esophagititis  . KNEE ARTHROSCOPY Right 2002, 1995    FHx:    Reviewed / unchanged  SHx:    Reviewed / unchanged   Systems Review:  Constitutional: Denies fever, chills, wt changes, headaches,  insomnia, fatigue, night sweats, change in appetite. Eyes: Denies redness, blurred vision, diplopia, discharge, itchy, watery eyes.  ENT: Denies discharge, congestion, post nasal drip, epistaxis, sore throat, earache, hearing loss, dental pain, tinnitus, vertigo, sinus pain, snoring.  CV: Denies chest pain, palpitations, irregular heartbeat, syncope, dyspnea, diaphoresis, orthopnea, PND, claudication or edema. Respiratory: denies cough, dyspnea, DOE, pleurisy, hoarseness, laryngitis, wheezing.  Gastrointestinal: Denies dysphagia, odynophagia, heartburn, reflux, water brash, abdominal pain or cramps, nausea, vomiting, bloating, diarrhea, constipation, hematemesis, melena, hematochezia  or hemorrhoids. Genitourinary: Denies dysuria, frequency, urgency, nocturia, hesitancy, discharge, hematuria or flank pain. Musculoskeletal: Denies arthralgias, myalgias, stiffness, jt. swelling, pain, limping or strain/sprain.  Skin: Denies pruritus, rash, hives, warts, acne, eczema or change in skin lesion(s). Neuro: No weakness, tremor, incoordination, spasms, paresthesia or pain. Psychiatric: Denies confusion, memory loss or sensory loss. Endo: Denies change in weight, skin or hair change.  Heme/Lymph: No excessive bleeding, bruising or enlarged lymph nodes.  Physical Exam  BP 124/76   Pulse 72   Temp (!) 97.4 F (36.3 C)   Resp 16   Ht 5' 10.5" (1.791 m)   Wt 243 lb 3.2 oz (110.3 kg)   BMI 34.40 kg/m   Appears  well nourished, well groomed  and in no distress.  Eyes: PERRLA, EOMs, conjunctiva no swelling or erythema. Sinuses: No frontal/maxillary tenderness ENT/Mouth: EAC's clear, TM's nl w/o erythema, bulging. Nares clear w/o erythema, swelling, exudates. Oropharynx clear without erythema or exudates. Oral hygiene is good. Tongue normal, non obstructing. Hearing intact.  Neck: Supple. Thyroid not palpable. Car 2+/2+ without bruits, nodes or JVD. Chest: Respirations nl with BS clear & equal w/o  rales, rhonchi, wheezing or stridor.  Cor: Heart sounds normal w/ regular rate and rhythm without sig. murmurs, gallops, clicks or rubs. Peripheral pulses normal and equal  without edema.  Abdomen: Soft & bowel sounds normal. Non-tender w/o guarding, rebound, hernias, masses or organomegaly.  Lymphatics: Unremarkable.  Musculoskeletal: Full ROM all peripheral extremities, joint stability, 5/5 strength and normal gait.  Skin: Warm, dry without exposed rashes, lesions or ecchymosis apparent.  Neuro: Cranial nerves intact, reflexes equal bilaterally. Sensory-motor testing grossly intact. Tendon reflexes grossly intact.  Pysch: Alert & oriented x 3.  Insight and judgement nl & appropriate. No ideations.  Assessment and Plan:  1. Essential hypertension  - Continue medication, monitor blood pressure at home.  - Continue DASH diet.  Reminder to go to the ER if any CP,  SOB, nausea, dizziness, severe HA, changes vision/speech.  - CBC with Differential/Platelet - COMPLETE METABOLIC PANEL WITH GFR - Magnesium - TSH  2. Hyperlipidemia, mixed  - Continue diet/meds, exercise,& lifestyle modifications.  - Continue monitor periodic cholesterol/liver & renal functions   - Lipid panel - TSH  3. Poorly controlled type  2 diabetes mellitus ([place])  - Continue diet, exercise  - Lifestyle modifications.  - Monitor appropriate labs.  - Hemoglobin A1c - Insulin, random  4. Vitamin D deficiency  - Continue supplementation.  - VITAMIN D 25 Hydroxyl  5. GERD  - CBC with Differential/Platelet  6. Idiopathic gout, history  - Uric acid  7. Testosterone deficiency  - Testosterone  8. Obesity (BMI 30.0-34.9)  - phentermine (ADIPEX-P) 37.5 MG tablet; Take 1/2 to 1 tablet every Morning for Dieting & Weight Loss  Dispense: 90 tablet; Refill: 1  - topiramate (TOPAMAX) 50 MG tablet; Take 1/2 to 1 tablet 2 x /day at Suppertime & Bedtime for Dieting & Weight Loss  Dispense: 180 tablet; Refill: 1   9. Medication management  - CBC with Differential/Platelet - COMPLETE METABOLIC PANEL WITH GFR - Magnesium - Lipid panel - TSH - Hemoglobin A1c - Insulin, random - Testosterone - Uric acid - VITAMIN D 25 Hydroxyl        Discussed  regular exercise, BP monitoring, weight control to achieve/maintain BMI less than 25 and discussed med and SE's. Recommended labs to assess and monitor clinical status with further disposition pending results of labs.  I discussed the assessment and treatment plan with the patient. The patient was provided an opportunity to ask questions and all were answered. The patient agreed with the plan and demonstrated an understanding of the instructions. I provided over 30 minutes of exam, counseling, chart review and  complex critical decision making.

## 2019-01-06 ENCOUNTER — Encounter: Payer: Self-pay | Admitting: Internal Medicine

## 2019-01-06 ENCOUNTER — Ambulatory Visit: Payer: BC Managed Care – PPO | Admitting: Internal Medicine

## 2019-01-06 ENCOUNTER — Other Ambulatory Visit: Payer: Self-pay

## 2019-01-06 VITALS — BP 124/76 | HR 72 | Temp 97.4°F | Resp 16 | Ht 70.5 in | Wt 243.2 lb

## 2019-01-06 DIAGNOSIS — Z79899 Other long term (current) drug therapy: Secondary | ICD-10-CM | POA: Diagnosis not present

## 2019-01-06 DIAGNOSIS — E66811 Obesity, class 1: Secondary | ICD-10-CM

## 2019-01-06 DIAGNOSIS — E349 Endocrine disorder, unspecified: Secondary | ICD-10-CM | POA: Diagnosis not present

## 2019-01-06 DIAGNOSIS — E1165 Type 2 diabetes mellitus with hyperglycemia: Secondary | ICD-10-CM | POA: Diagnosis not present

## 2019-01-06 DIAGNOSIS — E782 Mixed hyperlipidemia: Secondary | ICD-10-CM | POA: Diagnosis not present

## 2019-01-06 DIAGNOSIS — K2100 Gastro-esophageal reflux disease with esophagitis, without bleeding: Secondary | ICD-10-CM

## 2019-01-06 DIAGNOSIS — E669 Obesity, unspecified: Secondary | ICD-10-CM

## 2019-01-06 DIAGNOSIS — E559 Vitamin D deficiency, unspecified: Secondary | ICD-10-CM

## 2019-01-06 DIAGNOSIS — I10 Essential (primary) hypertension: Secondary | ICD-10-CM

## 2019-01-06 DIAGNOSIS — M1000 Idiopathic gout, unspecified site: Secondary | ICD-10-CM

## 2019-01-06 MED ORDER — PHENTERMINE HCL 37.5 MG PO TABS: ORAL_TABLET | ORAL | 1 refills | Status: DC

## 2019-01-06 MED ORDER — TOPIRAMATE 50 MG PO TABS: ORAL_TABLET | ORAL | 1 refills | Status: DC

## 2019-01-07 LAB — TESTOSTERONE: Testosterone: 196 ng/dL — ABNORMAL LOW (ref 250–827)

## 2019-01-07 LAB — URIC ACID: Uric Acid, Serum: 5.6 mg/dL (ref 4.0–8.0)

## 2019-01-07 LAB — LIPID PANEL
Cholesterol: 162 mg/dL (ref <200)
HDL: 23 mg/dL — ABNORMAL LOW (ref >=40)
LDL Cholesterol (Calc): 89 mg/dL (calc)
Non-HDL Cholesterol (Calc): 139 mg/dL (calc) — ABNORMAL HIGH (ref <130)
Total CHOL/HDL Ratio: 7 (calc) — ABNORMAL HIGH (ref <5.0)
Triglycerides: 373 mg/dL — ABNORMAL HIGH (ref <150)

## 2019-01-07 LAB — CBC WITH DIFFERENTIAL/PLATELET
Absolute Monocytes: 356 cells/uL (ref 200–950)
Basophils Absolute: 32 cells/uL (ref 0–200)
Basophils Relative: 0.6 %
Eosinophils Absolute: 167 cells/uL (ref 15–500)
Eosinophils Relative: 3.1 %
HCT: 43.8 % (ref 38.5–50.0)
Hemoglobin: 14.8 g/dL (ref 13.2–17.1)
Lymphs Abs: 2425 cells/uL (ref 850–3900)
MCH: 28.2 pg (ref 27.0–33.0)
MCHC: 33.8 g/dL (ref 32.0–36.0)
MCV: 83.6 fL (ref 80.0–100.0)
MPV: 10.4 fL (ref 7.5–12.5)
Monocytes Relative: 6.6 %
Neutro Abs: 2419 cells/uL (ref 1500–7800)
Neutrophils Relative %: 44.8 %
Platelets: 191 10*3/uL (ref 140–400)
RBC: 5.24 10*6/uL (ref 4.20–5.80)
RDW: 13.1 % (ref 11.0–15.0)
Total Lymphocyte: 44.9 %
WBC: 5.4 10*3/uL (ref 3.8–10.8)

## 2019-01-07 LAB — TSH: TSH: 1.75 mIU/L (ref 0.40–4.50)

## 2019-01-07 LAB — COMPLETE METABOLIC PANEL WITH GFR
AG Ratio: 1.8 (calc) (ref 1.0–2.5)
ALT: 36 U/L (ref 9–46)
AST: 30 U/L (ref 10–35)
Albumin: 4.2 g/dL (ref 3.6–5.1)
Alkaline phosphatase (APISO): 53 U/L (ref 35–144)
BUN: 18 mg/dL (ref 7–25)
CO2: 26 mmol/L (ref 20–32)
Calcium: 9.7 mg/dL (ref 8.6–10.3)
Chloride: 102 mmol/L (ref 98–110)
Creat: 0.74 mg/dL (ref 0.70–1.33)
GFR, Est African American: 122 mL/min/{1.73_m2} (ref >=60)
GFR, Est Non African American: 105 mL/min/{1.73_m2} (ref >=60)
Globulin: 2.3 g/dL (calc) (ref 1.9–3.7)
Glucose, Bld: 248 mg/dL — ABNORMAL HIGH (ref 65–99)
Potassium: 4.2 mmol/L (ref 3.5–5.3)
Sodium: 139 mmol/L (ref 135–146)
Total Bilirubin: 0.4 mg/dL (ref 0.2–1.2)
Total Protein: 6.5 g/dL (ref 6.1–8.1)

## 2019-01-07 LAB — HEMOGLOBIN A1C
Hgb A1c MFr Bld: 11.1 % of total Hgb — ABNORMAL HIGH (ref <5.7)
Mean Plasma Glucose: 272 (calc)
eAG (mmol/L): 15.1 (calc)

## 2019-01-07 LAB — MAGNESIUM: Magnesium: 1.5 mg/dL (ref 1.5–2.5)

## 2019-01-07 LAB — INSULIN, RANDOM: Insulin: 44.1 u[IU]/mL — ABNORMAL HIGH

## 2019-01-07 LAB — VITAMIN D 25 HYDROXY (VIT D DEFICIENCY, FRACTURES): Vit D, 25-Hydroxy: 67 ng/mL (ref 30–100)

## 2019-01-08 ENCOUNTER — Other Ambulatory Visit: Payer: Self-pay | Admitting: Internal Medicine

## 2019-01-08 DIAGNOSIS — E1165 Type 2 diabetes mellitus with hyperglycemia: Secondary | ICD-10-CM

## 2019-01-08 MED ORDER — INSULIN NPH ISOPHANE & REGULAR (70-30) 100 UNIT/ML ~~LOC~~ SUSP: SUBCUTANEOUS | 3 refills | Status: DC

## 2019-01-08 MED ORDER — GLIPIZIDE 5 MG PO TABS: ORAL_TABLET | ORAL | 1 refills | Status: DC

## 2019-01-09 ENCOUNTER — Encounter: Payer: Self-pay | Admitting: Internal Medicine

## 2019-01-17 DIAGNOSIS — H40033 Anatomical narrow angle, bilateral: Secondary | ICD-10-CM | POA: Diagnosis not present

## 2019-01-20 DIAGNOSIS — H40033 Anatomical narrow angle, bilateral: Secondary | ICD-10-CM | POA: Diagnosis not present

## 2019-01-25 DIAGNOSIS — H40033 Anatomical narrow angle, bilateral: Secondary | ICD-10-CM | POA: Diagnosis not present

## 2019-02-02 ENCOUNTER — Other Ambulatory Visit (INDEPENDENT_AMBULATORY_CARE_PROVIDER_SITE_OTHER): Payer: Self-pay | Admitting: Orthopaedic Surgery

## 2019-02-02 NOTE — Telephone Encounter (Signed)
Please advise 

## 2019-02-02 NOTE — Telephone Encounter (Signed)
Ok thanks 

## 2019-02-03 NOTE — Telephone Encounter (Signed)
OK - thanks

## 2019-03-03 ENCOUNTER — Other Ambulatory Visit: Payer: Self-pay | Admitting: Internal Medicine

## 2019-03-03 DIAGNOSIS — H5213 Myopia, bilateral: Secondary | ICD-10-CM | POA: Diagnosis not present

## 2019-03-03 DIAGNOSIS — H26492 Other secondary cataract, left eye: Secondary | ICD-10-CM | POA: Diagnosis not present

## 2019-03-03 MED ORDER — FENOFIBRATE 160 MG PO TABS: ORAL_TABLET | ORAL | 3 refills | Status: DC

## 2019-03-03 MED ORDER — ATENOLOL 100 MG PO TABS: ORAL_TABLET | ORAL | 3 refills | Status: DC

## 2019-03-23 DIAGNOSIS — H25042 Posterior subcapsular polar age-related cataract, left eye: Secondary | ICD-10-CM | POA: Diagnosis not present

## 2019-03-23 DIAGNOSIS — H5201 Hypermetropia, right eye: Secondary | ICD-10-CM | POA: Diagnosis not present

## 2019-03-23 DIAGNOSIS — E113393 Type 2 diabetes mellitus with moderate nonproliferative diabetic retinopathy without macular edema, bilateral: Secondary | ICD-10-CM | POA: Diagnosis not present

## 2019-03-23 DIAGNOSIS — H2513 Age-related nuclear cataract, bilateral: Secondary | ICD-10-CM | POA: Diagnosis not present

## 2019-04-01 ENCOUNTER — Other Ambulatory Visit: Payer: Self-pay

## 2019-04-01 DIAGNOSIS — E119 Type 2 diabetes mellitus without complications: Secondary | ICD-10-CM

## 2019-04-01 MED ORDER — CONTOUR NEXT MONITOR W/DEVICE KIT: 1.0000 | PACK | Freq: Three times a day (TID) | 0 refills | Status: AC

## 2019-04-05 ENCOUNTER — Other Ambulatory Visit: Payer: Self-pay | Admitting: Internal Medicine

## 2019-04-05 DIAGNOSIS — E119 Type 2 diabetes mellitus without complications: Secondary | ICD-10-CM

## 2019-04-05 MED ORDER — CONTOUR NEXT TEST VI STRP: ORAL_STRIP | 3 refills | Status: AC

## 2019-04-12 ENCOUNTER — Encounter: Payer: Self-pay | Admitting: Orthopaedic Surgery

## 2019-04-19 DIAGNOSIS — H25042 Posterior subcapsular polar age-related cataract, left eye: Secondary | ICD-10-CM | POA: Diagnosis not present

## 2019-04-19 DIAGNOSIS — H25812 Combined forms of age-related cataract, left eye: Secondary | ICD-10-CM | POA: Diagnosis not present

## 2019-04-19 DIAGNOSIS — H2512 Age-related nuclear cataract, left eye: Secondary | ICD-10-CM | POA: Diagnosis not present

## 2019-04-21 NOTE — Progress Notes (Signed)
FOLLOW UP  Assessment and Plan:   Hypertension Well controlled with current medications  Monitor blood pressure at home; patient to call if consistently greater than 130/80 Continue DASH diet.   Reminder to go to the ER if any CP, SOB, nausea, dizziness, severe HA, changes vision/speech, left arm numbness and tingling and jaw pain.  Cholesterol Currently at LDL goal without medication; triglycerides remain elevated Discussed benefit of statin in poorly controlled diabetic Continue low cholesterol diet and exercise.  Check lipid panel.   Poorly controlled T2 Diabetes without complications Continue medication: metformin 500 mg 2 tabs BID,glyburide 5 mg TID, cinnamon 1000 mg BID Has failed several other agents Add on ozempic, will try before we send in for patient, samples given.  Declines continuous monitor Declines referral to endocrinologist Discussed continued weight loss, carb quality and portion control Discussed insulin vs endocrinology should A1Cs continue to not improve Continue diet and exercise.  Perform daily foot/skin check, notify office of any concerning changes.  Check A1C  Obesity with co morbidities Long discussion about weight loss, diet, and exercise Recommended diet heavy in fruits and veggies and low in animal meats, cheeses, and dairy products, appropriate calorie intake Discussed ideal weight for height and initial weight goal (229 lb) Patient will work on portion control for carbs, increase vegetables Will follow up in 3 months  Vitamin D Def At goal at last visit; continue supplementation to maintain goal of 70-100  Continue diet and meds as discussed. Further disposition pending results of labs. Discussed med's effects and SE's.   Over 30 minutes of exam, counseling, chart review, and critical decision making was performed.   Future Appointments  Date Time Provider [place]  07/27/2019 11:00 AM Unk Pinto, MD [facility] None     ----------------------------------------------------------------------------------------------------------------------  HPI 53 y.o. male  presents for 3 month follow up on hypertension, cholesterol, T2 diabetes, obesity and vitamin D deficiency.   BMI is Body mass index is 35.36 kg/m., he has been working on diet and exercise. Wt Readings from Last 3 Encounters:  04/25/19 250 lb (113.4 kg)  01/06/19 243 lb 3.2 oz (110.3 kg)  10/12/18 229 lb 6.4 oz (104.1 kg)   His blood pressure has been controlled at home, today their BP is BP: 122/76  He does workout sporatically. He denies chest pain, shortness of breath, dizziness.  BMI is Body mass index is 35.36 kg/m., he is working on diet and exercise. Could not tolerate Topamax due to vision changes.  Wt Readings from Last 3 Encounters:  04/25/19 250 lb (113.4 kg)  01/06/19 243 lb 3.2 oz (110.3 kg)  10/12/18 229 lb 6.4 oz (104.1 kg)    He is not on cholesterol medication and denies myalgias. His cholesterol is not at goal of less than 70. The cholesterol last visit was:   Lab Results  Component Value Date   CHOL 162 01/06/2019   HDL 23 (L) 01/06/2019   LDLCALC 89 01/06/2019   TRIG 373 (H) 01/06/2019   CHOLHDL 7.0 (H) 01/06/2019    He has been working on diet and exercise for T2 diabetes uncontrolled - need to get sugars down for back surgery- he is very frustrated.  He is on ARB, no CKD Hyperlipidemia, not on med, not at goal he is on fenofirbrate He is on gabapentin He is on insulin 70/30: 60-65 units qam and 50 -55 units qpm- some days will take 25 units.  He is on glipizide 5 mg denies hypoglycemia , increased appetite, nausea,  paresthesia of the feet, polydipsia, polyuria, visual disturbances, vomiting and weight loss.  He does sporadically check fasting blood sugars and range from 120-280, contour- average has been 170. Checks sugars twice a day.  He reports no low sugars He was Last A1C in the office was:  Lab Results   Component Value Date   HGBA1C 11.1 (H) 01/06/2019     Lab Results  Component Value Date   GFRNONAA 105 01/06/2019   Patient is on Vitamin D supplement and at goal at recent check:    Lab Results  Component Value Date   VD25OH 67 01/06/2019       Current Medications:  Current Outpatient Medications on File Prior to Visit  Medication Sig  . aspirin 81 MG tablet Take 81 mg by mouth daily.  Marland Kitchen atenolol (TENORMIN) 100 MG tablet Take 1 tablet Daily for BP  . Blood Glucose Monitoring Suppl (CONTOUR NEXT MONITOR) w/Device KIT 1 Device by Does not apply route 3 (three) times daily.  . Cholecalciferol (VITAMIN D) 2000 units CAPS Take 4 capsules by mouth daily.   . Cinnamon 500 MG capsule Take 500 mg by mouth 4 (four) times daily.  . fenofibrate 160 MG tablet Take 1 tablet Daily for Triglycerides (Blood Fats)  . gabapentin (NEURONTIN) 300 MG capsule TAKE 1 CAPSULE BY MOUTH FOUR TIMES DAILY  . glipiZIDE (GLUCOTROL) 5 MG tablet Take 1 tablet 3 x /day with meals for Diabetes  . glucose blood (CONTOUR NEXT TEST) test strip Test blood sugar  three times a day before meals  . metFORMIN (GLUCOPHAGE-XR) 500 MG 24 hr tablet Take 2 tablets 2 x /Day with Food for Diabetes  . MICROLET LANCETS MISC USE TO TEST UP TO THREE TIMES DAILY AS DIRECTED  . olmesartan (BENICAR) 40 MG tablet Take 1/2 to 1 tablet at night for BP  . OVER THE COUNTER MEDICATION Novolin N 70/30-- injects 35 units in the AM and 30 units in the PM.  . ANDROGEL PUMP 20.25 MG/ACT (1.62%) GEL APPLY 2 PUMPS ON EACH ARM (4 TOTAL PUMPS) DAILY  . HYDROcodone-acetaminophen (NORCO) 10-325 MG tablet Take 1 tablet by mouth every 6 (six) hours as needed.  . insulin NPH-regular Human (NOVOLIN 70/30) (70-30) 100 UNIT/ML injection Take 2 x /day as directed   No current facility-administered medications on file prior to visit.      Allergies:  Allergies  Allergen Reactions  . Zocor [Simvastatin]     Headache  . Zoloft [Sertraline Hcl]      dysphona     Medical History:  Past Medical History:  Diagnosis Date  . Asthma   . Diabetes mellitus without complication ([place])   . Fatty liver disease, nonalcoholic   . GERD (gastroesophageal reflux disease)   . Gout   . Hyperlipidemia   . Hypertension   . Hypogonadism male   . Obesity   . Venous insufficiency    Family history- Reviewed and unchanged Social history- Reviewed and unchanged   Review of Systems:  Review of Systems  Constitutional: Negative for malaise/fatigue and weight loss.  HENT: Negative for hearing loss and tinnitus.   Eyes: Negative for blurred vision and double vision.  Respiratory: Negative for cough, shortness of breath and wheezing.   Cardiovascular: Negative for chest pain, palpitations, orthopnea, claudication and leg swelling.  Gastrointestinal: Negative for abdominal pain, blood in stool, constipation, diarrhea, heartburn, melena, nausea and vomiting.  Genitourinary: Negative.   Musculoskeletal: Negative for joint pain and myalgias.  Skin: Negative for  rash.  Neurological: Negative for dizziness, tingling, sensory change, weakness and headaches.  Endo/Heme/Allergies: Negative for polydipsia.  Psychiatric/Behavioral: Negative.   All other systems reviewed and are negative.   Physical Exam: BP 122/76   Pulse 86   Temp 97.7 F (36.5 C)   Ht 5' 10.5" (1.791 m)   Wt 250 lb (113.4 kg)   SpO2 98%   BMI 35.36 kg/m  Wt Readings from Last 3 Encounters:  04/25/19 250 lb (113.4 kg)  01/06/19 243 lb 3.2 oz (110.3 kg)  10/12/18 229 lb 6.4 oz (104.1 kg)   General Appearance: Well nourished, in no apparent distress. Eyes: PERRLA, EOMs, conjunctiva no swelling or erythema Sinuses: No Frontal/maxillary tenderness ENT/Mouth: Ext aud canals clear, TMs without erythema, bulging. No erythema, swelling, or exudate on post pharynx.  Tonsils not swollen or erythematous. Hearing normal.  Neck: Supple, thyroid normal.  Respiratory: Respiratory effort  normal, BS equal bilaterally without rales, rhonchi, wheezing or stridor.  Cardio: RRR with no MRGs. Brisk peripheral pulses without edema.  Abdomen: Soft, + BS.  Non tender, no guarding, rebound, hernias, masses. Lymphatics: Non tender without lymphadenopathy.  Musculoskeletal: Full ROM, 5/5 strength, Normal gait Skin: Warm, dry without rashes, lesions, ecchymosis.  Neuro: Cranial nerves intact. No cerebellar symptoms.  Psych: Awake and oriented X 3, normal affect, Insight and Judgment appropriate.    Vicie Mutters, PA-C 4:33 PM [facility]

## 2019-04-25 ENCOUNTER — Encounter: Payer: Self-pay | Admitting: Physician Assistant

## 2019-04-25 ENCOUNTER — Other Ambulatory Visit: Payer: Self-pay

## 2019-04-25 ENCOUNTER — Ambulatory Visit: Payer: BC Managed Care – PPO | Admitting: Physician Assistant

## 2019-04-25 VITALS — BP 122/76 | HR 86 | Temp 97.7°F | Ht 70.5 in | Wt 250.0 lb

## 2019-04-25 DIAGNOSIS — E782 Mixed hyperlipidemia: Secondary | ICD-10-CM

## 2019-04-25 DIAGNOSIS — E559 Vitamin D deficiency, unspecified: Secondary | ICD-10-CM

## 2019-04-25 DIAGNOSIS — I10 Essential (primary) hypertension: Secondary | ICD-10-CM

## 2019-04-25 DIAGNOSIS — E119 Type 2 diabetes mellitus without complications: Secondary | ICD-10-CM | POA: Diagnosis not present

## 2019-04-25 DIAGNOSIS — M1000 Idiopathic gout, unspecified site: Secondary | ICD-10-CM

## 2019-04-25 DIAGNOSIS — Z79899 Other long term (current) drug therapy: Secondary | ICD-10-CM

## 2019-04-25 DIAGNOSIS — E349 Endocrine disorder, unspecified: Secondary | ICD-10-CM | POA: Diagnosis not present

## 2019-04-25 NOTE — Patient Instructions (Addendum)
Start ozempic injection as shown once a week. The starting dose is 0.25 mg on the pen for the first 4 weeks.  You may inject in the stomach, thigh or arm. You may experience nausea in the first few days which usually goes away.   You will feel fullness of the stomach with starting the medication and should try to keep the portions at meals small.  After 4 weeks increase the dose to 0.5mg  daily if no nausea present.    If any questions or concerns are present call the office  After another month we can try to increase to 1 mg but people often experience nausea with this.    Please check blood sugars at least half the time about 2 hours after any meal and 3 times per week on waking up. Please bring blood sugar monitor to each visit. Recommended blood sugar levels about 2 hours after meal is 140-180 and on waking up 90-130  We may need to cut back on your insulin.       Semaglutide injection solution What is this medicine? SEMAGLUTIDE (Sem a GLOO tide) is used to improve blood sugar control in adults with type 2 diabetes. This medicine may be used with other diabetes medicines. This drug may also reduce the risk of heart attack or stroke if you have type 2 diabetes and risk factors for heart disease. This medicine may be used for other purposes; ask your health care provider or pharmacist if you have questions. COMMON BRAND NAME(S): OZEMPIC What should I tell my health care provider before I take this medicine? They need to know if you have any of these conditions:  endocrine tumors (MEN 2) or if someone in your family had these tumors  eye disease, vision problems- there is nothing in UPTODATE or the database about eye issues with this medications  history of pancreatitis  kidney disease  stomach problems  thyroid cancer or if someone in your family had thyroid cancer  an unusual or allergic reaction to semaglutide, other medicines, foods, dyes, or preservatives  pregnant or  trying to get pregnant  breast-feeding How should I use this medicine? This medicine is for injection under the skin of your upper leg (thigh), stomach area, or upper arm. It is given once every week (every 7 days). You will be taught how to prepare and give this medicine. Use exactly as directed. Take your medicine at regular intervals. Do not take it more often than directed. If you use this medicine with insulin, you should inject this medicine and the insulin separately. Do not mix them together. Do not give the injections right next to each other. Change (rotate) injection sites with each injection. It is important that you put your used needles and syringes in a special sharps container. Do not put them in a trash can. If you do not have a sharps container, call your pharmacist or healthcare provider to get one. A special MedGuide will be given to you by the pharmacist with each prescription and refill. Be sure to read this information carefully each time. Talk to your pediatrician regarding the use of this medicine in children. Special care may be needed. Overdosage: If you think you have taken too much of this medicine contact a poison control center or emergency room at once. NOTE: This medicine is only for you. Do not share this medicine with others. What if I miss a dose? If you miss a dose, take it as soon as you  can within 5 days after the missed dose. Then take your next dose at your regular weekly time. If it has been longer than 5 days after the missed dose, do not take the missed dose. Take the next dose at your regular time. Do not take double or extra doses. If you have questions about a missed dose, contact your health care provider for advice. What may interact with this medicine?  other medicines for diabetes Many medications may cause changes in blood sugar, these include:  alcohol containing beverages  antiviral medicines for HIV or AIDS  aspirin and aspirin-like  drugs  certain medicines for blood pressure, heart disease, irregular heart beat  chromium  diuretics  male hormones, such as estrogens or progestins, birth control pills  fenofibrate  gemfibrozil  isoniazid  lanreotide  male hormones or anabolic steroids  MAOIs like Carbex, Eldepryl, Marplan, Nardil, and Parnate  medicines for weight loss  medicines for allergies, asthma, cold, or cough  medicines for depression, anxiety, or psychotic disturbances  niacin  nicotine  NSAIDs, medicines for pain and inflammation, like ibuprofen or naproxen  octreotide  pasireotide  pentamidine  phenytoin  probenecid  quinolone antibiotics such as ciprofloxacin, levofloxacin, ofloxacin  some herbal dietary supplements  steroid medicines such as prednisone or cortisone  sulfamethoxazole; trimethoprim  thyroid hormones Some medications can hide the warning symptoms of low blood sugar (hypoglycemia). You may need to monitor your blood sugar more closely if you are taking one of these medications. These include:  beta-blockers, often used for high blood pressure or heart problems (examples include atenolol, metoprolol, propranolol)  clonidine  guanethidine  reserpine This list may not describe all possible interactions. Give your health care provider a list of all the medicines, herbs, non-prescription drugs, or dietary supplements you use. Also tell them if you smoke, drink alcohol, or use illegal drugs. Some items may interact with your medicine. What should I watch for while using this medicine? Visit your doctor or health care professional for regular checks on your progress. Drink plenty of fluids while taking this medicine. Check with your doctor or health care professional if you get an attack of severe diarrhea, nausea, and vomiting. The loss of too much body fluid can make it dangerous for you to take this medicine. A test called the HbA1C (A1C) will be monitored.  This is a simple blood test. It measures your blood sugar control over the last 2 to 3 months. You will receive this test every 3 to 6 months. Learn how to check your blood sugar. Learn the symptoms of low and high blood sugar and how to manage them. Always carry a quick-source of sugar with you in case you have symptoms of low blood sugar. Examples include hard sugar candy or glucose tablets. Make sure others know that you can choke if you eat or drink when you develop serious symptoms of low blood sugar, such as seizures or unconsciousness. They must get medical help at once. Tell your doctor or health care professional if you have high blood sugar. You might need to change the dose of your medicine. If you are sick or exercising more than usual, you might need to change the dose of your medicine. Do not skip meals. Ask your doctor or health care professional if you should avoid alcohol. Many nonprescription cough and cold products contain sugar or alcohol. These can affect blood sugar. Pens should never be shared. Even if the needle is changed, sharing may result in passing  of viruses like hepatitis or HIV. Wear a medical ID bracelet or chain, and carry a card that describes your disease and details of your medicine and dosage times. Do not become pregnant while taking this medicine. Women should inform their doctor if they wish to become pregnant or think they might be pregnant. There is a potential for serious side effects to an unborn child. Talk to your health care professional or pharmacist for more information. What side effects may I notice from receiving this medicine? Side effects that you should report to your doctor or health care professional as soon as possible:  allergic reactions like skin rash, itching or hives, swelling of the face, lips, or tongue  breathing problems  changes in vision  diarrhea that continues or is severe  lump or swelling on the neck  severe  nausea  signs and symptoms of infection like fever or chills; cough; sore throat; pain or trouble passing urine  signs and symptoms of low blood sugar such as feeling anxious, confusion, dizziness, increased hunger, unusually weak or tired, sweating, shakiness, cold, irritable, headache, blurred vision, fast heartbeat, loss of consciousness  signs and symptoms of kidney injury like trouble passing urine or change in the amount of urine  trouble swallowing  unusual stomach upset or pain  vomiting Side effects that usually do not require medical attention (report to your doctor or health care professional if they continue or are bothersome):  constipation  diarrhea  nausea  pain, redness, or irritation at site where injected  stomach upset This list may not describe all possible side effects. Call your doctor for medical advice about side effects. You may report side effects to FDA at 1-800-FDA-1088. Where should I keep my medicine? Keep out of the reach of children. Store unopened pens in a refrigerator between 2 and 8 degrees C (36 and 46 degrees F). Do not freeze. Protect from light and heat. After you first use the pen, it can be stored for 56 days at room temperature between 15 and 30 degrees C (59 and 86 degrees F) or in a refrigerator. Throw away your used pen after 56 days or after the expiration date, whichever comes first. Do not store your pen with the needle attached. If the needle is left on, medicine may leak from the pen. NOTE: This sheet is a summary. It may not cover all possible information. If you have questions about this medicine, talk to your doctor, pharmacist, or health care provider.  2020 Elsevier/Gold Standard (2018-08-20 14:25:32)  If your morning sugar is always below 100 then the issue is with your sugar spiking after meals. Try to take your blood sugar approximately 2 hours after eating, this number should be less than 200. If it is not, think about the  foods that you ate and better choices you can make.   What does your A1C results mean?  Your A1C is a measure of your sugar over the past 3 months   Use this chart as a guide to compare the results of your A1C blood test to your estimated average daily blood sugar:  A1C Range Average Sugar  4.0-6.0% 60-120 mg/dl  8.6-7.6% 720 - 947 mg/dl  0.9-6.2% 836-629 mg/dl  4.7-6.5% 465-035 mg/dl  46.5-68% 127-517 mg/dl  00.1-74.9% 449-675 mg/dl  91.6-38.4% 665-993 mg/dl  57.0-17.7% 939-030 mg/dl  Greater than 09.2% Greater than 360 mg/dl    Somogyi effect The brain needs two things: oxygen and sugar. If the blood sugar level drops  too low in the early morning hours, hormones (such as growth hormone, cortisol, and catecholamines) are released to make sure you brain can still function. These help reverse the low blood sugar level but may lead to blood sugar levels that are higher than normal in the morning. This is common for patient that take insulin at night or do not ear regular snacks.  Please schedule to get up in the middle of the night to check your blood sugar.   This may be happening to you. Please eat a high protein night time snack and we will be decreasing your night time insulin as follows:

## 2019-04-26 LAB — COMPLETE METABOLIC PANEL WITH GFR
AG Ratio: 1.8 (calc) (ref 1.0–2.5)
ALT: 33 U/L (ref 9–46)
AST: 21 U/L (ref 10–35)
Albumin: 4.3 g/dL (ref 3.6–5.1)
Alkaline phosphatase (APISO): 46 U/L (ref 35–144)
BUN: 16 mg/dL (ref 7–25)
CO2: 27 mmol/L (ref 20–32)
Calcium: 9.8 mg/dL (ref 8.6–10.3)
Chloride: 103 mmol/L (ref 98–110)
Creat: 0.73 mg/dL (ref 0.70–1.33)
GFR, Est African American: 123 mL/min/{1.73_m2} (ref >=60)
GFR, Est Non African American: 106 mL/min/{1.73_m2} (ref >=60)
Globulin: 2.4 g/dL (calc) (ref 1.9–3.7)
Glucose, Bld: 191 mg/dL — ABNORMAL HIGH (ref 65–99)
Potassium: 4.3 mmol/L (ref 3.5–5.3)
Sodium: 140 mmol/L (ref 135–146)
Total Bilirubin: 0.4 mg/dL (ref 0.2–1.2)
Total Protein: 6.7 g/dL (ref 6.1–8.1)

## 2019-04-26 LAB — HEMOGLOBIN A1C
Hgb A1c MFr Bld: 9.1 % of total Hgb — ABNORMAL HIGH (ref <5.7)
Mean Plasma Glucose: 214 (calc)
eAG (mmol/L): 11.9 (calc)

## 2019-04-26 LAB — CBC WITH DIFFERENTIAL/PLATELET
Absolute Monocytes: 416 cells/uL (ref 200–950)
Basophils Absolute: 33 cells/uL (ref 0–200)
Basophils Relative: 0.5 %
Eosinophils Absolute: 350 cells/uL (ref 15–500)
Eosinophils Relative: 5.3 %
HCT: 44.7 % (ref 38.5–50.0)
Hemoglobin: 15.1 g/dL (ref 13.2–17.1)
Lymphs Abs: 3175 cells/uL (ref 850–3900)
MCH: 28.1 pg (ref 27.0–33.0)
MCHC: 33.8 g/dL (ref 32.0–36.0)
MCV: 83.1 fL (ref 80.0–100.0)
MPV: 10.5 fL (ref 7.5–12.5)
Monocytes Relative: 6.3 %
Neutro Abs: 2627 cells/uL (ref 1500–7800)
Neutrophils Relative %: 39.8 %
Platelets: 233 10*3/uL (ref 140–400)
RBC: 5.38 10*6/uL (ref 4.20–5.80)
RDW: 13.5 % (ref 11.0–15.0)
Total Lymphocyte: 48.1 %
WBC: 6.6 10*3/uL (ref 3.8–10.8)

## 2019-04-26 LAB — LIPID PANEL
Cholesterol: 154 mg/dL (ref <200)
HDL: 25 mg/dL — ABNORMAL LOW (ref >=40)
LDL Cholesterol (Calc): 95 mg/dL (calc)
Non-HDL Cholesterol (Calc): 129 mg/dL (calc) (ref <130)
Total CHOL/HDL Ratio: 6.2 (calc) — ABNORMAL HIGH (ref <5.0)
Triglycerides: 244 mg/dL — ABNORMAL HIGH (ref <150)

## 2019-04-26 LAB — TSH: TSH: 2.26 mIU/L (ref 0.40–4.50)

## 2019-04-26 LAB — MAGNESIUM: Magnesium: 1.6 mg/dL (ref 1.5–2.5)

## 2019-04-26 LAB — VITAMIN D 25 HYDROXY (VIT D DEFICIENCY, FRACTURES): Vit D, 25-Hydroxy: 62 ng/mL (ref 30–100)

## 2019-05-25 ENCOUNTER — Telehealth: Payer: Self-pay | Admitting: Physician Assistant

## 2019-05-25 DIAGNOSIS — E1165 Type 2 diabetes mellitus with hyperglycemia: Secondary | ICD-10-CM

## 2019-05-25 NOTE — Telephone Encounter (Signed)
Messaged patient to see how his sugars are doing

## 2019-05-26 ENCOUNTER — Other Ambulatory Visit (INDEPENDENT_AMBULATORY_CARE_PROVIDER_SITE_OTHER): Payer: Self-pay | Admitting: Orthopaedic Surgery

## 2019-05-27 NOTE — Telephone Encounter (Signed)
Ok to rf? 

## 2019-05-31 ENCOUNTER — Other Ambulatory Visit (INDEPENDENT_AMBULATORY_CARE_PROVIDER_SITE_OTHER): Payer: Self-pay | Admitting: Orthopaedic Surgery

## 2019-05-31 MED ORDER — FREESTYLE LIBRE SENSOR SYSTEM MISC: 0 refills | Status: DC

## 2019-05-31 MED ORDER — OZEMPIC (0.25 OR 0.5 MG/DOSE) 2 MG/1.5ML ~~LOC~~ SOPN: 0.5000 mg | PEN_INJECTOR | SUBCUTANEOUS | 3 refills | Status: DC

## 2019-05-31 NOTE — Telephone Encounter (Signed)
Rx request 

## 2019-06-01 NOTE — Telephone Encounter (Signed)
I sent someone else a message regarding this request.  This was denied.  I stated that he needs R OV with Dr. Lorin Mercy.  Can you please check to see who I sent that message to and take care of it.  Thanks.

## 2019-06-01 NOTE — Telephone Encounter (Signed)
Patient needs return office visit with Dr. Lorin Mercy.  Has not been seen since January 2020.

## 2019-06-02 ENCOUNTER — Encounter: Payer: Self-pay | Admitting: Orthopaedic Surgery

## 2019-06-02 MED ORDER — GABAPENTIN 300 MG PO CAPS: 300.0000 mg | ORAL_CAPSULE | Freq: Four times a day (QID) | ORAL | 0 refills | Status: DC

## 2019-06-02 NOTE — Telephone Encounter (Signed)
Message below from Dr. Lorin Mercy I have advised patient to follow up in a couple of months Refill submitted

## 2019-06-02 NOTE — Telephone Encounter (Signed)
Ok the refill , pt can followup in a couple months thanks   Message text

## 2019-06-14 MED ORDER — FREESTYLE LIBRE 14 DAY SENSOR MISC: 1.0000 "application " | 3 refills | Status: DC

## 2019-06-29 ENCOUNTER — Other Ambulatory Visit: Payer: Self-pay | Admitting: Internal Medicine

## 2019-06-29 DIAGNOSIS — E1165 Type 2 diabetes mellitus with hyperglycemia: Secondary | ICD-10-CM

## 2019-06-29 MED ORDER — GLIPIZIDE 5 MG PO TABS: ORAL_TABLET | ORAL | 3 refills | Status: DC

## 2019-07-25 ENCOUNTER — Encounter: Payer: Self-pay | Admitting: Internal Medicine

## 2019-07-27 ENCOUNTER — Encounter: Payer: Self-pay | Admitting: Internal Medicine

## 2019-07-27 ENCOUNTER — Ambulatory Visit: Payer: BC Managed Care – PPO | Admitting: Internal Medicine

## 2019-07-27 ENCOUNTER — Other Ambulatory Visit: Payer: Self-pay

## 2019-07-27 VITALS — BP 120/84 | HR 80 | Temp 97.0°F | Resp 16 | Ht 70.5 in | Wt 246.2 lb

## 2019-07-27 DIAGNOSIS — Z23 Encounter for immunization: Secondary | ICD-10-CM

## 2019-07-27 DIAGNOSIS — N401 Enlarged prostate with lower urinary tract symptoms: Secondary | ICD-10-CM

## 2019-07-27 DIAGNOSIS — Z1329 Encounter for screening for other suspected endocrine disorder: Secondary | ICD-10-CM

## 2019-07-27 DIAGNOSIS — R350 Frequency of micturition: Secondary | ICD-10-CM

## 2019-07-27 DIAGNOSIS — Z136 Encounter for screening for cardiovascular disorders: Secondary | ICD-10-CM | POA: Diagnosis not present

## 2019-07-27 DIAGNOSIS — Z8249 Family history of ischemic heart disease and other diseases of the circulatory system: Secondary | ICD-10-CM | POA: Diagnosis not present

## 2019-07-27 DIAGNOSIS — Z87891 Personal history of nicotine dependence: Secondary | ICD-10-CM | POA: Diagnosis not present

## 2019-07-27 DIAGNOSIS — Z111 Encounter for screening for respiratory tuberculosis: Secondary | ICD-10-CM

## 2019-07-27 DIAGNOSIS — Z125 Encounter for screening for malignant neoplasm of prostate: Secondary | ICD-10-CM | POA: Diagnosis not present

## 2019-07-27 DIAGNOSIS — Z0000 Encounter for general adult medical examination without abnormal findings: Secondary | ICD-10-CM

## 2019-07-27 DIAGNOSIS — Z0001 Encounter for general adult medical examination with abnormal findings: Secondary | ICD-10-CM

## 2019-07-27 DIAGNOSIS — Z13220 Encounter for screening for lipoid disorders: Secondary | ICD-10-CM

## 2019-07-27 DIAGNOSIS — Z130 Encounter for screening for diseases of the blood and blood-forming organs and certain disorders involving the immune mechanism: Secondary | ICD-10-CM | POA: Diagnosis not present

## 2019-07-27 DIAGNOSIS — E785 Hyperlipidemia, unspecified: Secondary | ICD-10-CM

## 2019-07-27 DIAGNOSIS — M1000 Idiopathic gout, unspecified site: Secondary | ICD-10-CM | POA: Diagnosis not present

## 2019-07-27 DIAGNOSIS — Z79899 Other long term (current) drug therapy: Secondary | ICD-10-CM | POA: Diagnosis not present

## 2019-07-27 DIAGNOSIS — K219 Gastro-esophageal reflux disease without esophagitis: Secondary | ICD-10-CM

## 2019-07-27 DIAGNOSIS — I10 Essential (primary) hypertension: Secondary | ICD-10-CM | POA: Diagnosis not present

## 2019-07-27 DIAGNOSIS — E1165 Type 2 diabetes mellitus with hyperglycemia: Secondary | ICD-10-CM

## 2019-07-27 DIAGNOSIS — R5383 Other fatigue: Secondary | ICD-10-CM

## 2019-07-27 DIAGNOSIS — Z1389 Encounter for screening for other disorder: Secondary | ICD-10-CM

## 2019-07-27 DIAGNOSIS — E349 Endocrine disorder, unspecified: Secondary | ICD-10-CM

## 2019-07-27 DIAGNOSIS — Z131 Encounter for screening for diabetes mellitus: Secondary | ICD-10-CM

## 2019-07-27 DIAGNOSIS — E559 Vitamin D deficiency, unspecified: Secondary | ICD-10-CM

## 2019-07-27 DIAGNOSIS — E1169 Type 2 diabetes mellitus with other specified complication: Secondary | ICD-10-CM

## 2019-07-27 DIAGNOSIS — Z1211 Encounter for screening for malignant neoplasm of colon: Secondary | ICD-10-CM

## 2019-07-27 NOTE — Patient Instructions (Signed)

## 2019-07-27 NOTE — Progress Notes (Addendum)
Annual  Screening/Preventative Visit  & Comprehensive Evaluation & Examination     This very nice 53 y.o. [name]  presents for a Screening /Preventative Visit & comprehensive evaluation and management of multiple medical co-morbidities.  Patient has been followed for HTN, HLD, T2_NIDDM  and Vitamin D Deficiency.     HTN predates since 2004. Patient's BP has been controlled at home.  Today's BP is at goal -  120/84. Patient denies any cardiac symptoms as chest pain, palpitations, shortness of breath, dizziness or ankle swelling.     Patient's hyperlipidemia is controlled with diet and medications. Patient denies myalgias or other medication SE's. Last lipids were at goal except elevated Trig's:  Lab Results  Component Value Date   CHOL 154 04/25/2019   HDL 25 (L) 04/25/2019   LDLCALC 95 04/25/2019   TRIG 244 (H) 04/25/2019   CHOLHDL 6.2 (H) 04/25/2019      Patient has hx/o T2_IDDM since 2004  and patient denies reactive hypoglycemic symptoms, visual blurring, diabetic polys or paresthesias. Last A1c was not at goal:  Lab Results  Component Value Date   HGBA1C 9.1 (H) 04/25/2019    Wt Readings from Last 3 Encounters:  07/27/19 246 lb 3.2 oz (111.7 kg)  04/25/19 250 lb (113.4 kg)  01/06/19 243 lb 3.2 oz (110.3 kg)       Finally, patient has history of Vitamin D Deficiency ("15" / 2009) and last vitamin D was at goal:  Lab Results  Component Value Date   VD25OH 62 04/25/2019   Current Outpatient Medications on File Prior to Visit  Medication Sig  . aspirin 81 MG tablet Take 81 mg by mouth daily.  Marland Kitchen atenolol (TENORMIN) 100 MG tablet Take 1 tablet Daily for BP  . Blood Glucose Monitoring Suppl (CONTOUR NEXT MONITOR) w/Device KIT 1 Device by Does not apply route 3 (three) times daily.  . Cholecalciferol (VITAMIN D) 2000 units CAPS Take 4 capsules by mouth daily.   . Cinnamon 500 MG capsule Take 500 mg by mouth 4 (four) times daily.  . fenofibrate 160 MG tablet Take 1 tablet Daily  for Triglycerides (Blood Fats)  . gabapentin (NEURONTIN) 300 MG capsule Take 1 capsule (300 mg total) by mouth 4 (four) times daily.  Marland Kitchen glipiZIDE (GLUCOTROL) 5 MG tablet Take 1 tablet 3 x /day with Meals for Diabetes  . glucose blood (CONTOUR NEXT TEST) test strip Test blood sugar  three times a day before meals  . metFORMIN (GLUCOPHAGE-XR) 500 MG 24 hr tablet Take 2 tablets 2 x /Day with Food for Diabetes  . MICROLET LANCETS MISC USE TO TEST UP TO THREE TIMES DAILY AS DIRECTED  . olmesartan (BENICAR) 40 MG tablet Take 1/2 to 1 tablet at night for BP  . OVER THE COUNTER MEDICATION Novolin N 70/30-- injects 35 units in the AM and 30 units in the PM.  . Semaglutide,0.25 or 0.5MG/DOS, (OZEMPIC, 0.25 OR 0.5 MG/DOSE,) 2 MG/1.5ML SOPN Inject 0.5 mg into the skin once a week.  . Continuous Blood Gluc Sensor (FREESTYLE LIBRE SENSOR SYSTEM) MISC Check sugars 3 x a day DX E10.9  . insulin NPH-regular Human (NOVOLIN 70/30) (70-30) 100 UNIT/ML injection Take 2 x /day as directed   No current facility-administered medications on file prior to visit.   Allergies  Allergen Reactions  . Topamax [Topiramate] Other (See Comments)    Vision changes/loss of vision  . Zocor [Simvastatin]     Headache  . Zoloft [Sertraline Hcl]  dysphona   Past Medical History:  Diagnosis Date  . Asthma   . Diabetes mellitus without complication ([place])   . Fatty liver disease, nonalcoholic   . GERD (gastroesophageal reflux disease)   . Gout   . Hyperlipidemia   . Hypertension   . Hypogonadism male   . Obesity   . Venous insufficiency    Health Maintenance  Topic Date Due  . PNEUMOCOCCAL POLYSACCHARIDE VACCINE AGE 34-64 HIGH RISK  06/27/1967  . OPHTHALMOLOGY EXAM  06/27/1975  . COLONOSCOPY  06/27/2015  . INFLUENZA VACCINE  03/05/2019  . HEMOGLOBIN A1C  10/23/2019  . FOOT EXAM  07/26/2020  . TETANUS/TDAP  02/10/2023  . HIV Screening  Completed   Immunization History  Administered Date(s) Administered  . PPD  Test 02/17/2014, 03/08/2015, 03/27/2016, 05/13/2017, 06/21/2018  . Pneumococcal-Unspecified 02/09/2013  . Tdap 02/09/2013   Last Colon - 08/08/1995 - Dr Sarina Ser - Negative  Past Surgical History:  Procedure Laterality Date  . ANTERIOR CRUCIATE LIGAMENT REPAIR Left 2001  . CYSTOSCOPY  1993  . ESOPHAGOGASTRODUODENOSCOPY  1995, 2002   gastritis, esophagititis  . KNEE ARTHROSCOPY Right 2002, 1995   Family History  Problem Relation Age of Onset  . Diabetes Mother   . Hypertension Mother   . Gout Mother   . COPD Father   . Cancer Sister 57       appendix  . Gout Brother   . Hyperlipidemia Brother   . Hypertension Brother   . Hypertension Brother   . Hyperlipidemia Brother   . Gout Brother   . Diabetes Brother   . COPD Sister    Social History   Socioeconomic History  . Marital status: Divorced  . Number of children: Not on file  Occupational History  . Not on file  Tobacco Use  . Smoking status: Former Smoker    Packs/day: 1.00    Years: 15.00    Pack years: 15.00    Types: Cigarettes    Quit date: 08/07/2000    Years since quitting: 18.9  . Smokeless tobacco: Never Used  Substance and Sexual Activity  . Alcohol use: Yes    Comment: social  . Drug use: No  . Sexual activity: Not on file     ROS Constitutional: Denies fever, chills, weight loss/gain, headaches, insomnia,  night sweats or change in appetite. Does c/o fatigue. Eyes: Denies redness, blurred vision, diplopia, discharge, itchy or watery eyes.  ENT: Denies discharge, congestion, post nasal drip, epistaxis, sore throat, earache, hearing loss, dental pain, Tinnitus, Vertigo, Sinus pain or snoring.  Cardio: Denies chest pain, palpitations, irregular heartbeat, syncope, dyspnea, diaphoresis, orthopnea, PND, claudication or edema Respiratory: denies cough, dyspnea, DOE, pleurisy, hoarseness, laryngitis or wheezing.  Gastrointestinal: Denies dysphagia, heartburn, reflux, water brash, pain, cramps, nausea,  vomiting, bloating, diarrhea, constipation, hematemesis, melena, hematochezia, jaundice or hemorrhoids Genitourinary: Denies dysuria, frequency, urgency, nocturia, hesitancy, discharge, hematuria or flank pain Musculoskeletal: Denies arthralgia, myalgia, stiffness, Jt. Swelling, pain, limp or strain/sprain. Denies Falls. Skin: Denies puritis, rash, hives, warts, acne, eczema or change in skin lesion Neuro: No weakness, tremor, incoordination, spasms, paresthesia or pain Psychiatric: Denies confusion, memory loss or sensory loss. Denies Depression. Endocrine: Denies change in weight, skin, hair change, nocturia, and paresthesia, diabetic polys, visual blurring or hyper / hypo glycemic episodes.  Heme/Lymph: No excessive bleeding, bruising or enlarged lymph nodes.  Physical Exam  BP 120/84   Pulse 80   Temp (!) 97 F (36.1 C)   Resp 16   Ht 5'  10.5" (1.791 m)   Wt 246 lb 3.2 oz (111.7 kg)   BMI 34.83 kg/m   General Appearance: Well nourished and well groomed and in no apparent distress.  Eyes: PERRLA, EOMs, conjunctiva no swelling or erythema, normal fundi and vessels. Sinuses: No frontal/maxillary tenderness ENT/Mouth: EACs patent / TMs  nl. Nares clear without erythema, swelling, mucoid exudates. Oral hygiene is good. No erythema, swelling, or exudate. Tongue normal, non-obstructing. Tonsils not swollen or erythematous. Hearing normal.  Neck: Supple, thyroid not palpable. No bruits, nodes or JVD. Respiratory: Respiratory effort normal.  BS equal and clear bilateral without rales, rhonci, wheezing or stridor. Cardio: Heart sounds are normal with regular rate and rhythm and no murmurs, rubs or gallops. Peripheral pulses are normal and equal bilaterally without edema. No aortic or femoral bruits. Chest: symmetric with normal excursions and percussion.  Abdomen: Soft, with Nl bowel sounds. Nontender, no guarding, rebound, hernias, masses, or organomegaly.  Lymphatics: Non tender without  lymphadenopathy.  Musculoskeletal: Full ROM all peripheral extremities, joint stability, 5/5 strength, and normal gait. Skin: Warm and dry without rashes, lesions, cyanosis, clubbing or  ecchymosis.  Neuro: Cranial nerves intact, reflexes equal bilaterally. Normal muscle tone, no cerebellar symptoms. Sensation intact to touch, vibratory and Monofilament to the toes bilaterally. Pysch: Alert and oriented X 3 with normal affect, insight and judgment appropriate.   Assessment and Plan  1. Annual Preventative/Screening Exam    2. Essential hypertension  - EKG 12-Lead - [place], retroperitnl abd,  ltd - Urinalysis, Routine w reflex microscopic - Microalbumin / Creatinine Urine Ratio - CBC with Diff - COMPLETE METABOLIC PANEL WITH GFR - Magnesium - TSH  3. Hyperlipidemia associated with type 2 diabetes mellitus (HCC)  - EKG 12-Lead - [place], retroperitnl abd,  ltd - Lipid Profile - TSH  4. Poorly controlled type 2 diabetes mellitus (HCC)  - EKG 12-Lead - [place], retroperitnl abd,  ltd - Urinalysis, Routine w reflex microscopic - Microalbumin / Creatinine Urine Ratio - HM DIABETES FOOT EXAM - LOW EXTREMITY NEUR EXAM DOCUM - Hemoglobin A1c ([facility])  5. Vitamin D deficiency  - Vitamin D (25 hydroxy)  6. Idiopathic gout  - Uric acid  7. Testosterone deficiency  - Testosterone, Total  8. Gastroesophageal reflux disease   9. Screening for tuberculosis  - TB Skin Test  10. Screening for colorectal cancer  - POC Hemoccult Bld/Stl (3-Cd Home Screen); Future - Cologard  11. Prostate cancer screening  - PSA  12. Screening for ischemic heart disease  - EKG 12-Lead  13. FH: hypertension  - EKG 12-Lead - [place], retroperitnl abd,  ltd  14. Former smoker  - EKG 12-Lead - [place], retroperitnl abd,  ltd  15. Screening for AAA (aortic abdominal aneurysm)  - [place], retroperitnl abd,  ltd  16. Fatigue, unspecified type  - Iron,Total/Total Iron Binding Cap - Vitamin B12  17.  Medication management  - Urinalysis, Routine w reflex microscopic - Microalbumin / Creatinine Urine Ratio - Testosterone, Total - Uric acid - CBC with Diff - COMPLETE METABOLIC PANEL WITH GFR - Magnesium - Lipid Profile - Hemoglobin A1c ([facility]) - Vitamin D (25 hydroxy)  18. Need for prophylactic vaccination against Streptococcus pneumoniae (pneumococcus)  - Pneumococcal polysaccharide vaccine 23-valent greater than or equal to 2yo subcutaneous/IM                Patient was counseled in prudent diet, weight control to achieve/maintain BMI less than 25, BP monitoring, regular exercise and medications as discussed.  Discussed  med effects and SE's. Routine screening labs and tests as requested with regular follow-up as recommended. Over 40 minutes of exam, counseling, chart review and high complex critical decision making was performed   Kirtland Bouchard, MD

## 2019-07-28 LAB — HEMOGLOBIN A1C
Hgb A1c MFr Bld: 7.7 % of total Hgb — ABNORMAL HIGH (ref <5.7)
Mean Plasma Glucose: 174 (calc)
eAG (mmol/L): 9.7 (calc)

## 2019-07-28 LAB — CBC WITH DIFFERENTIAL/PLATELET
Absolute Monocytes: 410 cells/uL (ref 200–950)
Basophils Absolute: 32 cells/uL (ref 0–200)
Basophils Relative: 0.5 %
Eosinophils Absolute: 309 cells/uL (ref 15–500)
Eosinophils Relative: 4.9 %
HCT: 45.2 % (ref 38.5–50.0)
Hemoglobin: 15.2 g/dL (ref 13.2–17.1)
Lymphs Abs: 2545 cells/uL (ref 850–3900)
MCH: 28.1 pg (ref 27.0–33.0)
MCHC: 33.6 g/dL (ref 32.0–36.0)
MCV: 83.7 fL (ref 80.0–100.0)
MPV: 10.3 fL (ref 7.5–12.5)
Monocytes Relative: 6.5 %
Neutro Abs: 3005 cells/uL (ref 1500–7800)
Neutrophils Relative %: 47.7 %
Platelets: 221 10*3/uL (ref 140–400)
RBC: 5.4 10*6/uL (ref 4.20–5.80)
RDW: 13.2 % (ref 11.0–15.0)
Total Lymphocyte: 40.4 %
WBC: 6.3 10*3/uL (ref 3.8–10.8)

## 2019-07-28 LAB — URINALYSIS, ROUTINE W REFLEX MICROSCOPIC
Bilirubin Urine: NEGATIVE
Hgb urine dipstick: NEGATIVE
Ketones, ur: NEGATIVE
Leukocytes,Ua: NEGATIVE
Nitrite: NEGATIVE
Protein, ur: NEGATIVE
Specific Gravity, Urine: 1.028 (ref 1.001–1.03)
pH: <=5 (ref 5.0–8.0)

## 2019-07-28 LAB — LIPID PANEL
Cholesterol: 136 mg/dL (ref <200)
HDL: 24 mg/dL — ABNORMAL LOW (ref >=40)
LDL Cholesterol (Calc): 78 mg/dL (calc)
Non-HDL Cholesterol (Calc): 112 mg/dL (calc) (ref <130)
Total CHOL/HDL Ratio: 5.7 (calc) — ABNORMAL HIGH (ref <5.0)
Triglycerides: 245 mg/dL — ABNORMAL HIGH (ref <150)

## 2019-07-28 LAB — COMPLETE METABOLIC PANEL WITH GFR
AG Ratio: 1.8 (calc) (ref 1.0–2.5)
ALT: 44 U/L (ref 9–46)
AST: 33 U/L (ref 10–35)
Albumin: 4.3 g/dL (ref 3.6–5.1)
Alkaline phosphatase (APISO): 50 U/L (ref 35–144)
BUN: 17 mg/dL (ref 7–25)
CO2: 27 mmol/L (ref 20–32)
Calcium: 10 mg/dL (ref 8.6–10.3)
Chloride: 103 mmol/L (ref 98–110)
Creat: 0.96 mg/dL (ref 0.70–1.33)
GFR, Est African American: 103 mL/min/{1.73_m2} (ref >=60)
GFR, Est Non African American: 89 mL/min/{1.73_m2} (ref >=60)
Globulin: 2.4 g/dL (calc) (ref 1.9–3.7)
Glucose, Bld: 224 mg/dL — ABNORMAL HIGH (ref 65–99)
Potassium: 4.9 mmol/L (ref 3.5–5.3)
Sodium: 140 mmol/L (ref 135–146)
Total Bilirubin: 0.4 mg/dL (ref 0.2–1.2)
Total Protein: 6.7 g/dL (ref 6.1–8.1)

## 2019-07-28 LAB — MICROALBUMIN / CREATININE URINE RATIO
Creatinine, Urine: 225 mg/dL (ref 20–320)
Microalb Creat Ratio: 9 mcg/mg creat (ref <30)
Microalb, Ur: 2 mg/dL

## 2019-07-28 LAB — PSA: PSA: 0.2 ng/mL (ref <=4.0)

## 2019-07-28 LAB — IRON, TOTAL/TOTAL IRON BINDING CAP
%SAT: 25 % (calc) (ref 20–48)
Iron: 102 ug/dL (ref 50–180)
TIBC: 409 mcg/dL (calc) (ref 250–425)

## 2019-07-28 LAB — URIC ACID: Uric Acid, Serum: 5.8 mg/dL (ref 4.0–8.0)

## 2019-07-28 LAB — TSH: TSH: 1.92 mIU/L (ref 0.40–4.50)

## 2019-07-28 LAB — VITAMIN B12: Vitamin B-12: 419 pg/mL (ref 200–1100)

## 2019-07-28 LAB — MAGNESIUM: Magnesium: 1.8 mg/dL (ref 1.5–2.5)

## 2019-07-28 LAB — TESTOSTERONE: Testosterone: 132 ng/dL — ABNORMAL LOW (ref 250–827)

## 2019-07-28 LAB — VITAMIN D 25 HYDROXY (VIT D DEFICIENCY, FRACTURES): Vit D, 25-Hydroxy: 70 ng/mL (ref 30–100)

## 2019-07-29 ENCOUNTER — Other Ambulatory Visit: Payer: Self-pay | Admitting: Internal Medicine

## 2019-07-29 DIAGNOSIS — N410 Acute prostatitis: Secondary | ICD-10-CM

## 2019-07-29 MED ORDER — CIPROFLOXACIN HCL 500 MG PO TABS: ORAL_TABLET | ORAL | 0 refills | Status: DC

## 2019-07-30 ENCOUNTER — Encounter: Payer: Self-pay | Admitting: Internal Medicine

## 2019-07-31 NOTE — Addendum Note (Signed)
Addended by: Unk Pinto on: 07/31/2019 07:48 PM   Modules accepted: Orders

## 2019-08-01 ENCOUNTER — Encounter: Payer: Self-pay | Admitting: Physician Assistant

## 2019-09-08 DIAGNOSIS — Z20822 Contact with and (suspected) exposure to covid-19: Secondary | ICD-10-CM | POA: Diagnosis not present

## 2019-09-08 DIAGNOSIS — R05 Cough: Secondary | ICD-10-CM | POA: Diagnosis not present

## 2019-09-08 DIAGNOSIS — R0981 Nasal congestion: Secondary | ICD-10-CM | POA: Diagnosis not present

## 2019-09-11 DIAGNOSIS — Z20828 Contact with and (suspected) exposure to other viral communicable diseases: Secondary | ICD-10-CM | POA: Diagnosis not present

## 2019-09-28 ENCOUNTER — Other Ambulatory Visit: Payer: Self-pay | Admitting: Internal Medicine

## 2019-09-28 DIAGNOSIS — E1165 Type 2 diabetes mellitus with hyperglycemia: Secondary | ICD-10-CM

## 2019-09-28 DIAGNOSIS — I10 Essential (primary) hypertension: Secondary | ICD-10-CM

## 2019-09-28 MED ORDER — OLMESARTAN MEDOXOMIL 40 MG PO TABS: ORAL_TABLET | ORAL | 3 refills | Status: DC

## 2019-09-28 MED ORDER — IPRATROPIUM BROMIDE 0.06 % NA SOLN: NASAL | 3 refills | Status: DC

## 2019-09-28 MED ORDER — GLIPIZIDE 5 MG PO TABS: ORAL_TABLET | ORAL | 0 refills | Status: DC

## 2019-09-28 MED ORDER — DEXAMETHASONE 1 MG PO TABS: ORAL_TABLET | ORAL | 0 refills | Status: DC

## 2019-10-24 NOTE — Progress Notes (Signed)
FOLLOW UP  Assessment and Plan:   Essential hypertension - continue medications, DASH diet, exercise and monitor at home. Call if greater than 130/80.  -     CBC with Differential/Platelet -     COMPLETE METABOLIC PANEL WITH GFR -     TSH  Hyperlipidemia, mixed check lipids decrease fatty foods increase activity.  -     Lipid panel  T2_NIDDM Long discussion about hypoglycemia- with recent loss of his daughter he is not eating well and concentrating on other people right now, will try to stop the glipizide and just do the insulin with GLP, a low sugar is more dangerous than a high sugar.  He is not wanting to increase the ozempic to 1 mg at this time due to grief -     Hemoglobin A1c  Medication management -     Magnesium  Polycythemia -     CBC with Differential/Platelet  Diabetic polyneuropathy associated with type 2 diabetes mellitus (HCC) -     pregabalin (LYRICA) 100 MG capsule; Take 1 capsule (100 mg total) by mouth 2 (two) times daily. - complaining of not being able to sleep due to numbness tingling Will try lyrica rather than gabapentin  Chronic maxillary sinusitis -     amoxicillin-clavulanate (AUGMENTIN) 875-125 MG tablet; Take 1 tablet by mouth 2 (two) times daily for 10 days. - if not better after 10 day course of augmentin will refer to ENT  Acute pain of right shoulder -     dexamethasone (DECADRON) injection 10 mg - anterior shoulder pain, possible subacromial bursitis versus bicep tendon - discussed risk of rupture/infection and wishes to proceed, if not better will refer to ortho Verbal consent obtained, risk and benefits were addressed with patient. A trigger point injection was performed at the site of maximal tenderness using 1% plain Lidocaine and Dexamethasone. This was well tolerated, and followed by immediate relief of pain. Return precautions discussed with the patient.   Grief Daughter found non responsive 3 weeks ago, unknown cause, pending  autopsy results- States he has a good support system- may be in shock at this time but states he is making a lot of the decisions for his family and helping with his grand kids. Will contact us if he needs anything.   Continue diet and meds as discussed. Further disposition pending results of labs. Discussed med's effects and SE's.   Over 30 minutes of exam, counseling, chart review, and critical decision making was performed.   Future Appointments  Date Time Provider [place]  02/01/2020  4:15 PM Unk Pinto, MD [facility] None  08/27/2020  9:00 AM Unk Pinto, MD [facility] None    ----------------------------------------------------------------------------------------------------------------------  HPI 54 y.o. male  presents for 3 month follow up on hypertension, cholesterol, T2 diabetes, obesity and vitamin D deficiency.   Lost his daughter 3 weeks ago, laura bartnik is the wife.   BMI is Body mass index is 34.66 kg/m., he has been working on diet and exercise. Wt Readings from Last 3 Encounters:  10/26/19 245 lb (111.1 kg)  07/27/19 246 lb 3.2 oz (111.7 kg)  04/25/19 250 lb (113.4 kg)   His blood pressure has been controlled at home, today their BP is BP: (!) 140/92, has been doing well until recent loss of his daughter. He has not been sleeping well either, has had worsening numbness/tingling, gabapentin is hit or miss for helping.   Right shoulder pain x several weeks, no injury. Pain at anterior shoulder with  some pain down his arm, Denies weakness in his arms, trouble with grip, worse headache ever, fever, chills.    He does workout sporatically. He denies chest pain, shortness of breath, dizziness.  BMI is Body mass index is 34.66 kg/m., he is working on diet and exercise. Could not tolerate Topamax due to vision changes.  Wt Readings from Last 3 Encounters:  10/26/19 245 lb (111.1 kg)  07/27/19 246 lb 3.2 oz (111.7 kg)  04/25/19 250 lb (113.4 kg)     He is not on cholesterol medication and denies myalgias. His cholesterol is not at goal of less than 70. The cholesterol last visit was:   Lab Results  Component Value Date   CHOL 127 10/26/2019   HDL 20 (L) 10/26/2019   LDLCALC 70 10/26/2019   TRIG 307 (H) 10/26/2019   CHOLHDL 6.4 (H) 10/26/2019    He has been working on diet and exercise for T2 diabetes uncontrolled  He is on ARB, no CKD Hyperlipidemia, not on med, not at goal he is on fenofirbrate He is on gabapentin but states not helping He is on insulin 70/30: AS NEEDED will do 10-25 units a day.  Ozempic 0.77m q weekly Metformin 5034m4 a day He is on glipizide 5 mg 2-3 x a day, avoids at bed time.  Has had low sugar, lowest was 60 and he had hypoglycemic awareness.  denies hypoglycemia , increased appetite, nausea, paresthesia of the feet, polydipsia, polyuria, visual disturbances, vomiting and weight loss.  Uses contour testing strip He was Last A1C in the office was:  Lab Results  Component Value Date   HGBA1C 8.5 (H) 10/26/2019     Lab Results  Component Value Date   GFRNONAA 108 10/26/2019   Patient is on Vitamin D supplement and at goal at recent check:    Lab Results  Component Value Date   VD25OH 70 07/27/2019       Current Medications:  Current Outpatient Medications on File Prior to Visit  Medication Sig  . aspirin 81 MG tablet Take 81 mg by mouth daily.  . Marland Kitchentenolol (TENORMIN) 100 MG tablet Take 1 tablet Daily for BP  . Blood Glucose Monitoring Suppl (CONTOUR NEXT MONITOR) w/Device KIT 1 Device by Does not apply route 3 (three) times daily.  . Cholecalciferol (VITAMIN D) 2000 units CAPS Take 4 capsules by mouth daily.   . Cinnamon 500 MG capsule Take 500 mg by mouth 4 (four) times daily.  . fenofibrate 160 MG tablet Take 1 tablet Daily for Triglycerides (Blood Fats)  . gabapentin (NEURONTIN) 300 MG capsule Take 1 capsule (300 mg total) by mouth 4 (four) times daily.  . Marland KitchenlipiZIDE (GLUCOTROL) 5 MG tablet  Take 1 tablet 3 x /day with Meals for Diabetes  . glucose blood (CONTOUR NEXT TEST) test strip Test blood sugar  three times a day before meals  . ipratropium (ATROVENT) 0.06 % nasal spray Use 1 to 2 sprays each nostril 2 to 3 x /day as needed  . metFORMIN (GLUCOPHAGE-XR) 500 MG 24 hr tablet Take 2 tablets 2 x /Day with Food for Diabetes  . MICROLET LANCETS MISC USE TO TEST UP TO THREE TIMES DAILY AS DIRECTED  . olmesartan (BENICAR) 40 MG tablet Take 1/2 to 1 tablet at night for BP  . OVER THE COUNTER MEDICATION Novolin N 70/30-- injects 35 units in the AM and 30 units in the PM.  . Semaglutide,0.25 or 0.5MG/DOS, (OZEMPIC, 0.25 OR 0.5 MG/DOSE,) 2 MG/1.5ML SOPN  Inject 0.5 mg into the skin once a week.   No current facility-administered medications on file prior to visit.     Allergies:  Allergies  Allergen Reactions  . Topamax [Topiramate] Other (See Comments)    Vision changes/loss of vision  . Zocor [Simvastatin]     Headache  . Zoloft [Sertraline Hcl]     dysphona     Medical History:  Past Medical History:  Diagnosis Date  . Asthma   . Diabetes mellitus without complication ([place])   . Fatty liver disease, nonalcoholic   . GERD (gastroesophageal reflux disease)   . Gout   . Hyperlipidemia   . Hypertension   . Hypogonadism male   . Obesity   . Venous insufficiency    Family history- Reviewed and unchanged Social history- Reviewed and unchanged   Review of Systems:  Review of Systems  Constitutional: Negative for malaise/fatigue and weight loss.  HENT: Positive for congestion and sinus pain. Negative for hearing loss and tinnitus.   Eyes: Negative for blurred vision and double vision.  Respiratory: Negative for cough, shortness of breath and wheezing.   Cardiovascular: Negative for chest pain, palpitations, orthopnea, claudication and leg swelling.  Gastrointestinal: Negative for abdominal pain, blood in stool, constipation, diarrhea, heartburn, melena, nausea and  vomiting.  Genitourinary: Negative.   Musculoskeletal: Positive for joint pain. Negative for myalgias.  Skin: Negative for rash.  Neurological: Negative for dizziness, tingling, sensory change, weakness and headaches.  Endo/Heme/Allergies: Negative for polydipsia.  Psychiatric/Behavioral: Negative.   All other systems reviewed and are negative.   Physical Exam: BP (!) 140/92   Pulse 97   Temp (!) 97.5 F (36.4 C)   Wt 245 lb (111.1 kg)   SpO2 98%   BMI 34.66 kg/m  Wt Readings from Last 3 Encounters:  10/26/19 245 lb (111.1 kg)  07/27/19 246 lb 3.2 oz (111.7 kg)  04/25/19 250 lb (113.4 kg)   General Appearance: Well nourished, in no apparent distress. Eyes: PERRLA, EOMs, conjunctiva no swelling or erythema Sinuses: No Frontal/maxillary tenderness ENT/Mouth: Ext aud canals clear, TMs without erythema, bulging. No erythema, swelling, or exudate on post pharynx.  Tonsils not swollen or erythematous. Hearing normal.  Neck: Supple, thyroid normal.  Respiratory: Respiratory effort normal, BS equal bilaterally without rales, rhonchi, wheezing or stridor.  Cardio: RRR with no MRGs. Brisk peripheral pulses without edema.  Abdomen: Soft, + BS.  Non tender, no guarding, rebound, hernias, masses. Lymphatics: Non tender without lymphadenopathy.  Musculoskeletal: Full ROM, 5/5 strength, Normal gait, FROM right shoulder with anterior pain at subacromial bursa, good distal neurovascular exam and good grip/strength distal.  Skin: Warm, dry without rashes, lesions, ecchymosis.  Neuro: Cranial nerves intact. No cerebellar symptoms.  Psych: Awake and oriented X 3, normal affect, Insight and Judgment appropriate.    Vicie Mutters, PA-C 8:12 AM [facility]

## 2019-10-26 ENCOUNTER — Encounter: Payer: Self-pay | Admitting: Physician Assistant

## 2019-10-26 ENCOUNTER — Other Ambulatory Visit: Payer: Self-pay

## 2019-10-26 ENCOUNTER — Ambulatory Visit: Payer: BC Managed Care – PPO | Admitting: Adult Health

## 2019-10-26 ENCOUNTER — Ambulatory Visit: Payer: BC Managed Care – PPO | Admitting: Physician Assistant

## 2019-10-26 VITALS — BP 140/92 | HR 97 | Temp 97.5°F | Wt 245.0 lb

## 2019-10-26 DIAGNOSIS — M25511 Pain in right shoulder: Secondary | ICD-10-CM | POA: Diagnosis not present

## 2019-10-26 DIAGNOSIS — I10 Essential (primary) hypertension: Secondary | ICD-10-CM | POA: Diagnosis not present

## 2019-10-26 DIAGNOSIS — Z79899 Other long term (current) drug therapy: Secondary | ICD-10-CM

## 2019-10-26 DIAGNOSIS — E119 Type 2 diabetes mellitus without complications: Secondary | ICD-10-CM | POA: Diagnosis not present

## 2019-10-26 DIAGNOSIS — E782 Mixed hyperlipidemia: Secondary | ICD-10-CM

## 2019-10-26 DIAGNOSIS — E349 Endocrine disorder, unspecified: Secondary | ICD-10-CM | POA: Diagnosis not present

## 2019-10-26 DIAGNOSIS — E1142 Type 2 diabetes mellitus with diabetic polyneuropathy: Secondary | ICD-10-CM

## 2019-10-26 DIAGNOSIS — F4321 Adjustment disorder with depressed mood: Secondary | ICD-10-CM

## 2019-10-26 DIAGNOSIS — D751 Secondary polycythemia: Secondary | ICD-10-CM

## 2019-10-26 DIAGNOSIS — J320 Chronic maxillary sinusitis: Secondary | ICD-10-CM

## 2019-10-26 MED ORDER — DEXAMETHASONE SODIUM PHOSPHATE 100 MG/10ML IJ SOLN: 10.0000 mg | Freq: Once | INTRAMUSCULAR | Status: DC

## 2019-10-26 MED ORDER — AMOXICILLIN-POT CLAVULANATE 875-125 MG PO TABS: 1.0000 | ORAL_TABLET | Freq: Two times a day (BID) | ORAL | 0 refills | Status: AC

## 2019-10-26 MED ORDER — PREGABALIN 100 MG PO CAPS: 100.0000 mg | ORAL_CAPSULE | Freq: Two times a day (BID) | ORAL | 2 refills | Status: DC

## 2019-10-26 NOTE — Patient Instructions (Addendum)
Joint Steroid Injection A joint steroid injection is a procedure to relieve swelling and pain in a joint. Steroids are medicines that reduce inflammation. In this procedure, your health care provider uses a syringe and a needle to inject a steroid medicine into a painful and inflamed joint. A pain-relieving medicine (anesthetic) may be injected along with the steroid. In some cases, your health care provider may use an imaging technique such as ultrasound or fluoroscopy to guide the injection. Joints that are often treated with steroid injections include the knee, shoulder, hip, and spine. These injections may also be used in the elbow, ankle, and joints of the hands or feet. You may have joint steroid injections as part of your treatment for inflammation caused by:  Gout.  Rheumatoid arthritis.  Advanced wear-and-tear arthritis (osteoarthritis).  Tendinitis.  Bursitis. Joint steroid injections may be repeated, but having them too often can damage a joint or the skin over the joint. You should not have joint steroid injections less than 6 weeks apart or more than four times a year. Tell a health care provider about:  Any allergies you have.  All medicines you are taking, including vitamins, herbs, eye drops, creams, and over-the-counter medicines.  Any problems you or family members have had with anesthetic medicines.  Any blood disorders you have.  Any surgeries you have had.  Any medical conditions you have.  Whether you are pregnant or may be pregnant. What are the risks? Generally, this is a safe treatment. However, problems may occur, including:  Infection.  Bleeding.  Allergic reactions to medicines.  Damage to the joint or tissues around the joint.  Thinning of skin or loss of skin color over the joint.  Temporary flushing of the face or chest.  Temporary increase in pain.  Temporary increase in blood sugar.  Failure to relieve inflammation or pain. What  happens before the treatment?  You may have imaging tests of your joint.  Ask your health care provider about: ? Changing or stopping your regular medicines. This is especially important if you are taking diabetes medicines or blood thinners. ? Taking medicines such as aspirin and ibuprofen. These medicines can thin your blood. Do not take these medicines unless your health care provider tells you to take them. ? Taking over-the-counter medicines, vitamins, herbs, and supplements.  Ask your health care provider if you can drive yourself home after the procedure. What happens during the treatment?   Your health care provider will position you for the injection and locate the injection site over your joint.  The skin over the joint will be cleaned with a germ-killing soap.  Your health care provider may: ? Spray a numbing solution (topical anesthetic) over the injection site. ? Inject a local anesthetic under the skin above your joint.  The needle will be placed through your skin into your joint. Your health care provider may use imaging to guide the needle to the right spot for the injection. If imaging is used, a special contrast dye may be injected to confirm that the needle is in the correct location.  The steroid medicine will be injected into your joint.  Anesthetic may be injected along with the steroid. This may be a medicine that relieves pain for a short time (short-acting anesthetic) or for a longer time (long-acting anesthetic).  The needle will be removed, and an adhesive bandage (dressing) will be placed over the injection site. The procedure may vary among health care providers and hospitals. What can I   expect after the treatment?  You will be able to go home after the treatment.  It is normal to feel slight flushing for a few days after the injection.  After the treatment, it is common to have an increase in joint pain after the anesthetic has worn off. This may  happen about an hour after a short-acting anesthetic or about 8 hours after a longer-acting anesthetic.  You should begin to feel relief from joint pain and swelling after 24 to 48 hours. Follow these instructions at home: Injection site care  Leave the adhesive dressing over your injection site in place until your health care provider says you can remove it.  Check your injection site every day for signs of infection. Check for: ? Redness, swelling, or pain. ? Fluid or blood. ? Warmth. ? Pus or a bad smell. Activity  Return to your normal activities as told by your health care provider. Ask your health care provider what activities are safe for you. You may be asked to limit activities that put stress on the joint for a few days.  Do joint exercises as told by your health care provider.  Do not take baths, swim, or use a hot tub until your health care provider approves. Managing pain, stiffness, and swelling   If directed, put ice on the joint. ? Put ice in a plastic bag. ? Place a towel between your skin and the bag. ? Leave the ice on for 20 minutes, 2-3 times a day.  Raise (elevate) your joint above the level of your heart when you are sitting or lying down. General instructions  Take over-the-counter and prescription medicines only as told by your health care provider.  Do not use any products that contain nicotine or tobacco, such as cigarettes, e-cigarettes, and chewing tobacco. These can delay joint healing. If you need help quitting, ask your health care provider.  If you have diabetes, be aware that your blood sugar may be slightly elevated for several days after the injection.  Keep all follow-up visits as told by your health care provider. This is important. Contact a health care provider if you have:  Chills or a fever.  Any signs of infection at your injection site.  Increased pain or swelling or no relief after 2 days. Summary  A joint steroid injection  is a treatment to relieve pain and swelling in a joint.  Steroids are medicines that reduce inflammation. Your health care provider may add an anesthetic along with the steroid.  You may have joint steroid injections as part of your arthritis treatment.  Joint steroid injections may be repeated, but having them too often can damage a joint or the skin over the joint.  Contact your health care provider if you have a fever, chills, or signs of infection or if you get no relief from joint pain or swelling. This information is not intended to replace advice given to you by your health care provider. Make sure you discuss any questions you have with your health care provider. Document Revised: 03/23/2018 Document Reviewed: 03/23/2018 Elsevier Patient Education  2020 [employer].   We are starting you on a mix of insulin- it is called 70/30 mixed insulin The 30 of insulin is fast acting and MUST be taken with a meal The 70 of the insulin is an intermediate dose and will last for longer to cover you during the day.   Please check your sugars at home: check it in in the morning  before food Check it before your dinner And for a while check it before bed while we are adjusting your medications  IT IS VERY IMPORTANT TO BRING THIS LOG IN to your office visits.   We will start you on a dose of pre breakfast/morning insulin titrate based off pre-dinner glucose reading. Can increase 1-2 unit of insulin in the morning every 2 days if your predinner glucose is above 180.   And a night time/predinner dose of insulin should be less than your morning insulin dose please increase this insulin by 1 unit every 2 days if your glucose in the morning is not below 140  We will likely need to increase this but we will do this slowly because a low sugar is much more dangerous than a high sugar.    Your brain needs 2 things, oxygen and sugar, so lets make sure it gets both.  Please remember only take the  insulin WITH food   if you are sick or unable to eat DO NOT take your insulin   If at any time you have a question or concern, call the office or message [place] in [place].      Bad carbs also include fruit juice, alcohol, and sweet tea. These are empty calories that do not signal to your brain that you are full.   Please remember the good carbs are still carbs which convert into sugar. So please measure them out no more than 1/2-1 cup of rice, oatmeal, pasta, and beans  Veggies are however free foods! Pile them on.   Not all fruit is created equal. Please see the list below, the fruit at the bottom is higher in sugars than the fruit at the top. Please avoid all dried fruits.

## 2019-10-27 LAB — COMPLETE METABOLIC PANEL WITH GFR
AG Ratio: 1.8 (calc) (ref 1.0–2.5)
ALT: 49 U/L — ABNORMAL HIGH (ref 9–46)
AST: 44 U/L — ABNORMAL HIGH (ref 10–35)
Albumin: 4.3 g/dL (ref 3.6–5.1)
Alkaline phosphatase (APISO): 48 U/L (ref 35–144)
BUN/Creatinine Ratio: 19 (calc) (ref 6–22)
BUN: 13 mg/dL (ref 7–25)
CO2: 27 mmol/L (ref 20–32)
Calcium: 10.3 mg/dL (ref 8.6–10.3)
Chloride: 102 mmol/L (ref 98–110)
Creat: 0.69 mg/dL — ABNORMAL LOW (ref 0.70–1.33)
GFR, Est African American: 125 mL/min/{1.73_m2} (ref >=60)
GFR, Est Non African American: 108 mL/min/{1.73_m2} (ref >=60)
Globulin: 2.4 g/dL (calc) (ref 1.9–3.7)
Glucose, Bld: 225 mg/dL — ABNORMAL HIGH (ref 65–99)
Potassium: 5.2 mmol/L (ref 3.5–5.3)
Sodium: 137 mmol/L (ref 135–146)
Total Bilirubin: 0.4 mg/dL (ref 0.2–1.2)
Total Protein: 6.7 g/dL (ref 6.1–8.1)

## 2019-10-27 LAB — CBC WITH DIFFERENTIAL/PLATELET
Absolute Monocytes: 447 cells/uL (ref 200–950)
Basophils Absolute: 31 cells/uL (ref 0–200)
Basophils Relative: 0.6 %
Eosinophils Absolute: 447 cells/uL (ref 15–500)
Eosinophils Relative: 8.6 %
HCT: 46 % (ref 38.5–50.0)
Hemoglobin: 15.3 g/dL (ref 13.2–17.1)
Lymphs Abs: 1768 cells/uL (ref 850–3900)
MCH: 28.2 pg (ref 27.0–33.0)
MCHC: 33.3 g/dL (ref 32.0–36.0)
MCV: 84.7 fL (ref 80.0–100.0)
MPV: 10.4 fL (ref 7.5–12.5)
Monocytes Relative: 8.6 %
Neutro Abs: 2506 cells/uL (ref 1500–7800)
Neutrophils Relative %: 48.2 %
Platelets: 191 10*3/uL (ref 140–400)
RBC: 5.43 10*6/uL (ref 4.20–5.80)
RDW: 13.6 % (ref 11.0–15.0)
Total Lymphocyte: 34 %
WBC: 5.2 10*3/uL (ref 3.8–10.8)

## 2019-10-27 LAB — LIPID PANEL
Cholesterol: 127 mg/dL (ref <200)
HDL: 20 mg/dL — ABNORMAL LOW (ref >=40)
LDL Cholesterol (Calc): 70 mg/dL (calc)
Non-HDL Cholesterol (Calc): 107 mg/dL (calc) (ref <130)
Total CHOL/HDL Ratio: 6.4 (calc) — ABNORMAL HIGH (ref <5.0)
Triglycerides: 307 mg/dL — ABNORMAL HIGH (ref <150)

## 2019-10-27 LAB — HEMOGLOBIN A1C
Hgb A1c MFr Bld: 8.5 % of total Hgb — ABNORMAL HIGH (ref <5.7)
Mean Plasma Glucose: 197 (calc)
eAG (mmol/L): 10.9 (calc)

## 2019-10-27 LAB — TSH: TSH: 2.25 mIU/L (ref 0.40–4.50)

## 2019-10-27 LAB — MAGNESIUM: Magnesium: 1.7 mg/dL (ref 1.5–2.5)

## 2019-11-11 ENCOUNTER — Other Ambulatory Visit: Payer: Self-pay | Admitting: Internal Medicine

## 2019-11-11 DIAGNOSIS — E119 Type 2 diabetes mellitus without complications: Secondary | ICD-10-CM

## 2019-11-11 MED ORDER — METFORMIN HCL ER 500 MG PO TB24: ORAL_TABLET | ORAL | 3 refills | Status: DC

## 2019-12-14 DIAGNOSIS — E113211 Type 2 diabetes mellitus with mild nonproliferative diabetic retinopathy with macular edema, right eye: Secondary | ICD-10-CM | POA: Diagnosis not present

## 2019-12-14 DIAGNOSIS — H02055 Trichiasis without entropian left lower eyelid: Secondary | ICD-10-CM | POA: Diagnosis not present

## 2020-02-01 ENCOUNTER — Encounter: Payer: Self-pay | Admitting: Internal Medicine

## 2020-02-01 ENCOUNTER — Other Ambulatory Visit: Payer: Self-pay

## 2020-02-01 ENCOUNTER — Ambulatory Visit: Payer: BC Managed Care – PPO | Admitting: Internal Medicine

## 2020-02-01 VITALS — BP 102/70 | HR 84 | Temp 97.8°F | Resp 16 | Ht 70.5 in | Wt 242.8 lb

## 2020-02-01 DIAGNOSIS — M1000 Idiopathic gout, unspecified site: Secondary | ICD-10-CM | POA: Diagnosis not present

## 2020-02-01 DIAGNOSIS — E1165 Type 2 diabetes mellitus with hyperglycemia: Secondary | ICD-10-CM

## 2020-02-01 DIAGNOSIS — I10 Essential (primary) hypertension: Secondary | ICD-10-CM | POA: Diagnosis not present

## 2020-02-01 DIAGNOSIS — E1169 Type 2 diabetes mellitus with other specified complication: Secondary | ICD-10-CM

## 2020-02-01 DIAGNOSIS — E785 Hyperlipidemia, unspecified: Secondary | ICD-10-CM

## 2020-02-01 DIAGNOSIS — Z79899 Other long term (current) drug therapy: Secondary | ICD-10-CM | POA: Diagnosis not present

## 2020-02-01 DIAGNOSIS — E559 Vitamin D deficiency, unspecified: Secondary | ICD-10-CM

## 2020-02-01 NOTE — Patient Instructions (Signed)

## 2020-02-01 NOTE — Progress Notes (Signed)
History of Present Illness:      This very nice 54 y.o.  [name] presents for 6 month follow up with HTN, HLD, T2_NIDDM and Vitamin D Deficiency.       Patient is treated for HTN (1999)  & BP has been controlled at home. Today's BP is at goal - 102/70. Patient has had no complaints of any cardiac type chest pain, palpitations, dyspnea / orthopnea / PND, dizziness, claudication, or dependent edema.      Hyperlipidemia is controlled with diet & meds. Patient denies myalgias or other med SE's. Last Lipids were at goal except elevated Trig's:  Lab Results  Component Value Date   CHOL 127 10/26/2019   HDL 20 (L) 10/26/2019   LDLCALC 70 10/26/2019   TRIG 307 (H) 10/26/2019   CHOLHDL 6.4 (H) 10/26/2019    Also, the patient has  Morbid Obesity (BMI 34+) and history of  poorly compliant & poorly controlled T2_DM (2004)  and has had no symptoms of reactive hypoglycemia, diabetic polys, paresthesias or visual blurring.  Last A1c was not at goal:  Lab Results  Component Value Date   HGBA1C 8.5 (H) 10/26/2019       Further, the patient also has history of Vitamin D Deficiency ("15" / 2009) and supplements vitamin D without any suspected side-effects. Last vitamin D was at goal:  Lab Results  Component Value Date   VD25OH 41 07/27/2019    Current Outpatient Medications on File Prior to Visit  Medication Sig  . aspirin 81 MG tablet Take 81 mg by mouth daily.  Marland Kitchen atenolol (TENORMIN) 100 MG tablet Take 1 tablet Daily for BP  . Blood Glucose Monitoring Suppl (CONTOUR NEXT MONITOR) w/Device KIT 1 Device by Does not apply route 3 (three) times daily.  . Cholecalciferol (VITAMIN D) 2000 units CAPS Take 4 capsules by mouth daily.   . Cinnamon 500 MG capsule Take 500 mg by mouth 4 (four) times daily.  . fenofibrate 160 MG tablet Take 1 tablet Daily for Triglycerides (Blood Fats)  . glipiZIDE (GLUCOTROL) 5 MG tablet Take 1 tablet 3 x /day with Meals for Diabetes  . glucose blood (CONTOUR NEXT  TEST) test strip Test blood sugar  three times a day before meals  . ipratropium (ATROVENT) 0.06 % nasal spray Use 1 to 2 sprays each nostril 2 to 3 x /day as needed  . metFORMIN (GLUCOPHAGE-XR) 500 MG 24 hr tablet Take 2 tablets 2 x /Day with Food for Diabetes  . MICROLET LANCETS MISC USE TO TEST UP TO THREE TIMES DAILY AS DIRECTED  . olmesartan (BENICAR) 40 MG tablet Take 1/2 to 1 tablet at night for BP  . OVER THE COUNTER MEDICATION Novolin N 70/30-- injects 35 units in the AM and 30 units in the PM.  . pregabalin (LYRICA) 100 MG capsule Take 1 capsule (100 mg total) by mouth 2 (two) times daily.  . Semaglutide,0.25 or 0.5MG/DOS, (OZEMPIC, 0.25 OR 0.5 MG/DOSE,) 2 MG/1.5ML SOPN Inject 0.5 mg into the skin once a week.   No current facility-administered medications on file prior to visit.    Allergies  Allergen Reactions  . Topamax [Topiramate] Other (See Comments)    Vision changes/loss of vision  . Zocor [Simvastatin]     Headache  . Zoloft [Sertraline Hcl]     dysphona    PMHx:   Past Medical History:  Diagnosis Date  . Asthma   . Diabetes mellitus without complication ([place])   .  Fatty liver disease, nonalcoholic   . GERD (gastroesophageal reflux disease)   . Gout   . Hyperlipidemia   . Hypertension   . Hypogonadism male   . Obesity   . Venous insufficiency     Immunization History  Administered Date(s) Administered  . PPD Test 02/17/2014, 03/08/2015, 03/27/2016, 05/13/2017, 06/21/2018, 07/27/2019  . Pneumococcal Polysaccharide-23 07/27/2019  . Pneumococcal-Unspecified 02/09/2013  . Tdap 02/09/2013    Past Surgical History:  Procedure Laterality Date  . ANTERIOR CRUCIATE LIGAMENT REPAIR Left 2001  . CYSTOSCOPY  1993  . ESOPHAGOGASTRODUODENOSCOPY  1995, 2002   gastritis, esophagititis  . KNEE ARTHROSCOPY Right 2002, 1995    FHx:    Reviewed / unchanged  SHx:    Reviewed / unchanged   Systems Review:  Constitutional: Denies fever, chills, wt changes,  headaches, insomnia, fatigue, night sweats, change in appetite. Eyes: Denies redness, blurred vision, diplopia, discharge, itchy, watery eyes.  ENT: Denies discharge, congestion, post nasal drip, epistaxis, sore throat, earache, hearing loss, dental pain, tinnitus, vertigo, sinus pain, snoring.  CV: Denies chest pain, palpitations, irregular heartbeat, syncope, dyspnea, diaphoresis, orthopnea, PND, claudication or edema. Respiratory: denies cough, dyspnea, DOE, pleurisy, hoarseness, laryngitis, wheezing.  Gastrointestinal: Denies dysphagia, odynophagia, heartburn, reflux, water brash, abdominal pain or cramps, nausea, vomiting, bloating, diarrhea, constipation, hematemesis, melena, hematochezia  or hemorrhoids. Genitourinary: Denies dysuria, frequency, urgency, nocturia, hesitancy, discharge, hematuria or flank pain. Musculoskeletal: Denies arthralgias, myalgias, stiffness, jt. swelling, pain, limping or strain/sprain.  Skin: Denies pruritus, rash, hives, warts, acne, eczema or change in skin lesion(s). Neuro: No weakness, tremor, incoordination, spasms, paresthesia or pain. Psychiatric: Denies confusion, memory loss or sensory loss. Endo: Denies change in weight, skin or hair change.  Heme/Lymph: No excessive bleeding, bruising or enlarged lymph nodes.  Physical Exam  BP 102/70   Pulse 84   Temp 97.8 F (36.6 C)   Resp 16   Ht 5' 10.5" (1.791 m)   Wt 242 lb 12.8 oz (110.1 kg)   BMI 34.35 kg/m   Appears  well nourished, well groomed  and in no distress.  Eyes: PERRLA, EOMs, conjunctiva no swelling or erythema. Sinuses: No frontal/maxillary tenderness ENT/Mouth: EAC's clear, TM's nl w/o erythema, bulging. Nares clear w/o erythema, swelling, exudates. Oropharynx clear without erythema or exudates. Oral hygiene is good. Tongue normal, non obstructing. Hearing intact.  Neck: Supple. Thyroid not palpable. Car 2+/2+ without bruits, nodes or JVD. Chest: Respirations nl with BS clear & equal  w/o rales, rhonchi, wheezing or stridor.  Cor: Heart sounds normal w/ regular rate and rhythm without sig. murmurs, gallops, clicks or rubs. Peripheral pulses normal and equal  without edema.  Abdomen: Soft & bowel sounds normal. Non-tender w/o guarding, rebound, hernias, masses or organomegaly.  Lymphatics: Unremarkable.  Musculoskeletal: Full ROM all peripheral extremities, joint stability, 5/5 strength and normal gait.  Skin: Warm, dry without exposed rashes, lesions or ecchymosis apparent.  Neuro: Cranial nerves intact, reflexes equal bilaterally. Sensory-motor testing grossly intact. Tendon reflexes grossly intact.  Pysch: Alert & oriented x 3.  Insight and judgement nl & appropriate. No ideations.  Assessment and Plan:  - Continue medication, monitor blood pressure at home.  - Continue DASH diet.  Reminder to go to the ER if any CP,  SOB, nausea, dizziness, severe HA, changes vision/speech.  1. Essential hypertension  - CBC with Differential/Platelet - COMPLETE METABOLIC PANEL WITH GFR - Magnesium - TSH  2. Hyperlipidemia associated with type 2 diabetes mellitus ([place])  - Continue diet/meds, exercise,& lifestyle modifications.  -  Continue monitor periodic cholesterol/liver & renal functions   - Lipid panel - TSH  3. Poorly controlled type 2 diabetes mellitus (HCC)  - Continue diet, exercise  - Lifestyle modifications.  - Monitor appropriate labs.  - Insulin, random - Hemoglobin A1c  4. Vitamin D deficiency  - Continue supplementation.  - VITAMIN D 25 Hydroxy  5. Idiopathic gout  - Uric acid  6. Medication management  - VITAMIN D 25 Hydroxy  - Uric acid - Hemoglobin A1c        Discussed  regular exercise, BP monitoring, weight control to achieve/maintain BMI less than 25 and discussed med and SE's. Recommended labs to assess and monitor clinical status with further disposition pending results of labs.  I discussed the assessment and treatment plan with the  patient. The patient was provided an opportunity to ask questions and all were answered. The patient agreed with the plan and demonstrated an understanding of the instructions.  I provided over 30 minutes of exam, counseling, chart review and  complex critical decision making.   Kirtland Bouchard, MD

## 2020-02-02 LAB — COMPLETE METABOLIC PANEL WITH GFR
AG Ratio: 1.6 (calc) (ref 1.0–2.5)
ALT: 30 U/L (ref 9–46)
AST: 22 U/L (ref 10–35)
Albumin: 4.4 g/dL (ref 3.6–5.1)
Alkaline phosphatase (APISO): 60 U/L (ref 35–144)
BUN: 25 mg/dL (ref 7–25)
CO2: 26 mmol/L (ref 20–32)
Calcium: 9.9 mg/dL (ref 8.6–10.3)
Chloride: 103 mmol/L (ref 98–110)
Creat: 1 mg/dL (ref 0.70–1.33)
GFR, Est African American: 98 mL/min/{1.73_m2} (ref >=60)
GFR, Est Non African American: 85 mL/min/{1.73_m2} (ref >=60)
Globulin: 2.7 g/dL (calc) (ref 1.9–3.7)
Glucose, Bld: 207 mg/dL — ABNORMAL HIGH (ref 65–99)
Potassium: 4.8 mmol/L (ref 3.5–5.3)
Sodium: 138 mmol/L (ref 135–146)
Total Bilirubin: 0.4 mg/dL (ref 0.2–1.2)
Total Protein: 7.1 g/dL (ref 6.1–8.1)

## 2020-02-02 LAB — CBC WITH DIFFERENTIAL/PLATELET
Absolute Monocytes: 439 cells/uL (ref 200–950)
Basophils Absolute: 8 cells/uL (ref 0–200)
Basophils Relative: 0.1 %
Eosinophils Absolute: 293 cells/uL (ref 15–500)
Eosinophils Relative: 3.8 %
HCT: 46.5 % (ref 38.5–50.0)
Hemoglobin: 15.6 g/dL (ref 13.2–17.1)
Lymphs Abs: 2895 cells/uL (ref 850–3900)
MCH: 28.6 pg (ref 27.0–33.0)
MCHC: 33.5 g/dL (ref 32.0–36.0)
MCV: 85.2 fL (ref 80.0–100.0)
MPV: 10.1 fL (ref 7.5–12.5)
Monocytes Relative: 5.7 %
Neutro Abs: 4066 cells/uL (ref 1500–7800)
Neutrophils Relative %: 52.8 %
Platelets: 232 10*3/uL (ref 140–400)
RBC: 5.46 10*6/uL (ref 4.20–5.80)
RDW: 12.9 % (ref 11.0–15.0)
Total Lymphocyte: 37.6 %
WBC: 7.7 10*3/uL (ref 3.8–10.8)

## 2020-02-02 LAB — HEMOGLOBIN A1C
Hgb A1c MFr Bld: 8.2 % of total Hgb — ABNORMAL HIGH (ref <5.7)
Mean Plasma Glucose: 189 (calc)
eAG (mmol/L): 10.4 (calc)

## 2020-02-02 LAB — LIPID PANEL
Cholesterol: 126 mg/dL (ref <200)
HDL: 20 mg/dL — ABNORMAL LOW (ref >=40)
LDL Cholesterol (Calc): 69 mg/dL (calc)
Non-HDL Cholesterol (Calc): 106 mg/dL (calc) (ref <130)
Total CHOL/HDL Ratio: 6.3 (calc) — ABNORMAL HIGH (ref <5.0)
Triglycerides: 280 mg/dL — ABNORMAL HIGH (ref <150)

## 2020-02-02 LAB — URIC ACID: Uric Acid, Serum: 7.4 mg/dL (ref 4.0–8.0)

## 2020-02-02 LAB — VITAMIN D 25 HYDROXY (VIT D DEFICIENCY, FRACTURES): Vit D, 25-Hydroxy: 66 ng/mL (ref 30–100)

## 2020-02-02 LAB — TSH: TSH: 2.95 mIU/L (ref 0.40–4.50)

## 2020-02-02 LAB — INSULIN, RANDOM: Insulin: 50.7 u[IU]/mL — ABNORMAL HIGH

## 2020-02-02 LAB — MAGNESIUM: Magnesium: 1.7 mg/dL (ref 1.5–2.5)

## 2020-02-02 NOTE — Progress Notes (Signed)
=========================================================  -    Total Chol = 126     and LDL Chol = 69     - Both Excellent   - Very low risk for Heart Attack  / Stroke =============================================================  -  But................. Triglycerides (   280   ) or fats in blood are too high  (goal is less than 150)    - Recommend avoid fried & greasy foods,  sweets / candy,   - Avoid white rice  (brown or wild rice or Quinoa is OK),   - Avoid white potatoes  (sweet potatoes are OK)   - Avoid anything made from white flour  - bagels, doughnuts, rolls, buns, biscuits, white and   wheat breads, pizza crust and traditional  pasta made of white flour & egg white  - (vegetarian pasta or spinach or wheat pasta is OK).    - Multi-grain bread is OK - like multi-grain flat bread or  sandwich thins.   - Avoid alcohol in excess.   - Exercise is also important. =========================================================  -  Vitamin D = 66 - Excellent  =========================================================  -  A1c = 8.2%  - still way too high   - May need to start Insulin if you don't do a better job with diet & get your A1c down to goal (less  than 6.0%)   Being diabetic has a  300% increased risk for heart attack,  stroke, cancer, and alzheimer- type vascular dementia.   It is very important that you work harder with diet by  avoiding all foods that are white except chicken,   fish & calliflower.  - Avoid white rice  (brown & wild rice is OK),   - Avoid white potatoes  (sweet potatoes in moderation is OK),   White bread or wheat bread or anything made out of   white flour like bagels, donuts, rolls, buns, biscuits, cakes,  - pastries, cookies, pizza crust, and pasta (made from  white flour & egg whites)   - vegetarian pasta or spinach or wheat pasta is OK.  - Multigrain breads like Arnold's, Pepperidge Farm or   multigrain sandwich thins or  high fiber breads like   [place] bread or "Dave's Killer" breads that are  4 to 5 grams fiber per slice !  are best.    Diet, exercise and weight loss can reverse and cure  diabetes in the early stages.    - Diet, exercise and weight loss is very important in the   control and prevention of complications of diabetes which  affects every system in your body, ie.   -Brain - dementia/stroke,  - eyes - glaucoma/blindness,  - heart - heart attack/heart failure,  - kidneys - dialysis,  - stomach - gastric paralysis,  - intestines - malabsorption,  - nerves - severe painful neuritis,  - circulation - gangrene & loss of a leg(s)  - and finally  . . . . . . . . . . . . . . . . . .    - cancer and Alzheimers. =========================================================  -  All Else - CBC - Kidneys - Electrolytes -  Liver - Magnesium & Thyroid  - all  Normal / OK =========================================================

## 2020-02-14 ENCOUNTER — Other Ambulatory Visit: Payer: Self-pay | Admitting: Internal Medicine

## 2020-02-14 DIAGNOSIS — I10 Essential (primary) hypertension: Secondary | ICD-10-CM

## 2020-02-14 DIAGNOSIS — E782 Mixed hyperlipidemia: Secondary | ICD-10-CM

## 2020-02-14 MED ORDER — FENOFIBRATE 160 MG PO TABS: ORAL_TABLET | ORAL | 3 refills | Status: DC

## 2020-02-14 MED ORDER — ATENOLOL 100 MG PO TABS: ORAL_TABLET | ORAL | 3 refills | Status: DC

## 2020-02-17 ENCOUNTER — Other Ambulatory Visit: Payer: Self-pay | Admitting: Internal Medicine

## 2020-02-17 DIAGNOSIS — E1142 Type 2 diabetes mellitus with diabetic polyneuropathy: Secondary | ICD-10-CM

## 2020-02-17 MED ORDER — PREGABALIN 100 MG PO CAPS: ORAL_CAPSULE | ORAL | 1 refills | Status: DC

## 2020-03-23 ENCOUNTER — Other Ambulatory Visit: Payer: Self-pay

## 2020-03-23 ENCOUNTER — Other Ambulatory Visit: Payer: Self-pay | Admitting: Family Medicine

## 2020-03-23 ENCOUNTER — Ambulatory Visit: Payer: Self-pay

## 2020-03-23 DIAGNOSIS — M25511 Pain in right shoulder: Secondary | ICD-10-CM

## 2020-04-04 ENCOUNTER — Other Ambulatory Visit: Payer: Self-pay | Admitting: Internal Medicine

## 2020-04-04 DIAGNOSIS — E1165 Type 2 diabetes mellitus with hyperglycemia: Secondary | ICD-10-CM

## 2020-04-04 MED ORDER — OZEMPIC (0.25 OR 0.5 MG/DOSE) 2 MG/1.5ML ~~LOC~~ SOPN: PEN_INJECTOR | SUBCUTANEOUS | 3 refills | Status: DC

## 2020-04-08 ENCOUNTER — Other Ambulatory Visit: Payer: Self-pay | Admitting: Internal Medicine

## 2020-04-08 DIAGNOSIS — E1165 Type 2 diabetes mellitus with hyperglycemia: Secondary | ICD-10-CM

## 2020-04-08 MED ORDER — OZEMPIC (1 MG/DOSE) 2 MG/1.5ML ~~LOC~~ SOPN: 1.0000 mg | PEN_INJECTOR | SUBCUTANEOUS | 1 refills | Status: DC

## 2020-04-23 DIAGNOSIS — Z20822 Contact with and (suspected) exposure to covid-19: Secondary | ICD-10-CM | POA: Diagnosis not present

## 2020-04-26 DIAGNOSIS — Z20828 Contact with and (suspected) exposure to other viral communicable diseases: Secondary | ICD-10-CM | POA: Diagnosis not present

## 2020-04-30 ENCOUNTER — Other Ambulatory Visit: Payer: Self-pay | Admitting: Physician Assistant

## 2020-04-30 DIAGNOSIS — E119 Type 2 diabetes mellitus without complications: Secondary | ICD-10-CM

## 2020-04-30 DIAGNOSIS — Z87891 Personal history of nicotine dependence: Secondary | ICD-10-CM

## 2020-04-30 DIAGNOSIS — U071 COVID-19: Secondary | ICD-10-CM

## 2020-04-30 DIAGNOSIS — E669 Obesity, unspecified: Secondary | ICD-10-CM

## 2020-04-30 DIAGNOSIS — I10 Essential (primary) hypertension: Secondary | ICD-10-CM

## 2020-04-30 NOTE — Progress Notes (Signed)
I connected by phone with Shaun [name] on 04/30/2020 at 3:35 PM to discuss the potential use of a new treatment for mild to moderate COVID-19 viral infection in non-hospitalized patients.  This patient is a 54 y.o. male that meets the FDA criteria for Emergency Use Authorization of COVID monoclonal antibody casirivimab/imdevimab.  Has a (+) direct SARS-CoV-2 viral test result  Has mild or moderate COVID-19   Is NOT hospitalized due to COVID-19  Is within 10 days of symptom onset  Has at least one of the high risk factor(s) for progression to severe COVID-19 and/or hospitalization as defined in EUA.  Specific high risk criteria : BMI > 25, Diabetes and Cardiovascular disease or hypertension   I have spoken and communicated the following to the patient or parent/caregiver regarding COVID monoclonal antibody treatment:  1. FDA has authorized the emergency use for the treatment of mild to moderate COVID-19 in adults and pediatric patients with positive results of direct SARS-CoV-2 viral testing who are 107 years of age and older weighing at least 40 kg, and who are at high risk for progressing to severe COVID-19 and/or hospitalization.  2. The significant known and potential risks and benefits of COVID monoclonal antibody, and the extent to which such potential risks and benefits are unknown.  3. Information on available alternative treatments and the risks and benefits of those alternatives, including clinical trials.  4. Patients treated with COVID monoclonal antibody should continue to self-isolate and use infection control measures (e.g., wear mask, isolate, social distance, avoid sharing personal items, clean and disinfect "high touch" surfaces, and frequent handwashing) according to CDC guidelines.   5. The patient or parent/caregiver has the option to accept or refuse COVID monoclonal antibody treatment.  After reviewing this information with the patient, the patient has agreed to  receive one of the available covid 19 monoclonal antibodies and will be provided an appropriate fact sheet prior to infusion.  Sx onset 9/22. Set up for infusion on 9/28 @ 10:30am. Directions given to [facility]. Pt is aware that insurance will be charged an infusion fee. Pt is unvaccinated.   Cline Crock 04/30/2020 3:35 PM

## 2020-05-01 ENCOUNTER — Ambulatory Visit (HOSPITAL_COMMUNITY)
Admission: RE | Admit: 2020-05-01 | Discharge: 2020-05-01 | Disposition: A | Discharge status: Home / Self Care | Payer: BC Managed Care – PPO | Source: Ambulatory Visit | Attending: Pulmonary Disease | Admitting: Pulmonary Disease

## 2020-05-01 DIAGNOSIS — Z87891 Personal history of nicotine dependence: Secondary | ICD-10-CM

## 2020-05-01 DIAGNOSIS — E119 Type 2 diabetes mellitus without complications: Secondary | ICD-10-CM

## 2020-05-01 DIAGNOSIS — I10 Essential (primary) hypertension: Secondary | ICD-10-CM | POA: Insufficient documentation

## 2020-05-01 DIAGNOSIS — E669 Obesity, unspecified: Secondary | ICD-10-CM

## 2020-05-01 DIAGNOSIS — U071 COVID-19: Secondary | ICD-10-CM

## 2020-05-01 MED ORDER — DIPHENHYDRAMINE HCL 50 MG/ML IJ SOLN: 50.0000 mg | Freq: Once | INTRAMUSCULAR | Status: DC | PRN

## 2020-05-01 MED ORDER — EPINEPHRINE 0.3 MG/0.3ML IJ SOAJ: 0.3000 mg | Freq: Once | INTRAMUSCULAR | Status: DC | PRN

## 2020-05-01 MED ORDER — METHYLPREDNISOLONE SODIUM SUCC 125 MG IJ SOLR: 125.0000 mg | Freq: Once | INTRAMUSCULAR | Status: DC | PRN

## 2020-05-01 MED ORDER — SODIUM CHLORIDE 0.9 % IV SOLN: INTRAVENOUS | Status: DC | PRN

## 2020-05-01 MED ORDER — SODIUM CHLORIDE 0.9 % IV SOLN
1200.0000 mg | Freq: Once | INTRAVENOUS | Status: AC
  Administered 2020-05-01: 1200 mg via INTRAVENOUS

## 2020-05-01 MED ORDER — ALBUTEROL SULFATE HFA 108 (90 BASE) MCG/ACT IN AERS: 2.0000 | INHALATION_SPRAY | Freq: Once | RESPIRATORY_TRACT | Status: DC | PRN

## 2020-05-01 MED ORDER — FAMOTIDINE IN NACL 20-0.9 MG/50ML-% IV SOLN: 20.0000 mg | Freq: Once | INTRAVENOUS | Status: DC | PRN

## 2020-05-01 NOTE — Progress Notes (Signed)
  Diagnosis: COVID-19  Physician: Wright    Procedure: [facility] Med: casirivimab\imdevimab infusion - Provided patient with casirivimab\imdevimab fact sheet for patients, parents and caregivers prior to infusion.  Complications: No immediate complications noted.  Discharge: Discharged home   Eloina Ergle S 05/01/2020   

## 2020-05-01 NOTE — Discharge Instructions (Signed)

## 2020-05-04 ENCOUNTER — Other Ambulatory Visit: Payer: Self-pay | Admitting: Internal Medicine

## 2020-05-04 MED ORDER — BENZONATATE 200 MG PO CAPS: ORAL_CAPSULE | ORAL | 1 refills | Status: DC

## 2020-05-04 MED ORDER — DEXAMETHASONE 4 MG PO TABS: ORAL_TABLET | ORAL | 0 refills | Status: DC

## 2020-05-09 ENCOUNTER — Ambulatory Visit: Payer: BC Managed Care – PPO | Admitting: Physician Assistant

## 2020-05-09 ENCOUNTER — Other Ambulatory Visit: Payer: Self-pay | Admitting: Internal Medicine

## 2020-05-09 ENCOUNTER — Other Ambulatory Visit: Payer: Self-pay

## 2020-05-09 ENCOUNTER — Encounter: Payer: Self-pay | Admitting: Physician Assistant

## 2020-05-09 VITALS — BP 122/84 | HR 76 | Temp 97.6°F | Wt 224.0 lb

## 2020-05-09 DIAGNOSIS — E1169 Type 2 diabetes mellitus with other specified complication: Secondary | ICD-10-CM | POA: Diagnosis not present

## 2020-05-09 DIAGNOSIS — E1142 Type 2 diabetes mellitus with diabetic polyneuropathy: Secondary | ICD-10-CM

## 2020-05-09 DIAGNOSIS — I10 Essential (primary) hypertension: Secondary | ICD-10-CM

## 2020-05-09 DIAGNOSIS — E1165 Type 2 diabetes mellitus with hyperglycemia: Secondary | ICD-10-CM | POA: Diagnosis not present

## 2020-05-09 DIAGNOSIS — E785 Hyperlipidemia, unspecified: Secondary | ICD-10-CM | POA: Diagnosis not present

## 2020-05-09 DIAGNOSIS — E119 Type 2 diabetes mellitus without complications: Secondary | ICD-10-CM

## 2020-05-09 DIAGNOSIS — Z79899 Other long term (current) drug therapy: Secondary | ICD-10-CM | POA: Diagnosis not present

## 2020-05-09 MED ORDER — GLIPIZIDE 5 MG PO TABS: ORAL_TABLET | ORAL | 0 refills | Status: DC

## 2020-05-09 NOTE — Progress Notes (Signed)
FOLLOW UP  Assessment and Plan:  COVID 19  Has recovered, took decardon, no SOB, no CP  Essential hypertension - continue medications, DASH diet, exercise and monitor at home. Call if greater than 130/80.  -     CBC with Differential/Platelet -     COMPLETE METABOLIC PANEL WITH GFR -     TSH  Hyperlipidemia, mixed check lipids decrease fatty foods increase activity.  -     Lipid panel  T2_NIDDM Long discussion about hypoglycemia-  Continue ozempic  Has been elevated due to decadron/COVID- has been having to do more insulin- decreasing at this time Continue insulin as needed but be mindful of low sugars -     Hemoglobin A1c  Medication management -     Magnesium  Polycythemia -     CBC with Differential/Platelet  Diabetic polyneuropathy associated with type 2 diabetes mellitus (HCC) -     pregabalin (LYRICA) 100 MG capsule; Take 1 capsule (100 mg total) by mouth 2 (two) times daily. - continue lyrica  Continue diet and meds as discussed. Further disposition pending results of labs. Discussed med's effects and SE's.   Over 30 minutes of exam, counseling, chart review, and critical decision making was performed.   Future Appointments  Date Time Provider [place]  08/27/2020  9:00 AM Unk Pinto, MD [facility] None    ----------------------------------------------------------------------------------------------------------------------  HPI 54 y.o. male  presents for 3 month follow up on hypertension, cholesterol, T2 diabetes, obesity and vitamin D deficiency.   Lost his daughter March 2021, his ex wife is Engineer, manufacturing systems.  Patient had COVID  19 tested positive 09/23, started to have symptoms 09/22, got monoclonal infusion 09/28 and got prednisone taper for cough. His sugars were in the 500's with the prednisone but now he states this AM it was 119. Denies CP and SOB.   BMI is Body mass index is 31.69 kg/m., he has been working on diet and exercise. Wt  Readings from Last 3 Encounters:  05/09/20 224 lb (101.6 kg)  02/01/20 242 lb 12.8 oz (110.1 kg)  10/26/19 245 lb (111.1 kg)   His blood pressure has been controlled at home, today their BP is BP: 122/84  BMI is Body mass index is 31.69 kg/m., he is working on diet and exercise. Could not tolerate Topamax due to vision changes.  Wt Readings from Last 3 Encounters:  05/09/20 224 lb (101.6 kg)  02/01/20 242 lb 12.8 oz (110.1 kg)  10/26/19 245 lb (111.1 kg)    He is not on cholesterol medication other than fenofibrate and denies myalgias. His cholesterol is at goal of less than 70. The cholesterol last visit was:   Lab Results  Component Value Date   CHOL 126 02/01/2020   HDL 20 (L) 02/01/2020   LDLCALC 69 02/01/2020   TRIG 280 (H) 02/01/2020   CHOLHDL 6.3 (H) 02/01/2020    He has been working on diet and exercise for T2 diabetes DEE at [place] this year He is on ARB, no CKD Hyperlipidemia, not on med, at goal he is on fenofibrate He is on lyrica 100 takes once a day that helps- just in AM that helps He is on insulin 70/30: AS NEEDED has only been taking lately since taking the prednisone due to COVID, has only been taking 10-15 units Ozempic 61m q weekly Metformin 5013m4 a day He is on glipizide 5 mg 2-3 x a day, avoids at bed time.  Has had low sugars preCOVID.  denies hypoglycemia , increased appetite, nausea, paresthesia of the feet, polydipsia, polyuria, visual disturbances, vomiting and weight loss.  Uses contour testing strip He was Last A1C in the office was:  Lab Results  Component Value Date   HGBA1C 8.2 (H) 02/01/2020     Lab Results  Component Value Date   GFRNONAA 85 02/01/2020   Patient is on Vitamin D supplement and at goal at recent check:    Lab Results  Component Value Date   VD25OH 66 02/01/2020       Current Medications:   Current Outpatient Medications (Endocrine & Metabolic):  .  dexamethasone (DECADRON) 4 MG tablet, Take 1 tab 3 x day -  2 days, then 2 x day - 2 days, then 1 tab daily .  glipiZIDE (GLUCOTROL) 5 MG tablet, Take 1 tablet 3 x /day with Meals for Diabetes .  metFORMIN (GLUCOPHAGE-XR) 500 MG 24 hr tablet, Take 2 tablets 2 x /Day with Food for Diabetes .  Semaglutide, 1 MG/DOSE, (OZEMPIC, 1 MG/DOSE,) 2 MG/1.5ML SOPN, Inject 0.75 mLs (1 mg total) into the skin once a week.  Current Outpatient Medications (Cardiovascular):  .  atenolol (TENORMIN) 100 MG tablet, Take 1 tablet Daily for BP .  fenofibrate 160 MG tablet, Take 1 tablet Daily for Triglycerides (Blood Fats) .  olmesartan (BENICAR) 40 MG tablet, Take 1/2 to 1 tablet at night for BP  Current Outpatient Medications (Respiratory):  .  benzonatate (TESSALON) 200 MG capsule, Take 1 perle 3 x / day to prevent cough .  ipratropium (ATROVENT) 0.06 % nasal spray, Use 1 to 2 sprays each nostril 2 to 3 x /day as needed  Current Outpatient Medications (Analgesics):  .  aspirin 81 MG tablet, Take 81 mg by mouth daily.   Current Outpatient Medications (Other):  .  Blood Glucose Monitoring Suppl (CONTOUR NEXT MONITOR) w/Device KIT, 1 Device by Does not apply route 3 (three) times daily. .  Cholecalciferol (VITAMIN D) 2000 units CAPS, Take 4 capsules by mouth daily.  .  Cinnamon 500 MG capsule, Take 500 mg by mouth 4 (four) times daily. Marland Kitchen  glucose blood (CONTOUR NEXT TEST) test strip, Test blood sugar  three times a day before meals .  MICROLET LANCETS MISC, USE TO TEST UP TO THREE TIMES DAILY AS DIRECTED .  OVER THE COUNTER MEDICATION, Novolin N 70/30-- injects 35 units in the AM and 30 units in the PM. .  pregabalin (LYRICA) 100 MG capsule, Take 1 capsule 2 x /day for Pain   Allergies:  Allergies  Allergen Reactions  . Topamax [Topiramate] Other (See Comments)    Vision changes/loss of vision  . Zocor [Simvastatin]     Headache  . Zoloft [Sertraline Hcl]     dysphona     Medical History:  Past Medical History:  Diagnosis Date  . Asthma   . Diabetes  mellitus without complication ([place])   . Fatty liver disease, nonalcoholic   . GERD (gastroesophageal reflux disease)   . Gout   . Hyperlipidemia   . Hypertension   . Hypogonadism male   . Obesity   . Venous insufficiency    Family history- Reviewed and unchanged Social history- Reviewed and unchanged   Review of Systems:  Review of Systems  Constitutional: Negative for malaise/fatigue and weight loss.  HENT: Positive for congestion and sinus pain. Negative for hearing loss and tinnitus.   Eyes: Negative for blurred vision and double vision.  Respiratory: Negative for cough, shortness of breath  and wheezing.   Cardiovascular: Negative for chest pain, palpitations, orthopnea, claudication and leg swelling.  Gastrointestinal: Negative for abdominal pain, blood in stool, constipation, diarrhea, heartburn, melena, nausea and vomiting.  Genitourinary: Negative.   Musculoskeletal: Positive for joint pain. Negative for myalgias.  Skin: Negative for rash.  Neurological: Negative for dizziness, tingling, sensory change, weakness and headaches.  Endo/Heme/Allergies: Negative for polydipsia.  Psychiatric/Behavioral: Negative.   All other systems reviewed and are negative.   Physical Exam: BP 122/84   Pulse 76   Temp 97.6 F (36.4 C)   Wt 224 lb (101.6 kg)   SpO2 98%   BMI 31.69 kg/m  Wt Readings from Last 3 Encounters:  05/09/20 224 lb (101.6 kg)  02/01/20 242 lb 12.8 oz (110.1 kg)  10/26/19 245 lb (111.1 kg)   General Appearance: Well nourished, in no apparent distress. Eyes: PERRLA, EOMs, conjunctiva no swelling or erythema Sinuses: No Frontal/maxillary tenderness ENT/Mouth: Ext aud canals clear, TMs without erythema, bulging. No erythema, swelling, or exudate on post pharynx.  Tonsils not swollen or erythematous. Hearing normal.  Neck: Supple, thyroid normal.  Respiratory: Respiratory effort normal, BS equal bilaterally without rales, rhonchi, wheezing or stridor.  Cardio:  RRR with no MRGs. Brisk peripheral pulses with mild edema left leg more than right, no warmth, tenderness, erythema. Neg homen's.  Abdomen: Soft, + BS.  Non tender, no guarding, rebound, hernias, masses. Lymphatics: Non tender without lymphadenopathy.  Musculoskeletal: Full ROM, 5/5 strength, Normal gait, good distal neurovascular exam and good grip/strength distal.  Skin: Warm, dry without rashes, lesions, ecchymosis.  Neuro: Cranial nerves intact. No cerebellar symptoms.  Psych: Awake and oriented X 3, normal affect, Insight and Judgment appropriate.    Vicie Mutters, PA-C 3:53 PM [facility]

## 2020-05-09 NOTE — Patient Instructions (Signed)
Bad carbs also include fruit juice, alcohol, and sweet tea. These are empty calories that do not signal to your brain that you are full.   Please remember the good carbs are still carbs which convert into sugar. So please measure them out no more than 1/2-1 cup of rice, oatmeal, pasta, and beans  Veggies are however free foods! Pile them on.   Not all fruit is created equal. Please see the list below, the fruit at the bottom is higher in sugars than the fruit at the top. Please avoid all dried fruits.      Hypoglycemia Hypoglycemia occurs when the level of sugar (glucose) in the blood is too low. Hypoglycemia can happen in people who do or do not have diabetes. It can develop quickly, and it can be a medical emergency. For most people with diabetes, a blood glucose level below 70 mg/dL (3.9 mmol/L) is considered hypoglycemia. Glucose is a type of sugar that provides the body's main source of energy. Certain hormones (insulin and glucagon) control the level of glucose in the blood. Insulin lowers blood glucose, and glucagon raises blood glucose. Hypoglycemia can result from having too much insulin in the bloodstream, or from not eating enough food that contains glucose. You may also have reactive hypoglycemia, which happens within 4 hours after eating a meal. What are the causes? Hypoglycemia occurs most often in people who have diabetes and may be caused by:  Diabetes medicine.  Not eating enough, or not eating often enough.  Increased physical activity.  Drinking alcohol on an empty stomach. If you do not have diabetes, hypoglycemia may be caused by:  A tumor in the pancreas.  Not eating enough, or not eating for long periods at a time (fasting).  A severe infection or illness.  Certain medicines. What increases the risk? Hypoglycemia is more likely to develop in:  People who have diabetes and take medicines to lower blood glucose.  People who abuse alcohol.  People who  have a severe illness. What are the signs or symptoms? Mild symptoms Mild hypoglycemia may not cause any symptoms. If you do have symptoms, they may include:  Hunger.  Anxiety.  Sweating and feeling clammy.  Dizziness or feeling light-headed.  Sleepiness.  Nausea.  Increased heart rate.  Headache.  Blurry vision.  Irritability.  Tingling or numbness around the mouth, lips, or tongue.  A change in coordination.  Restless sleep. Moderate symptoms Moderate hypoglycemia can cause:  Mental confusion and poor judgment.  Behavior changes.  Weakness.  Irregular heartbeat. Severe symptoms Severe hypoglycemia is a medical emergency. It can cause:  Fainting.  Seizures.  Loss of consciousness (coma).  Death. How is this diagnosed? Hypoglycemia is diagnosed with a blood test to measure your blood glucose level. This blood test is done while you are having symptoms. Your health care provider may also do a physical exam and review your medical history. How is this treated? This condition can often be treated by immediately eating or drinking something that contains sugar, such as:  Fruit juice, 4-6 oz (120-150 mL).  Regular soda (not diet soda), 4-6 oz (120-150 mL).  Low-fat milk, 4 oz (120 mL).  Several pieces of hard candy.  Sugar or honey, 1 Tbsp (15 mL). Treating hypoglycemia if you have diabetes If you are alert and able to swallow safely, follow the 15:15 rule:  Take 15 grams of a rapid-acting carbohydrate. Talk with your health care provider about how much you should take.  Rapid-acting options include: ? Glucose pills (take 15 grams). ? 6-8 pieces of hard candy. ? 4-6 oz (120-150 mL) of fruit juice. ? 4-6 oz (120-150 mL) of regular (not diet) soda. ? 1 Tbsp (15 mL) honey or sugar.  Check your blood glucose 15 minutes after you take the carbohydrate.  If the repeat blood glucose level is still at or below 70 mg/dL (3.9 mmol/L), take 15 grams of a  carbohydrate again.  If your blood glucose level does not increase above 70 mg/dL (3.9 mmol/L) after 3 tries, seek emergency medical care.  After your blood glucose level returns to normal, eat a meal or a snack within 1 hour.  Treating severe hypoglycemia Severe hypoglycemia is when your blood glucose level is at or below 54 mg/dL (3 mmol/L). Severe hypoglycemia is a medical emergency. Get medical help right away. If you have severe hypoglycemia and you cannot eat or drink, you may need an injection of glucagon. A family member or close friend should learn how to check your blood glucose and how to give you a glucagon injection. Ask your health care provider if you need to have an emergency glucagon injection kit available. Severe hypoglycemia may need to be treated in a hospital. The treatment may include getting glucose through an IV. You may also need treatment for the cause of your hypoglycemia. Follow these instructions at home:  General instructions  Take over-the-counter and prescription medicines only as told by your health care provider.  Monitor your blood glucose as told by your health care provider.  Limit alcohol intake to no more than 1 drink a day for nonpregnant women and 2 drinks a day for men. One drink equals 12 oz of beer (355 mL), 5 oz of wine (148 mL), or 1 oz of hard liquor (44 mL).  Keep all follow-up visits as told by your health care provider. This is important. If you have diabetes:  Always have a rapid-acting carbohydrate snack with you to treat low blood glucose.  Follow your diabetes management plan as directed. Make sure you: ? Know the symptoms of hypoglycemia. It is important to treat it right away to prevent it from becoming severe. ? Take your medicines as directed. ? Follow your exercise plan. ? Follow your meal plan. Eat on time, and do not skip meals. ? Check your blood glucose as often as directed. Always check before and after  exercise. ? Follow your sick day plan whenever you cannot eat or drink normally. Make this plan in advance with your health care provider.  Share your diabetes management plan with people in your workplace, school, and household.  Check your urine for ketones when you are ill and as told by your health care provider.  Carry a medical alert card or wear medical alert jewelry. Contact a health care provider if:  You have problems keeping your blood glucose in your target range.  You have frequent episodes of hypoglycemia. Get help right away if:  You continue to have hypoglycemia symptoms after eating or drinking something containing glucose.  Your blood glucose is at or below 54 mg/dL (3 mmol/L).  You have a seizure.  You faint. These symptoms may represent a serious problem that is an emergency. Do not wait to see if the symptoms will go away. Get medical help right away. Call your local emergency services (911 in the [place] Summary  Hypoglycemia occurs when the level of sugar (glucose) in the blood is too low.  Hypoglycemia  can happen in people who do or do not have diabetes. It can develop quickly, and it can be a medical emergency.  Make sure you know the symptoms of hypoglycemia and how to treat it.  Always have a rapid-acting carbohydrate snack with you to treat low blood sugar. This information is not intended to replace advice given to you by your health care provider. Make sure you discuss any questions you have with your health care provider. Document Revised: 01/11/2018 Document Reviewed: 08/24/2015 Elsevier Patient Education  2020 [employer].

## 2020-05-10 ENCOUNTER — Other Ambulatory Visit: Payer: Self-pay | Admitting: Internal Medicine

## 2020-05-10 DIAGNOSIS — E1165 Type 2 diabetes mellitus with hyperglycemia: Secondary | ICD-10-CM

## 2020-05-10 LAB — CBC WITH DIFFERENTIAL/PLATELET
Absolute Monocytes: 488 cells/uL (ref 200–950)
Basophils Absolute: 43 cells/uL (ref 0–200)
Basophils Relative: 0.7 %
Eosinophils Absolute: 12 cells/uL — ABNORMAL LOW (ref 15–500)
Eosinophils Relative: 0.2 %
HCT: 47.4 % (ref 38.5–50.0)
Hemoglobin: 15.8 g/dL (ref 13.2–17.1)
Lymphs Abs: 1214 cells/uL (ref 850–3900)
MCH: 27.9 pg (ref 27.0–33.0)
MCHC: 33.3 g/dL (ref 32.0–36.0)
MCV: 83.6 fL (ref 80.0–100.0)
MPV: 10 fL (ref 7.5–12.5)
Monocytes Relative: 8 %
Neutro Abs: 4343 cells/uL (ref 1500–7800)
Neutrophils Relative %: 71.2 %
Platelets: 345 10*3/uL (ref 140–400)
RBC: 5.67 10*6/uL (ref 4.20–5.80)
RDW: 13 % (ref 11.0–15.0)
Total Lymphocyte: 19.9 %
WBC: 6.1 10*3/uL (ref 3.8–10.8)

## 2020-05-10 LAB — LIPID PANEL
Cholesterol: 138 mg/dL (ref <200)
HDL: 22 mg/dL — ABNORMAL LOW (ref >=40)
Non-HDL Cholesterol (Calc): 116 mg/dL (calc) (ref <130)
Total CHOL/HDL Ratio: 6.3 (calc) — ABNORMAL HIGH (ref <5.0)
Triglycerides: 601 mg/dL — ABNORMAL HIGH (ref <150)

## 2020-05-10 LAB — COMPLETE METABOLIC PANEL WITH GFR
AG Ratio: 1.6 (calc) (ref 1.0–2.5)
ALT: 39 U/L (ref 9–46)
AST: 26 U/L (ref 10–35)
Albumin: 3.9 g/dL (ref 3.6–5.1)
Alkaline phosphatase (APISO): 43 U/L (ref 35–144)
BUN/Creatinine Ratio: 28 (calc) — ABNORMAL HIGH (ref 6–22)
BUN: 27 mg/dL — ABNORMAL HIGH (ref 7–25)
CO2: 26 mmol/L (ref 20–32)
Calcium: 9.8 mg/dL (ref 8.6–10.3)
Chloride: 96 mmol/L — ABNORMAL LOW (ref 98–110)
Creat: 0.98 mg/dL (ref 0.70–1.33)
GFR, Est African American: 101 mL/min/{1.73_m2} (ref >=60)
GFR, Est Non African American: 87 mL/min/{1.73_m2} (ref >=60)
Globulin: 2.4 g/dL (calc) (ref 1.9–3.7)
Glucose, Bld: 395 mg/dL — ABNORMAL HIGH (ref 65–99)
Potassium: 5.2 mmol/L (ref 3.5–5.3)
Sodium: 134 mmol/L — ABNORMAL LOW (ref 135–146)
Total Bilirubin: 0.7 mg/dL (ref 0.2–1.2)
Total Protein: 6.3 g/dL (ref 6.1–8.1)

## 2020-05-10 LAB — TSH: TSH: 0.68 mIU/L (ref 0.40–4.50)

## 2020-05-10 LAB — HEMOGLOBIN A1C
Hgb A1c MFr Bld: 8.8 % of total Hgb — ABNORMAL HIGH (ref <5.7)
Mean Plasma Glucose: 206 (calc)
eAG (mmol/L): 11.4 (calc)

## 2020-05-10 LAB — MAGNESIUM: Magnesium: 1.8 mg/dL (ref 1.5–2.5)

## 2020-05-10 MED ORDER — GLIPIZIDE 5 MG PO TABS: ORAL_TABLET | ORAL | 0 refills | Status: DC

## 2020-06-08 ENCOUNTER — Other Ambulatory Visit: Payer: Self-pay

## 2020-06-08 ENCOUNTER — Emergency Department (HOSPITAL_BASED_OUTPATIENT_CLINIC_OR_DEPARTMENT_OTHER): Payer: BC Managed Care – PPO

## 2020-06-08 ENCOUNTER — Encounter (HOSPITAL_BASED_OUTPATIENT_CLINIC_OR_DEPARTMENT_OTHER): Payer: Self-pay | Admitting: Emergency Medicine

## 2020-06-08 ENCOUNTER — Emergency Department (HOSPITAL_BASED_OUTPATIENT_CLINIC_OR_DEPARTMENT_OTHER)
Admission: EM | Admit: 2020-06-08 | Discharge: 2020-06-09 | Disposition: A | Discharge status: Home / Self Care | Payer: BC Managed Care – PPO | Attending: Emergency Medicine | Admitting: Emergency Medicine

## 2020-06-08 DIAGNOSIS — Z7982 Long term (current) use of aspirin: Secondary | ICD-10-CM | POA: Diagnosis not present

## 2020-06-08 DIAGNOSIS — N200 Calculus of kidney: Secondary | ICD-10-CM

## 2020-06-08 DIAGNOSIS — N132 Hydronephrosis with renal and ureteral calculous obstruction: Secondary | ICD-10-CM | POA: Diagnosis not present

## 2020-06-08 DIAGNOSIS — K219 Gastro-esophageal reflux disease without esophagitis: Secondary | ICD-10-CM | POA: Insufficient documentation

## 2020-06-08 DIAGNOSIS — M47816 Spondylosis without myelopathy or radiculopathy, lumbar region: Secondary | ICD-10-CM | POA: Diagnosis not present

## 2020-06-08 DIAGNOSIS — Z87891 Personal history of nicotine dependence: Secondary | ICD-10-CM | POA: Diagnosis not present

## 2020-06-08 DIAGNOSIS — Z7984 Long term (current) use of oral hypoglycemic drugs: Secondary | ICD-10-CM | POA: Insufficient documentation

## 2020-06-08 DIAGNOSIS — K760 Fatty (change of) liver, not elsewhere classified: Secondary | ICD-10-CM | POA: Diagnosis not present

## 2020-06-08 DIAGNOSIS — J45909 Unspecified asthma, uncomplicated: Secondary | ICD-10-CM | POA: Insufficient documentation

## 2020-06-08 DIAGNOSIS — Z79899 Other long term (current) drug therapy: Secondary | ICD-10-CM | POA: Diagnosis not present

## 2020-06-08 DIAGNOSIS — E1142 Type 2 diabetes mellitus with diabetic polyneuropathy: Secondary | ICD-10-CM | POA: Insufficient documentation

## 2020-06-08 DIAGNOSIS — I10 Essential (primary) hypertension: Secondary | ICD-10-CM | POA: Insufficient documentation

## 2020-06-08 DIAGNOSIS — R109 Unspecified abdominal pain: Secondary | ICD-10-CM | POA: Diagnosis not present

## 2020-06-08 DIAGNOSIS — I700 Atherosclerosis of aorta: Secondary | ICD-10-CM | POA: Diagnosis not present

## 2020-06-08 LAB — URINALYSIS, ROUTINE W REFLEX MICROSCOPIC
Bilirubin Urine: NEGATIVE
Glucose, UA: 100 mg/dL — AB
Hgb urine dipstick: NEGATIVE
Ketones, ur: NEGATIVE mg/dL
Leukocytes,Ua: NEGATIVE
Nitrite: NEGATIVE
Protein, ur: NEGATIVE mg/dL
Specific Gravity, Urine: 1.02 (ref 1.005–1.030)
pH: 7 (ref 5.0–8.0)

## 2020-06-08 LAB — CBC WITH DIFFERENTIAL/PLATELET
Abs Immature Granulocytes: 0.01 10*3/uL (ref 0.00–0.07)
Basophils Absolute: 0 10*3/uL (ref 0.0–0.1)
Basophils Relative: 0 %
Eosinophils Absolute: 0.4 10*3/uL (ref 0.0–0.5)
Eosinophils Relative: 5 %
HCT: 43.7 % (ref 39.0–52.0)
Hemoglobin: 14.7 g/dL (ref 13.0–17.0)
Immature Granulocytes: 0 %
Lymphocytes Relative: 46 %
Lymphs Abs: 4.3 10*3/uL — ABNORMAL HIGH (ref 0.7–4.0)
MCH: 27.6 pg (ref 26.0–34.0)
MCHC: 33.6 g/dL (ref 30.0–36.0)
MCV: 82 fL (ref 80.0–100.0)
Monocytes Absolute: 0.9 10*3/uL (ref 0.1–1.0)
Monocytes Relative: 9 %
Neutro Abs: 3.7 10*3/uL (ref 1.7–7.7)
Neutrophils Relative %: 40 %
Platelets: 310 10*3/uL (ref 150–400)
RBC: 5.33 MIL/uL (ref 4.22–5.81)
RDW: 13.6 % (ref 11.5–15.5)
WBC: 9.4 10*3/uL (ref 4.0–10.5)
nRBC: 0 % (ref 0.0–0.2)

## 2020-06-08 LAB — COMPREHENSIVE METABOLIC PANEL
ALT: 48 U/L — ABNORMAL HIGH (ref 0–44)
AST: 37 U/L (ref 15–41)
Albumin: 4.3 g/dL (ref 3.5–5.0)
Alkaline Phosphatase: 47 U/L (ref 38–126)
Anion gap: 11 (ref 5–15)
BUN: 18 mg/dL (ref 6–20)
CO2: 27 mmol/L (ref 22–32)
Calcium: 9.7 mg/dL (ref 8.9–10.3)
Chloride: 100 mmol/L (ref 98–111)
Creatinine, Ser: 1 mg/dL (ref 0.61–1.24)
GFR, Estimated: >60 mL/min (ref >60)
Glucose, Bld: 219 mg/dL — ABNORMAL HIGH (ref 70–99)
Potassium: 4.8 mmol/L (ref 3.5–5.1)
Sodium: 138 mmol/L (ref 135–145)
Total Bilirubin: 0.8 mg/dL (ref 0.3–1.2)
Total Protein: 7.3 g/dL (ref 6.5–8.1)

## 2020-06-08 LAB — LIPASE, BLOOD: Lipase: 57 U/L — ABNORMAL HIGH (ref 11–51)

## 2020-06-08 MED ORDER — FENTANYL CITRATE (PF) 100 MCG/2ML IJ SOLN
50.0000 ug | INTRAMUSCULAR | Status: DC | PRN
  Administered 2020-06-08: 50 ug via INTRAVENOUS
  Filled 2020-06-08: qty 2

## 2020-06-08 MED ORDER — ONDANSETRON HCL 4 MG/2ML IJ SOLN
4.0000 mg | Freq: Once | INTRAMUSCULAR | Status: AC | PRN
  Administered 2020-06-08: 4 mg via INTRAVENOUS
  Filled 2020-06-08: qty 2

## 2020-06-08 NOTE — ED Notes (Signed)
ED Provider at bedside. 

## 2020-06-08 NOTE — ED Triage Notes (Signed)
Pt reports sudden onset of left flank pain that is now radiating to abd. Denies any urinary symptoms.

## 2020-06-09 MED ORDER — KETOROLAC TROMETHAMINE 15 MG/ML IJ SOLN
15.0000 mg | Freq: Once | INTRAMUSCULAR | Status: AC
  Administered 2020-06-09: 15 mg via INTRAVENOUS
  Filled 2020-06-09: qty 1

## 2020-06-09 MED ORDER — NAPROXEN 500 MG PO TABS: 500.0000 mg | ORAL_TABLET | Freq: Two times a day (BID) | ORAL | 0 refills | Status: DC | PRN

## 2020-06-09 MED ORDER — OXYCODONE-ACETAMINOPHEN 5-325 MG PO TABS
1.0000 | ORAL_TABLET | Freq: Once | ORAL | Status: AC
  Administered 2020-06-09: 1 via ORAL
  Filled 2020-06-09: qty 1

## 2020-06-09 MED ORDER — OXYCODONE-ACETAMINOPHEN 5-325 MG PO TABS
1.0000 | ORAL_TABLET | ORAL | 0 refills | Status: AC | PRN
Start: 2020-06-09 — End: 2020-06-12

## 2020-06-09 MED ORDER — TAMSULOSIN HCL 0.4 MG PO CAPS: 0.4000 mg | ORAL_CAPSULE | Freq: Every day | ORAL | 0 refills | Status: AC

## 2020-06-09 NOTE — ED Provider Notes (Signed)
[facility] EMERGENCY DEPARTMENT Provider Note   CSN: [id number] Arrival date & time: 06/08/20  2159     History Chief Complaint  Patient presents with  . Abdominal Pain    Shaun [name] is a 54 y.o. male.  Presents to ER with concern for flank pain.  Reports that he had sudden onset of left flank pain this evening, severe, sharp, stabbing, radiating left flank to left lower abdomen.  Denies prior history of kidney stones.  No pain with urination, no nausea or vomiting, no fevers.  HPI     Past Medical History:  Diagnosis Date  . Asthma   . Diabetes mellitus without complication ([place])   . Fatty liver disease, nonalcoholic   . GERD (gastroesophageal reflux disease)   . Gout   . Hyperlipidemia   . Hypertension   . Hypogonadism male   . Obesity   . Venous insufficiency     Patient Active Problem List   Diagnosis Date Noted  . Diabetic polyneuropathy associated with type 2 diabetes mellitus ([place]) 05/09/2020  . Hyperlipidemia associated with type 2 diabetes mellitus ([place]) 05/09/2020  . Former smoker 03/17/2018  . FH: hypertension 03/17/2018  . Polycythemia 09/01/2017  . Obesity (BMI 30.0-34.9) 03/08/2015  . Vitamin D deficiency 08/18/2013  . Medication management 08/07/2013  . Essential hypertension   . Hyperlipidemia, mixed   . T2_NIDDM   . GERD   . Asthma   . Idiopathic gout   . Testosterone deficiency     Past Surgical History:  Procedure Laterality Date  . ANTERIOR CRUCIATE LIGAMENT REPAIR Left 2001  . CYSTOSCOPY  1993  . ESOPHAGOGASTRODUODENOSCOPY  1995, 2002   gastritis, esophagititis  . KNEE ARTHROSCOPY Right 2002, 1995       Family History  Problem Relation Age of Onset  . Diabetes Mother   . Hypertension Mother   . Gout Mother   . COPD Father   . Cancer Sister 48       appendix  . Gout Brother   . Hyperlipidemia Brother   . Hypertension Brother   . Hypertension Brother   . Hyperlipidemia Brother   . Gout Brother   .  Diabetes Brother   . COPD Sister     Social History   Tobacco Use  . Smoking status: Former Smoker    Packs/day: 1.00    Years: 15.00    Pack years: 15.00    Types: Cigarettes    Quit date: 08/07/2000    Years since quitting: 19.8  . Smokeless tobacco: Never Used  Substance Use Topics  . Alcohol use: Yes    Comment: social  . Drug use: No    Home Medications Prior to Admission medications   Medication Sig Start Date End Date Taking? Authorizing Provider  aspirin 81 MG tablet Take 81 mg by mouth daily.    [provider]  atenolol (TENORMIN) 100 MG tablet Take 1 tablet Daily for BP 02/14/20   Unk Pinto, MD  benzonatate (TESSALON) 200 MG capsule Take 1 perle 3 x / day to prevent cough 05/04/20   Unk Pinto, MD  Blood Glucose Monitoring Suppl (CONTOUR NEXT MONITOR) w/Device KIT 1 Device by Does not apply route 3 (three) times daily. 04/01/19   Unk Pinto, MD  Cholecalciferol (VITAMIN D) 2000 units CAPS Take 4 capsules by mouth daily.     [provider]  Cinnamon 500 MG capsule Take 500 mg by mouth 4 (four) times daily.    [provider]  dexamethasone (DECADRON) 4 MG tablet Take 1 tab 3 x day - 2 days, then 2 x day - 2 days, then 1 tab daily 05/04/20   Unk Pinto, MD  fenofibrate 160 MG tablet Take 1 tablet Daily for Triglycerides (Blood Fats) 02/14/20   Unk Pinto, MD  glipiZIDE (GLUCOTROL) 5 MG tablet Take 1 tablet 3 x /day with Meals for Diabetes 05/10/20   Unk Pinto, MD  glucose blood (CONTOUR NEXT TEST) test strip Test blood sugar  three times a day before meals 04/05/19   Unk Pinto, MD  ipratropium (ATROVENT) 0.06 % nasal spray Use 1 to 2 sprays each nostril 2 to 3 x /day as needed 09/28/19   Unk Pinto, MD  metFORMIN (GLUCOPHAGE-XR) 500 MG 24 hr tablet Take 2 tablets 2 x /Day with Food for Diabetes 11/11/19   Unk Pinto, MD  MICROLET LANCETS MISC USE TO TEST UP TO THREE TIMES DAILY AS DIRECTED 10/15/17    Unk Pinto, MD  naproxen (NAPROSYN) 500 MG tablet Take 1 tablet (500 mg total) by mouth 2 (two) times daily as needed for moderate pain. 06/09/20   Lucrezia Starch, MD  olmesartan (BENICAR) 40 MG tablet Take 1/2 to 1 tablet at night for BP 09/28/19   Unk Pinto, MD  OVER THE COUNTER MEDICATION Novolin N 70/30-- injects 35 units in the AM and 30 units in the PM.    [provider]  oxyCODONE-acetaminophen (PERCOCET) 5-325 MG tablet Take 1 tablet by mouth every 4 (four) hours as needed for up to 3 days for severe pain. 06/09/20 06/12/20  Lucrezia Starch, MD  pregabalin (LYRICA) 100 MG capsule Take 1 capsule 2 x /day for Pain 02/17/20   Unk Pinto, MD  Semaglutide, 1 MG/DOSE, (OZEMPIC, 1 MG/DOSE,) 2 MG/1.5ML SOPN Inject 0.75 mLs (1 mg total) into the skin once a week. 04/08/20   Unk Pinto, MD  tamsulosin (FLOMAX) 0.4 MG CAPS capsule Take 1 capsule (0.4 mg total) by mouth daily. 06/09/20 07/09/20  Lucrezia Starch, MD    Allergies    Topamax [topiramate], Zocor [simvastatin], and Zoloft [sertraline hcl]  Review of Systems   Review of Systems  Constitutional: Negative for chills and fever.  HENT: Negative for ear pain and sore throat.   Eyes: Negative for pain and visual disturbance.  Respiratory: Negative for cough and shortness of breath.   Cardiovascular: Negative for chest pain and palpitations.  Gastrointestinal: Positive for abdominal pain. Negative for vomiting.  Genitourinary: Positive for flank pain. Negative for dysuria and hematuria.  Musculoskeletal: Negative for arthralgias and back pain.  Skin: Negative for color change and rash.  Neurological: Negative for seizures and syncope.  All other systems reviewed and are negative.   Physical Exam Updated Vital Signs BP (!) 146/87   Pulse 100   Temp 98 F (36.7 C)   Resp 18   Ht _0  (1.778 m)   Wt 104.3 kg   SpO2 100%   BMI 33.00 kg/m   Physical Exam Vitals and nursing note reviewed.   Constitutional:      Appearance: He is well-developed.  HENT:     Head: Normocephalic and atraumatic.  Eyes:     Conjunctiva/sclera: Conjunctivae normal.  Cardiovascular:     Rate and Rhythm: Normal rate and regular rhythm.     Heart sounds: No murmur heard.   Pulmonary:     Effort: Pulmonary effort is normal. No respiratory distress.     Breath sounds: Normal breath  sounds.  Abdominal:     Palpations: Abdomen is soft.     Tenderness: There is abdominal tenderness in the left lower quadrant.     Comments: Tenderness over the left flank, left lower quadrant, no rebound or guarding noted  Musculoskeletal:     Cervical back: Neck supple.  Skin:    General: Skin is warm and dry.  Neurological:     General: No focal deficit present.     Mental Status: He is alert.  Psychiatric:        Mood and Affect: Mood normal.        Behavior: Behavior normal.     ED Results / Procedures / Treatments   Labs (all labs ordered are listed, but only abnormal results are displayed) Labs Reviewed  URINALYSIS, ROUTINE W REFLEX MICROSCOPIC - Abnormal; Notable for the following components:      Result Value   Glucose, UA 100 (*)    All other components within normal limits  CBC WITH DIFFERENTIAL/PLATELET - Abnormal; Notable for the following components:   Lymphs Abs 4.3 (*)    All other components within normal limits  COMPREHENSIVE METABOLIC PANEL - Abnormal; Notable for the following components:   Glucose, Bld 219 (*)    ALT 48 (*)    All other components within normal limits  LIPASE, BLOOD - Abnormal; Notable for the following components:   Lipase 57 (*)    All other components within normal limits    EKG None  Radiology CT Renal Stone Study  Result Date: 06/08/2020 CLINICAL DATA:  Left-sided flank pain EXAM: CT ABDOMEN AND PELVIS WITHOUT CONTRAST TECHNIQUE: Multidetector CT imaging of the abdomen and pelvis was performed following the standard protocol without IV contrast.  COMPARISON:  None. FINDINGS: Lower chest: No acute abnormality. Hepatobiliary: Fatty infiltration of the liver is noted. The gallbladder is decompressed. Pancreas: Unremarkable. No pancreatic ductal dilatation or surrounding inflammatory changes. Spleen: Normal in size without focal abnormality. Adrenals/Urinary Tract: Adrenal glands are within normal limits. Kidneys are well visualized bilaterally. No renal calculi are noted. Mild hydronephrosis and hydroureter is noted on the left. This extends to the level of the ureterovesical junction and in the distal ureter there is a small 3 mm stone identified causing the obstructive change. The bladder is within normal limits. Stomach/Bowel: Scattered diverticular change of the colon is noted without diverticulitis. No inflammatory changes are seen. The appendix is within normal limits. Small bowel and stomach are unremarkable. Vascular/Lymphatic: Aortic atherosclerosis. No enlarged abdominal or pelvic lymph nodes. Reproductive: Prostate is unremarkable. Other: No abdominal wall hernia or abnormality. No abdominopelvic ascites. Musculoskeletal: Degenerative changes of lumbar spine are noted. IMPRESSION: 3 mm distal left ureteral stone with mild hydronephrosis and hydroureter. Diverticulosis without diverticulitis. Fatty liver. Electronically Signed   By: Inez Catalina M.D.   On: 06/08/2020 23:41    Procedures Procedures (including critical care time)  Medications Ordered in ED Medications  fentaNYL (SUBLIMAZE) injection 50 mcg (50 mcg Intravenous Given 06/08/20 2215)  ondansetron (ZOFRAN) injection 4 mg (4 mg Intravenous Given 06/08/20 2215)  oxyCODONE-acetaminophen (PERCOCET/ROXICET) 5-325 MG per tablet 1 tablet (1 tablet Oral Given 06/09/20 0009)  ketorolac (TORADOL) 15 MG/ML injection 15 mg (15 mg Intravenous Given 06/09/20 0009)    ED Course  I have reviewed the triage vital signs and the nursing notes.  Pertinent labs & imaging results that were available  during my care of the patient were reviewed by me and considered in my medical decision making (see chart  for details).  Clinical Course as of Jun 09 305  Sat Jun 09, 2020  0051 Symptoms resolved after toradol    [RD]    Clinical Course User Index [RD] Lucrezia Starch, MD   MDM Rules/Calculators/A&P                         54 year old male presenting to ER with concern for left flank pain.  CT with small stone at left UVJ.  Culprit for symptoms today.  UA negative for infection, afebrile.  Symptoms well controlled, appropriate for discharge and outpatient management.  Recommended follow-up with urology.  After the discussed management above, the patient was determined to be safe for discharge.  The patient was in agreement with this plan and all questions regarding their care were answered.  ED return precautions were discussed and the patient will return to the ED with any significant worsening of condition.    Final Clinical Impression(s) / ED Diagnoses Final diagnoses:  Kidney stone    Rx / DC Orders ED Discharge Orders         Ordered    oxyCODONE-acetaminophen (PERCOCET) 5-325 MG tablet  Every 4 hours PRN        06/09/20 0055    naproxen (NAPROSYN) 500 MG tablet  2 times daily PRN        06/09/20 0055    tamsulosin (FLOMAX) 0.4 MG CAPS capsule  Daily        06/09/20 0055           Lucrezia Starch, MD 06/09/20 [phone]

## 2020-06-09 NOTE — Discharge Instructions (Addendum)
Follow-up with urology regarding her kidney stone.  Take the prescribed Flomax to help with your stone pass.  Take the Percocet and naproxen as needed for pain control.  Note this can make you drowsy and should not be taken while driving or operating heavy machinery.  If you develop fever, uncontrolled pain, vomiting, return immediately to ER for reassessment.

## 2020-08-13 ENCOUNTER — Encounter: Payer: BC Managed Care – PPO | Admitting: Internal Medicine

## 2020-08-14 ENCOUNTER — Other Ambulatory Visit: Payer: Self-pay | Admitting: Internal Medicine

## 2020-08-14 DIAGNOSIS — E1165 Type 2 diabetes mellitus with hyperglycemia: Secondary | ICD-10-CM

## 2020-08-14 MED ORDER — GLIPIZIDE 5 MG PO TABS: ORAL_TABLET | ORAL | 0 refills | Status: DC

## 2020-08-26 ENCOUNTER — Encounter: Payer: Self-pay | Admitting: Internal Medicine

## 2020-08-26 NOTE — Patient Instructions (Signed)

## 2020-08-26 NOTE — Progress Notes (Signed)
Annual  Screening/Preventative Visit  & Comprehensive Evaluation & Examination      This very nice 55 y.o.  [name] presents for a Screening /Preventative Visit & comprehensive evaluation and management of multiple medical co-morbidities.  Patient has been followed for HTN, HLD, T2_IDDM and Vitamin D Deficiency. Patient has prior hx/o Gout and stable off meds.       Patient is also c/o sinus congestion & post casal drainage an thick yellowish nasal secretions. Also c/o persistent cough which he attributes to the PNDrainage.Marland Kitchen      HTN predates scirca 2004 . Patient's BP has been controlled at home.  Today's BP was initially slightly elevated & rechecked at goal -  138/82. Patient denies any cardiac symptoms as chest pain, palpitations, shortness of breath, dizziness or ankle swelling.      Patient's hyperlipidemia is controlled with diet and medications. Patient denies myalgias or other medication SE's. Last lipids were at goal albeit elevated Trig's:  Lab Results  Component Value Date   CHOL 138 05/09/2020   HDL 22 (L) 05/09/2020   LDLCALC not calculated 05/09/2020   TRIG 601 (H) 05/09/2020   CHOLHDL 6.3 (H) 05/09/2020        Patient has hx/o moderate Obesity (BMI 31+) and insulin requiring T2_IDDM (2004) w/CKD2  (GFR 87). Patient was started on Novolin 70/30 in Feb 2020.  He was started on Ozempic in Nov 2020 (weight was 250#) . Patient denies reactive hypoglycemic symptoms, visual blurring, diabetic polys or paresthesias. Last A1c was not at goal:   Lab Results  Component Value Date   HGBA1C 8.8 (H) 05/09/2020    Wt Readings from Last 4Encounters:  08/27/20 236 lb   06/08/20 230 lb   05/09/20 224 lb   02/01/20 242 lb 12.8 oz        Finally, patient has history of Vitamin D Deficiency ("15" /2009) and last vitamin D was at goal:   Lab Results  Component Value Date   VD25OH 66 02/01/2020    Current Outpatient Medications on File Prior to Visit  Medication Sig  . aspirin  81 MG tablet Take  daily.  Marland Kitchen atenolol (TENORMIN) 100 MG tablet Take 1 tablet Daily for BP  . VITAMIN D 2000 units CAPS Take 4 capsules  daily.   . Cinnamon 500 MG capsule Take 4 (four) times daily.  . fenofibrate 160 MG tablet Take 1 tablet Daily f  . glipiZIDE  5 MG tablet Take 1 tablet 3 x /day with Meals  . ipratropium  0.06 % nasal spray Use 1 to 2 sprays each nostril 2 to 3 x /day as needed  . metFORMIN -XR 500 MG 24 hr tablet Take 2 tablets 2 x /Day with Food for Diabetes  . naproxen (NAPROSYN) 500 MG tablet Take 1 tablet 2 x daily as needed   . olmesartan (BENICAR) 40 MG tablet Take 1/2 to 1 tablet at night for BP  . Novolin N 70/30 ->>  injects 35 units - AM and 30 units - PM.  . pregabalin (LYRICA) 100 MG capsule Take 1 capsule 2 x /day for Pain  . Semaglutide, 1 MG/DOSE, (OZEMPIC, 1 MG/DOSE,) 2 MG/1.5ML SOPN Inject 0.75 mLs (1 mg total) once a week.    Allergies  Allergen Reactions  . Topamax [Topiramate] Other (See Comments)    Vision changes/loss of vision  . Zocor [Simvastatin]     Headache  . Zoloft [Sertraline Hcl]     dysphona    Past  Medical History:  Diagnosis Date  . Asthma   . Diabetes mellitus without complication (HCC)   . Fatty liver disease, nonalcoholic   . GERD (gastroesophageal reflux disease)   . Gout   . Hyperlipidemia   . Hypertension   . Hypogonadism male   . Obesity   . Venous insufficiency    Health Maintenance  Topic Date Due  . COVID-19 Vaccine (1) Never done  . OPHTHALMOLOGY EXAM  Never done  . COLONOSCOPY (Pts 45-32yrs Insurance coverage will need to be confirmed)  06/26/2010  . INFLUENZA VACCINE  Never done  . FOOT EXAM  07/26/2020  . HEMOGLOBIN A1C  11/07/2020  . TETANUS/TDAP  02/10/2023  . PNEUMOCOCCAL POLYSACCHARIDE VACCINE AGE 17-64 HIGH RISK  Completed  . Hepatitis C Screening  Completed  . HIV Screening  Completed   Immunization History  Administered Date(s) Administered  . PPD Test 02/17/2014, 03/08/2015, 03/27/2016,  05/13/2017, 06/21/2018, 07/27/2019  . Pneumococcal Polysaccharide-23 07/27/2019  . Pneumococcal-Unspecified 02/09/2013  . Tdap 02/09/2013   Last Colon - 08/08/1995 - Dr Claretha Cooper - Negative Cologard - 07/29/2020 - Never Returned   Past Surgical History:  Procedure Laterality Date  . ANTERIOR CRUCIATE LIGAMENT REPAIR Left 2001  . CYSTOSCOPY  1993  . ESOPHAGOGASTRODUODENOSCOPY  1995, 2002   gastritis, esophagititis  . KNEE ARTHROSCOPY Right 2002, 1995   Family History  Problem Relation Age of Onset  . Diabetes Mother   . Hypertension Mother   . Gout Mother   . COPD Father   . Cancer Sister 3       appendix  . Gout Brother   . Hyperlipidemia Brother   . Hypertension Brother   . Hypertension Brother   . Hyperlipidemia Brother   . Gout Brother   . Diabetes Brother   . COPD Sister    Social History   Socioeconomic History  . Marital status: Divorced  . Number of children: Not on file  Occupational History  . Not on file  Tobacco Use  . Smoking status: Former Smoker    Packs/day: 1.00    Years: 15.00    Pack years: 15.00    Types: Cigarettes    Quit date: 08/07/2000    Years since quitting: 20.0  . Smokeless tobacco: Never Used  Substance and Sexual Activity  . Alcohol use: Yes    Comment: social  . Drug use: No  . Sexual activity: Not on file   ROS Constitutional: Denies fever, chills, weight loss/gain, headaches, insomnia,  night sweats or change in appetite. Does c/o fatigue. Eyes: Denies redness, blurred vision, diplopia, discharge, itchy or watery eyes.  ENT: Denies epistaxis, sore throat, earache, hearing loss, dental pain, Tinnitus, Vertigo, Sinus pain or snoring. C/o PN drainage as above. Cardio: Denies chest pain, palpitations, irregular heartbeat, syncope, dyspnea, diaphoresis, orthopnea, PND, claudication or edema Respiratory: denies cough, dyspnea, DOE, pleurisy, hoarseness, laryngitis or wheezing.  Gastrointestinal: Denies dysphagia, heartburn, reflux,  water brash, pain, cramps, nausea, vomiting, bloating, diarrhea, constipation, hematemesis, melena, hematochezia, jaundice or hemorrhoids Genitourinary: Denies dysuria, frequency, urgency, nocturia, hesitancy, discharge, hematuria or flank pain Musculoskeletal: Denies arthralgia, myalgia, stiffness, Jt. Swelling, pain, limp or strain/sprain. Denies Falls. Skin: Denies puritis, rash, hives, warts, acne, eczema or change in skin lesion Neuro: No weakness, tremor, incoordination, spasms, paresthesia or pain Psychiatric: Denies confusion, memory loss or sensory loss. Denies Depression. Endocrine: Denies change in weight, skin, hair change, nocturia, and paresthesia, diabetic polys, visual blurring or hyper / hypo glycemic episodes.  Heme/Lymph: No excessive bleeding,  bruising or enlarged lymph nodes.  Physical Exam  BP 138/82   Pulse 80   Temp (!) 97 F (36.1 C)   Resp 16   Ht 5\' 10"  (1.778 m)   Wt 236 lb (107 kg)   SpO2 99%   BMI 33.86 kg/m   General Appearance: Over nourished and well groomed and in no apparent distress.  Eyes: PERRLA, EOMs, conjunctiva no swelling or erythema, normal fundi and vessels. Sinuses: (+)  frontal/maxillary tenderness ENT/Mouth: EACs patent / TMs  nl. Nares clear without erythema, swelling, mucoid exudates. Oral hygiene is good. No erythema, swelling, or exudate. Tongue normal, non-obstructing. Tonsils not swollen or erythematous. Hearing normal.  Neck: Supple, thyroid not palpable. No bruits, nodes or JVD. Respiratory: Respiratory effort normal.  BS equal and clear bilateral without rales, rhonci, wheezing or stridor. Cardio: Heart sounds are normal with regular rate and rhythm and no murmurs, rubs or gallops. Peripheral pulses are normal and equal bilaterally without edema. No aortic or femoral bruits. Chest: symmetric with normal excursions and percussion.  Abdomen: Soft, with Nl bowel sounds. Nontender, no guarding, rebound, hernias, masses, or  organomegaly.  Lymphatics: Non tender without lymphadenopathy.  Musculoskeletal: Full ROM all peripheral extremities, joint stability, 5/5 strength, and normal gait. Skin: Warm and dry without rashes, lesions, cyanosis, clubbing or  ecchymosis.  Neuro: Cranial nerves intact, reflexes equal bilaterally. Normal muscle tone, no cerebellar symptoms. Sensation intact.  Pysch: Alert and oriented X 3 with normal affect, insight and judgment appropriate.   Assessment and Plan  1. Annual Preventative/Screening Exam    2. Essential hypertension  - EKG 12-Lead - US, RETROPERITNL ABD,  LTD - Urinalysis, Routine w reflex microscopic - Microalbumin / creatinine urine ratio - CBC with Differential/Platelet - COMPLETE METABOLIC PANEL WITH GFR - Magnesium - TSH  3. Hyperlipidemia associated with type 2 diabetes mellitus (HCC)  - EKG 12-Lead - US, RETROPERITNL ABD,  LTD - Lipid panel - TSH  4. Type 2 diabetes mellitus with stage 2 chronic kidney  disease, with long-term current use of insulin (HCC)  _ Long Discussion Re: Dieting & better control of calories  - EKG 12-Lead - US, RETROPERITNL ABD,  LTD - Urinalysis, Routine w reflex microscopic - Microalbumin / creatinine urine ratio - HM DIABETES FOOT EXAM - TB Skin Test - Hemoglobin A1c  5. Vitamin D deficiency  - VITAMIN D 25 Hydroxy   6. Poorly controlled type 2 diabetes mellitus (HCC)  - Hemoglobin A1c  7. Obesity (BMI 30.0-34.9)   8. Idiopathic gout  - Uric acid  9. Testosterone deficiency  - Testosterone  10. Screening for colorectal cancer  - POC Hemoccult Bld/Stl  - Cologuard  11. Screening for tuberculosis  - TB Skin Test  12. Prostate cancer screening  - PSA  13. Screening for ischemic heart disease  - EKG 12-Lead  14. FH: hypertension  - EKG 12-Lead - US, RETROPERITNL ABD,  LTD  15. Former smoker  - EKG 12-Lead - US, RETROPERITNL ABD,  LTD  16. Screening for AAA (aortic abdominal  aneurysm)  - US, RETROPERITNL ABD,  LTD  17. Fatigue  - Iron,Total/Total Iron Binding Cap - Vitamin B12 - Testosterone - CBC with Differential/Platelet - TSH  18. Medication management  - Urinalysis, Routine w reflex microscopic - Microalbumin / creatinine urine ratio - CBC with Differential/Platelet - COMPLETE METABOLIC PANEL WITH GFR - Magnesium - Lipid panel - TSH - Hemoglobin A1c - VITAMIN D 25 Hydroxy  Patient was counseled in prudent diet, weight control to achieve/maintain BMI less than 25, BP monitoring, regular exercise and medications as discussed. Rx's for Z-pak, Decadron taper & tessalon perles. sent in for Sinusitis /Bronchitis -  Discussed med effects and SE's. Routine screening labs and tests as requested with regular follow-up as recommended. Over 40 minutes of exam, counseling, chart review and high complex critical decision making was performed   Marinus Maw, MD

## 2020-08-27 ENCOUNTER — Ambulatory Visit: Payer: BC Managed Care – PPO | Admitting: Internal Medicine

## 2020-08-27 ENCOUNTER — Other Ambulatory Visit: Payer: Self-pay

## 2020-08-27 ENCOUNTER — Encounter: Payer: Self-pay | Admitting: Internal Medicine

## 2020-08-27 VITALS — BP 138/82 | HR 80 | Temp 97.0°F | Resp 16 | Ht 70.0 in | Wt 236.0 lb

## 2020-08-27 DIAGNOSIS — Z125 Encounter for screening for malignant neoplasm of prostate: Secondary | ICD-10-CM

## 2020-08-27 DIAGNOSIS — I10 Essential (primary) hypertension: Secondary | ICD-10-CM

## 2020-08-27 DIAGNOSIS — Z136 Encounter for screening for cardiovascular disorders: Secondary | ICD-10-CM | POA: Diagnosis not present

## 2020-08-27 DIAGNOSIS — R350 Frequency of micturition: Secondary | ICD-10-CM | POA: Diagnosis not present

## 2020-08-27 DIAGNOSIS — Z0000 Encounter for general adult medical examination without abnormal findings: Secondary | ICD-10-CM

## 2020-08-27 DIAGNOSIS — Z87891 Personal history of nicotine dependence: Secondary | ICD-10-CM

## 2020-08-27 DIAGNOSIS — Z8249 Family history of ischemic heart disease and other diseases of the circulatory system: Secondary | ICD-10-CM

## 2020-08-27 DIAGNOSIS — Z13220 Encounter for screening for lipoid disorders: Secondary | ICD-10-CM

## 2020-08-27 DIAGNOSIS — Z111 Encounter for screening for respiratory tuberculosis: Secondary | ICD-10-CM | POA: Diagnosis not present

## 2020-08-27 DIAGNOSIS — N401 Enlarged prostate with lower urinary tract symptoms: Secondary | ICD-10-CM | POA: Diagnosis not present

## 2020-08-27 DIAGNOSIS — Z1329 Encounter for screening for other suspected endocrine disorder: Secondary | ICD-10-CM | POA: Diagnosis not present

## 2020-08-27 DIAGNOSIS — E349 Endocrine disorder, unspecified: Secondary | ICD-10-CM

## 2020-08-27 DIAGNOSIS — E785 Hyperlipidemia, unspecified: Secondary | ICD-10-CM

## 2020-08-27 DIAGNOSIS — E1165 Type 2 diabetes mellitus with hyperglycemia: Secondary | ICD-10-CM

## 2020-08-27 DIAGNOSIS — Z0001 Encounter for general adult medical examination with abnormal findings: Secondary | ICD-10-CM

## 2020-08-27 DIAGNOSIS — Z1389 Encounter for screening for other disorder: Secondary | ICD-10-CM | POA: Diagnosis not present

## 2020-08-27 DIAGNOSIS — E1169 Type 2 diabetes mellitus with other specified complication: Secondary | ICD-10-CM

## 2020-08-27 DIAGNOSIS — Z131 Encounter for screening for diabetes mellitus: Secondary | ICD-10-CM | POA: Diagnosis not present

## 2020-08-27 DIAGNOSIS — R5383 Other fatigue: Secondary | ICD-10-CM

## 2020-08-27 DIAGNOSIS — E559 Vitamin D deficiency, unspecified: Secondary | ICD-10-CM | POA: Diagnosis not present

## 2020-08-27 DIAGNOSIS — Z79899 Other long term (current) drug therapy: Secondary | ICD-10-CM | POA: Diagnosis not present

## 2020-08-27 DIAGNOSIS — M1000 Idiopathic gout, unspecified site: Secondary | ICD-10-CM

## 2020-08-27 DIAGNOSIS — Z130 Encounter for screening for diseases of the blood and blood-forming organs and certain disorders involving the immune mechanism: Secondary | ICD-10-CM

## 2020-08-27 DIAGNOSIS — Z1211 Encounter for screening for malignant neoplasm of colon: Secondary | ICD-10-CM

## 2020-08-27 DIAGNOSIS — E669 Obesity, unspecified: Secondary | ICD-10-CM

## 2020-08-27 DIAGNOSIS — E1122 Type 2 diabetes mellitus with diabetic chronic kidney disease: Secondary | ICD-10-CM

## 2020-08-27 MED ORDER — BENZONATATE 200 MG PO CAPS: ORAL_CAPSULE | ORAL | 0 refills | Status: DC

## 2020-08-27 MED ORDER — DEXAMETHASONE 2 MG PO TABS: ORAL_TABLET | ORAL | 0 refills | Status: DC

## 2020-08-27 MED ORDER — AZITHROMYCIN 250 MG PO TABS: ORAL_TABLET | ORAL | 1 refills | Status: DC

## 2020-08-28 LAB — CBC WITH DIFFERENTIAL/PLATELET
Absolute Monocytes: 352 cells/uL (ref 200–950)
Basophils Absolute: 38 cells/uL (ref 0–200)
Basophils Relative: 0.6 %
Eosinophils Absolute: 301 cells/uL (ref 15–500)
Eosinophils Relative: 4.7 %
HCT: 44.6 % (ref 38.5–50.0)
Hemoglobin: 15 g/dL (ref 13.2–17.1)
Lymphs Abs: 2176 cells/uL (ref 850–3900)
MCH: 28.4 pg (ref 27.0–33.0)
MCHC: 33.6 g/dL (ref 32.0–36.0)
MCV: 84.3 fL (ref 80.0–100.0)
MPV: 10 fL (ref 7.5–12.5)
Monocytes Relative: 5.5 %
Neutro Abs: 3533 cells/uL (ref 1500–7800)
Neutrophils Relative %: 55.2 %
Platelets: 260 10*3/uL (ref 140–400)
RBC: 5.29 10*6/uL (ref 4.20–5.80)
RDW: 12.7 % (ref 11.0–15.0)
Total Lymphocyte: 34 %
WBC: 6.4 10*3/uL (ref 3.8–10.8)

## 2020-08-28 LAB — COMPLETE METABOLIC PANEL WITH GFR
AG Ratio: 1.7 (calc) (ref 1.0–2.5)
ALT: 28 U/L (ref 9–46)
AST: 26 U/L (ref 10–35)
Albumin: 4.3 g/dL (ref 3.6–5.1)
Alkaline phosphatase (APISO): 40 U/L (ref 35–144)
BUN/Creatinine Ratio: 31 (calc) — ABNORMAL HIGH (ref 6–22)
BUN: 20 mg/dL (ref 7–25)
CO2: 30 mmol/L (ref 20–32)
Calcium: 9.8 mg/dL (ref 8.6–10.3)
Chloride: 104 mmol/L (ref 98–110)
Creat: 0.65 mg/dL — ABNORMAL LOW (ref 0.70–1.33)
GFR, Est African American: 127 mL/min/{1.73_m2} (ref >=60)
GFR, Est Non African American: 110 mL/min/{1.73_m2} (ref >=60)
Globulin: 2.5 g/dL (calc) (ref 1.9–3.7)
Glucose, Bld: 172 mg/dL — ABNORMAL HIGH (ref 65–99)
Potassium: 4.9 mmol/L (ref 3.5–5.3)
Sodium: 143 mmol/L (ref 135–146)
Total Bilirubin: 0.3 mg/dL (ref 0.2–1.2)
Total Protein: 6.8 g/dL (ref 6.1–8.1)

## 2020-08-28 LAB — URINALYSIS, ROUTINE W REFLEX MICROSCOPIC
Bilirubin Urine: NEGATIVE
Hgb urine dipstick: NEGATIVE
Ketones, ur: NEGATIVE
Leukocytes,Ua: NEGATIVE
Nitrite: NEGATIVE
Protein, ur: NEGATIVE
Specific Gravity, Urine: 1.024 (ref 1.001–1.03)
pH: <=5 (ref 5.0–8.0)

## 2020-08-28 LAB — MICROALBUMIN / CREATININE URINE RATIO
Creatinine, Urine: 130 mg/dL (ref 20–320)
Microalb Creat Ratio: 5 mcg/mg creat (ref <30)
Microalb, Ur: 0.6 mg/dL

## 2020-08-28 LAB — MAGNESIUM: Magnesium: 1.7 mg/dL (ref 1.5–2.5)

## 2020-08-28 LAB — LIPID PANEL
Cholesterol: 146 mg/dL (ref <200)
HDL: 24 mg/dL — ABNORMAL LOW (ref >=40)
LDL Cholesterol (Calc): 90 mg/dL (calc)
Non-HDL Cholesterol (Calc): 122 mg/dL (calc) (ref <130)
Total CHOL/HDL Ratio: 6.1 (calc) — ABNORMAL HIGH (ref <5.0)
Triglycerides: 233 mg/dL — ABNORMAL HIGH (ref <150)

## 2020-08-28 LAB — IRON, TOTAL/TOTAL IRON BINDING CAP
%SAT: 22 % (calc) (ref 20–48)
Iron: 87 ug/dL (ref 50–180)
TIBC: 402 mcg/dL (calc) (ref 250–425)

## 2020-08-28 LAB — TSH: TSH: 1.87 mIU/L (ref 0.40–4.50)

## 2020-08-28 LAB — HEMOGLOBIN A1C
Hgb A1c MFr Bld: 8.4 % of total Hgb — ABNORMAL HIGH (ref <5.7)
Mean Plasma Glucose: 194 mg/dL
eAG (mmol/L): 10.8 mmol/L

## 2020-08-28 LAB — TESTOSTERONE: Testosterone: 229 ng/dL — ABNORMAL LOW (ref 250–827)

## 2020-08-28 LAB — VITAMIN D 25 HYDROXY (VIT D DEFICIENCY, FRACTURES): Vit D, 25-Hydroxy: 67 ng/mL (ref 30–100)

## 2020-08-28 LAB — PSA: PSA: 0.16 ng/mL (ref <=4.0)

## 2020-08-28 LAB — VITAMIN B12: Vitamin B-12: 358 pg/mL (ref 200–1100)

## 2020-08-28 LAB — URIC ACID: Uric Acid, Serum: 5.3 mg/dL (ref 4.0–8.0)

## 2020-08-28 NOTE — Progress Notes (Signed)
========================================================== - Test results slightly outside the reference range are not unusual. If there is anything important, I will review this with you,  otherwise it is considered normal test values.  If you have further questions,  please do not hesitate to contact me at the office or via My Chart.  ========================================================== ==========================================================  - Vitamin B12 =  358    -  Very Low  (Ideal or Goal Vit B12 is between 450 - 1,100)   Low Vit B12 may be associated with Anemia , Fatigue,   Peripheral Neuropathy, Dementia, "Brain Fog", & Depression  - Recommend take a sub-lingual form of Vitamin B12 tablet   1,000 to 5,000 mcg tab that you dissolve under your tongue /Daily   - Can get Lavonia Dana - best price at [employer] or on [employer] =========================================================== ===========================================================  -  PSA - is very Low - Great  =========================================================== ===========================================================  - Testosterone is better, but still in the slightly below Normal Range  -  Recommend take Zinc 50 mg tablet Daily to help                                                                   raise Testosterone levels naturally ===========================================================  - Glucose was 172mg %     and A1c = 8.4% - still elevated & about the same,   So. . . .                                           It is very important that you work harder with diet by  avoiding all foods that are white except chicken,   fish & calliflower.  - Avoid white rice  (brown & wild rice is OK),   - Avoid white potatoes  (sweet potatoes in moderation is OK),   White bread or wheat bread or anything made out of   white flour like bagels, donuts, rolls, buns, biscuits, cakes,  -  pastries, cookies, pizza crust, and pasta (made from  white flour & egg whites)   - vegetarian pasta or spinach or wheat pasta is OK.  - Multigrain breads like Arnold's, Pepperidge Farm or   multigrain sandwich thins or high fiber breads like   [place] bread or "Dave's Killer" breads that are  4 to 5 grams fiber per slice !  are best.    Diet, exercise and weight loss can reverse and cure  diabetes in the early stages.    - Diet, exercise and weight loss is very important in the   control and prevention of complications of diabetes which  affects every system in your body, ie.   -Brain - dementia/stroke,  - eyes - glaucoma/blindness,  - heart - heart attack/heart failure,  - kidneys - dialysis,  - stomach - gastric paralysis,  - intestines - malabsorption,  - nerves - severe painful neuritis,  - circulation - gangrene & loss of a leg(s)  - and finally  . . . . . . . . . . . . . . . . . .    - cancer and Alzheimers. ========================================================== ==========================================================  - Vitamin D -  67 - Excellent  ========================================================== ==========================================================  - All Else - CBC - Kidneys - Electrolytes - Liver - Magnesium & Thyroid    - all  Normal / OK ===========================================================

## 2020-08-30 LAB — TB SKIN TEST
Induration: 0 mm
TB Skin Test: NEGATIVE

## 2020-09-11 ENCOUNTER — Other Ambulatory Visit: Payer: Self-pay | Admitting: Internal Medicine

## 2020-09-11 DIAGNOSIS — Z1211 Encounter for screening for malignant neoplasm of colon: Secondary | ICD-10-CM | POA: Diagnosis not present

## 2020-09-11 DIAGNOSIS — E1142 Type 2 diabetes mellitus with diabetic polyneuropathy: Secondary | ICD-10-CM

## 2020-09-11 DIAGNOSIS — Z1212 Encounter for screening for malignant neoplasm of rectum: Secondary | ICD-10-CM | POA: Diagnosis not present

## 2020-09-19 LAB — COLOGUARD
COLOGUARD: NEGATIVE
Cologuard: NEGATIVE

## 2020-09-20 ENCOUNTER — Encounter: Payer: Self-pay | Admitting: *Deleted

## 2020-09-21 ENCOUNTER — Other Ambulatory Visit: Payer: Self-pay | Admitting: Internal Medicine

## 2020-09-21 DIAGNOSIS — E1165 Type 2 diabetes mellitus with hyperglycemia: Secondary | ICD-10-CM

## 2020-09-21 MED ORDER — OZEMPIC (1 MG/DOSE) 2 MG/1.5ML ~~LOC~~ SOPN: 1.0000 mg | PEN_INJECTOR | SUBCUTANEOUS | 1 refills | Status: DC

## 2020-09-27 ENCOUNTER — Encounter: Payer: Self-pay | Admitting: *Deleted

## 2020-10-11 ENCOUNTER — Encounter: Payer: Self-pay | Admitting: *Deleted

## 2020-12-13 ENCOUNTER — Ambulatory Visit: Payer: BC Managed Care – PPO | Admitting: Adult Health

## 2020-12-13 ENCOUNTER — Encounter: Payer: Self-pay | Admitting: Adult Health

## 2020-12-13 ENCOUNTER — Other Ambulatory Visit: Payer: Self-pay

## 2020-12-13 VITALS — BP 120/80 | HR 89 | Temp 96.4°F | Wt 238.0 lb

## 2020-12-13 DIAGNOSIS — E559 Vitamin D deficiency, unspecified: Secondary | ICD-10-CM

## 2020-12-13 DIAGNOSIS — E1169 Type 2 diabetes mellitus with other specified complication: Secondary | ICD-10-CM | POA: Diagnosis not present

## 2020-12-13 DIAGNOSIS — Z79899 Other long term (current) drug therapy: Secondary | ICD-10-CM

## 2020-12-13 DIAGNOSIS — I10 Essential (primary) hypertension: Secondary | ICD-10-CM | POA: Diagnosis not present

## 2020-12-13 DIAGNOSIS — G4700 Insomnia, unspecified: Secondary | ICD-10-CM | POA: Insufficient documentation

## 2020-12-13 DIAGNOSIS — E538 Deficiency of other specified B group vitamins: Secondary | ICD-10-CM | POA: Diagnosis not present

## 2020-12-13 DIAGNOSIS — E669 Obesity, unspecified: Secondary | ICD-10-CM | POA: Diagnosis not present

## 2020-12-13 DIAGNOSIS — E119 Type 2 diabetes mellitus without complications: Secondary | ICD-10-CM

## 2020-12-13 DIAGNOSIS — E785 Hyperlipidemia, unspecified: Secondary | ICD-10-CM | POA: Diagnosis not present

## 2020-12-13 DIAGNOSIS — E1142 Type 2 diabetes mellitus with diabetic polyneuropathy: Secondary | ICD-10-CM | POA: Diagnosis not present

## 2020-12-13 MED ORDER — TRAZODONE HCL 50 MG PO TABS: ORAL_TABLET | ORAL | 0 refills | Status: DC

## 2020-12-13 NOTE — Progress Notes (Signed)
FOLLOW UP  Assessment and Plan:    Essential hypertension - continue medications, DASH diet, exercise and monitor at home. Call if greater than 130/80.  -     CBC with Differential/Platelet -     COMPLETE METABOLIC PANEL WITH GFR -     Magnesium   T2_NIDDM Education: Reviewed 'ABCs' of diabetes management (respective goals in parentheses):  A1C (<7), blood pressure (<130/80), and cholesterol (LDL <70) Eye Exam yearly and Dental Exam every 6 months. Dietary recommendations - high fiber low starch/sugar Physical Activity recommendations - Continue checking glucose BID - continue current meds, no concerns with hypoglycemia, expect above goal due to steroid  - advised keep food log, track trends with glucose - may benefit from CGM, consider if persists above goal  - Continue insulin as needed but be mindful of low sugars -     Hemoglobin A1c  Hyperlipidemia associated with T2DM (HCC) check lipids, on fenofibrate, not currently on statin as has been at LDL goal by lifestyle, hx of statin SE and prefers to avoid if possible Consider zeita if needed decrease fatty foods increase activity.  -     Lipid panel -     TSH  Diabetic polyneuropathy associated with type 2 diabetes mellitus (HCC) - improve sugars, daily foot checks emphasized, try topical capsaicin/arthritis creams at night for distraction, foot care information given -     pregabalin (LYRICA) 100 MG capsule; Take 1 capsule (100 mg total) by mouth 2 (two) times daily.  B12 def Newly on supplement; recheck levels  Vitamin D def - Continue supplement, at goal recent check  Insomnia - good sleep hygiene discussed, increase day time activity, try capsaicin distraction for diabetic neuropathy, continue lyrica, try trazodone for sleep   Continue diet and meds as discussed. Further disposition pending results of labs. Discussed med's effects and SE's.   Over 30 minutes of exam, counseling, chart review, and critical decision  making was performed.   Future Appointments  Date Time Provider [place]  03/19/2021  4:00 PM Unk Pinto, MD [facility] None  09/05/2021  2:00 PM Unk Pinto, MD [facility] None    ----------------------------------------------------------------------------------------------------------------------  HPI 55 y.o. male  presents for 3 month follow up on hypertension, cholesterol, T2 diabetes, obesity and vitamin D deficiency.   Lost his daughter March 2021, his ex wife is Engineer, manufacturing systems.  BMI is Body mass index is 34.15 kg/m., he has not been working on diet and exercise. Admits has been busy at work, hasn't had time to exercise, short staffed.   Admits also not eating well, does try to eat more veggies, starch, will do double serving of veggie. Has been doing whole grain bread, Wt Readings from Last 3 Encounters:  12/13/20 238 lb (108 kg)  08/27/20 236 lb (107 kg)  06/08/20 230 lb (104.3 kg)   His blood pressure has been controlled at home, today their BP is BP: 120/80  Denies CP, dyspnea, dizziness.    He is not on cholesterol medication other than fenofibrate and denies myalgias. HA with simvastatin. His cholesterol is at goal of less than 70. The cholesterol last visit was:   Lab Results  Component Value Date   CHOL 146 08/27/2020   HDL 24 (L) 08/27/2020   LDLCALC 90 08/27/2020   TRIG 233 (H) 08/27/2020   CHOLHDL 6.1 (H) 08/27/2020    He has been working on diet and exercise for T2 diabetes [facility]  He is on  ARB, no CKD Hyperlipidemia, not on med, at goal he is on fenofibrate He does have neuropathy, on lyrica 100 mg BID, has tried gabapentin He is on insulin 70/30 30-35 in AM, 10-30 PM, average 22, depends on glucose. Ozempic 69m q weekly Metformin 5031m4 a day He is on glipizide 5 mg 3 x a day, avoids at bed time.  Denies low sugars since backing off on insulin, titrates PRN Has been higher due to recent steroid  taper denies hypoglycemia , increased appetite, nausea, paresthesia of the feet, polydipsia, polyuria, visual disturbances, vomiting and weight loss.  Fasting: 70-250, very labile Prior to dinner: average 150, 100-200 Uses contour testing strip He was Last A1C in the office was:  Lab Results  Component Value Date   HGBA1C 8.4 (H) 08/27/2020   Lab Results  Component Value Date   GFRNONAA 110 08/27/2020   Patient is on Vitamin D supplement and at goal at recent check:    Lab Results  Component Value Date   VD25OH 67 08/27/2020     He reports is newly taking 2500 mcg SL B12 per Dr. McMelford Aaseecommendation Lab Results  Component Value Date   [id number] 3291/24/2022     Current Medications:   Current Outpatient Medications (Endocrine & Metabolic):  .  glipiZIDE (GLUCOTROL) 5 MG tablet, Take 1 tablet 3 x /day with Meals for Diabetes .  metFORMIN (GLUCOPHAGE-XR) 500 MG 24 hr tablet, Take 2 tablets 2 x /Day with Food for Diabetes .  Semaglutide, 1 MG/DOSE, (OZEMPIC, 1 MG/DOSE,) 2 MG/1.5ML SOPN, Inject 1 mg into the skin once a week.  Current Outpatient Medications (Cardiovascular):  .  atenolol (TENORMIN) 100 MG tablet, Take 1 tablet Daily for BP .  fenofibrate 160 MG tablet, Take 1 tablet Daily for Triglycerides (Blood Fats) .  olmesartan (BENICAR) 40 MG tablet, Take 1/2 to 1 tablet at night for BP  Current Outpatient Medications (Respiratory):  .  ipratropium (ATROVENT) 0.06 % nasal spray, Use 1 to 2 sprays each nostril 2 to 3 x /day as needed  Current Outpatient Medications (Analgesics):  .  aspirin 81 MG tablet, Take 81 mg by mouth daily.   Current Outpatient Medications (Other):  .  Blood Glucose Monitoring Suppl (CONTOUR NEXT MONITOR) w/Device KIT, 1 Device by Does not apply route 3 (three) times daily. .  Cholecalciferol (VITAMIN D) 2000 units CAPS, Take 4 capsules by mouth daily.  .  Cinnamon 500 MG capsule, Take 500 mg by mouth 4 (four) times daily. . Marland Kitchenglucose blood  (CONTOUR NEXT TEST) test strip, Test blood sugar  three times a day before meals .  MICROLET LANCETS MISC, USE TO TEST UP TO THREE TIMES DAILY AS DIRECTED .  OVER THE COUNTER MEDICATION, Novolin N 70/30-- injects 35 units in the AM and 30 units in the PM. .  pregabalin (LYRICA) 100 MG capsule, Take  1 capsule  2 x /day  for Diabetic Neuropathy Pain   Allergies:  Allergies  Allergen Reactions  . Topamax [Topiramate] Other (See Comments)    Vision changes/loss of vision  . Zocor [Simvastatin]     Headache  . Zoloft [Sertraline Hcl]     dysphona     Medical History:  Past Medical History:  Diagnosis Date  . Asthma   . Diabetes mellitus without complication ([place]  . Fatty liver disease, nonalcoholic   . GERD (gastroesophageal reflux disease)   . Gout   . Hyperlipidemia   . Hypertension   .  Hypogonadism male   . Obesity   . Venous insufficiency    Family history- Reviewed and unchanged Social history- Reviewed and unchanged   Review of Systems:  Review of Systems  Constitutional: Negative for malaise/fatigue and weight loss.  HENT: Negative for congestion, hearing loss, sinus pain and tinnitus.   Eyes: Negative for blurred vision and double vision.  Respiratory: Negative for cough, shortness of breath and wheezing.   Cardiovascular: Negative for chest pain, palpitations, orthopnea, claudication and leg swelling.  Gastrointestinal: Negative for abdominal pain, blood in stool, constipation, diarrhea, heartburn, melena, nausea and vomiting.  Genitourinary: Negative.   Musculoskeletal: Positive for joint pain. Negative for myalgias.  Skin: Negative for rash.  Neurological: Positive for tingling (feet and hands intermittent). Negative for dizziness, sensory change, weakness and headaches.  Endo/Heme/Allergies: Negative for polydipsia.  Psychiatric/Behavioral: Negative for depression, substance abuse and suicidal ideas. The patient has insomnia (difficulty staying asleep). The  patient is not nervous/anxious.   All other systems reviewed and are negative.   Physical Exam: BP 120/80   Pulse 89   Temp (!) 96.4 F (35.8 C)   Wt 238 lb (108 kg)   SpO2 98%   BMI 34.15 kg/m  Wt Readings from Last 3 Encounters:  12/13/20 238 lb (108 kg)  08/27/20 236 lb (107 kg)  06/08/20 230 lb (104.3 kg)   General Appearance: Well nourished, in no apparent distress. Eyes: PERRLA, EOMs, conjunctiva no swelling or erythema Sinuses: No Frontal/maxillary tenderness ENT/Mouth: Ext aud canals clear, TMs without erythema, bulging. No erythema, swelling, or exudate on post pharynx.  Tonsils not swollen or erythematous. Hearing normal.  Neck: Supple, thyroid normal.  Respiratory: Respiratory effort normal, BS equal bilaterally without rales, rhonchi, wheezing or stridor.  Cardio: RRR with no MRGs. Brisk peripheral pulses with mild edema left leg more than right, no warmth, tenderness, erythema. Neg homen's.  Abdomen: Soft, + BS.  Non tender, no guarding, rebound, hernias, masses. Lymphatics: Non tender without lymphadenopathy.  Musculoskeletal: Full ROM, 5/5 strength, Normal gait Skin: Warm, dry without rashes, lesions, ecchymosis.  Neuro: Cranial nerves intact. No cerebellar symptoms.  Psych: Awake and oriented X 3, normal affect, Insight and Judgment appropriate.    Izora Ribas, NP 4:25 PM [facility]

## 2020-12-13 NOTE — Patient Instructions (Addendum)
Goals    . HEMOGLOBIN A1C < 7    . LDL CALC < 70        Try topical arthritis cream on hands/feet at night - can help with neuropathy  Be sure checking feet daily for wounds/callouses, cracks Very high risk for diabetic foot problems     Diabetic Neuropathy Diabetic neuropathy refers to nerve damage that is caused by diabetes. Over time, people with diabetes can develop nerve damage throughout the body. There are several types of diabetic neuropathy:  Peripheral neuropathy. This is the most common type of diabetic neuropathy. It damages the nerves that carry signals between the spinal cord and other parts of the body (peripheral nerves). This usually affects nerves in the feet, legs, hands, and arms.  Autonomic neuropathy. This type causes damage to nerves that control involuntary functions (autonomic nerves). Involuntary functions are functions of the body that you do not control. They include heartbeat, body temperature, blood pressure, urination, digestion, sweating, sexual function, or response to changes in blood glucose.  Focal neuropathy. This type of nerve damage affects one area of the body, such as an arm, a leg, or the face. The injury may involve one nerve or a small group of nerves. Focal neuropathy can be painful and unpredictable. It occurs most often in older adults with diabetes. This often develops suddenly, but usually improves over time and does not cause long-term problems.  Proximal neuropathy. This type of nerve damage affects the nerves of the thighs, hips, buttocks, or legs. It causes severe pain, weakness, and muscle death (atrophy), usually in the thigh muscles. It is more common among older men and people who have type 2 diabetes. The length of recovery time may vary. What are the causes? Peripheral, autonomic, and focal neuropathies are caused by diabetes that is not well controlled with treatment. The cause of proximal neuropathy is not known, but it may be  caused by inflammation related to uncontrolled blood glucose levels. What are the signs or symptoms? Peripheral neuropathy Peripheral neuropathy develops slowly over time. When the nerves of the feet and legs no longer work, you may experience:  Burning, stabbing, or aching pain in the legs or feet.  Pain or cramping in the legs or feet.  Loss of feeling (numbness) and inability to feel pressure or pain in the feet. This can lead to: ? Thick calluses or sores on areas of constant pressure. ? Ulcers. ? Reduced ability to feel temperature changes.  Foot deformities.  Muscle weakness.  Loss of balance or coordination. Autonomic neuropathy The symptoms of autonomic neuropathy vary depending on which nerves are affected. Symptoms may include:  Problems with digestion, such as: ? Nausea or vomiting. ? Poor appetite. ? Bloating. ? Diarrhea or constipation. ? Trouble swallowing. ? Losing weight without trying to.  Problems with the heart, blood, and lungs, such as: ? Dizziness, especially when standing up. ? Fainting. ? Shortness of breath. ? Irregular heartbeat.  Bladder problems, such as: ? Trouble starting or stopping urination. ? Leaking urine. ? Trouble emptying the bladder. ? Urinary tract infections (UTIs).  Problems with other body functions, such as: ? Sweat. You may sweat too much or too little. ? Temperature. You might get hot easily. Or, you might feel cold more than usual. ? Sexual function. Men may not be able to get or maintain an erection. Women may have vaginal dryness and difficulty with arousal. Focal neuropathy Symptoms affect only one area of the body. Common symptoms include:  Numbness.  Tingling.  Burning pain.  Prickling feeling.  Very sensitive skin.  Weakness.  Inability to move (paralysis).  Muscle twitching.  Muscles getting smaller (wasting).  Poor coordination.  Double or blurred vision. Proximal neuropathy  Sudden, severe  pain in the hip, thigh, or buttocks. Pain may spread from the back into the legs (sciatica).  Pain and numbness in the arms and legs.  Tingling.  Loss of bladder control or bowel control.  Weakness and wasting of thigh muscles.  Difficulty getting up from a seated position.  Abdominal swelling.  Unexplained weight loss. How is this diagnosed? Diagnosis varies depending on the type of neuropathy your health care provider suspects. Peripheral neuropathy Your health care provider will do a neurologic exam. This exam checks your reflexes, how you move, and what you can feel. You may have other tests, such as:  Blood tests.  Tests of the fluid that surrounds the spinal cord (lumbar puncture).  CT scan.  MRI.  Checking the nerves that control muscles (electromyogram, or EMG).  Checking how quickly signals pass through your nerves (nerve conduction study).  Checking a small piece of a nerve using a microscope (biopsy). Autonomic neuropathy You may have tests, such as:  Tests to measure your blood pressure and heart rate. You may be secured to an exam table that moves you from a lying position to an upright position (table tilt test).  Breathing tests to check your lungs.  Tests to check how food moves through the digestive system (gastric emptying tests).  Blood, sweat, or urine tests.  Ultrasound of your bladder.  Spinal fluid tests. Focal neuropathy This condition may be diagnosed with:  A neurologic exam.  CT scan.  MRI.  EMG.  Nerve conduction study. Proximal neuropathy There is no test to diagnose this type of neuropathy. You may have tests to rule out other possible causes of this type of neuropathy. Tests may include:  X-rays of your spine and lumbar region.  Lumbar puncture.  MRI. How is this treated? The goal of treatment is to keep nerve damage from getting worse. Treatment may include:  Following your diabetes management plan. This will help  keep your blood glucose level and your A1C level within your target range. This is the most important treatment.  Using prescription pain medicine. Follow these instructions at home: Diabetes management Follow your diabetes management plan as told by your health care provider.  Check your blood glucose levels.  Keep your blood glucose in your target range.  Have your A1C level checked at least two times a year, or as often as told.  Take over the counter and prescription medicines only as told by your health care provider. This includes insulin and diabetes medicine.   Lifestyle  Do not use any products that contain nicotine or tobacco, such as cigarettes, e-cigarettes, and chewing tobacco. If you need help quitting, ask your health care provider.  Be physically active every day. Include strength training and balance exercises.  Follow a healthy meal plan.  Work with your health care provider to manage your blood pressure.   General instructions  Ask your health care provider if the medicine prescribed to you requires you to avoid driving or using machinery.  Check your skin and feet every day for cuts, bruises, redness, blisters, or sores.  Keep all follow-up visits. This is important. Contact a health care provider if:  You have burning, stabbing, or aching pain in your legs or feet.  You are  unable to feel pressure or pain in your feet.  You develop problems with digestion, such as: ? Nausea. ? Vomiting. ? Bloating. ? Constipation. ? Diarrhea. ? Abdominal pain.  You have difficulty with urination, such as: ? Inability to control when you urinate (incontinence). ? Inability to completely empty the bladder (retention).  You feel as if your heart is racing (palpitations).  You feel dizzy, weak, or faint when you stand up. Get help right away if:  You cannot urinate.  You have sudden weakness or loss of coordination.  You have trouble speaking.  You have pain  or pressure in your chest.  You have an irregular heartbeat.  You have sudden inability to move a part of your body. These symptoms may represent a serious problem that is an emergency. Do not wait to see if the symptoms will go away. Get medical help right away. Call your local emergency services (911 in the [place] Do not drive yourself to the hospital. Summary  Diabetic neuropathy is nerve damage that is caused by diabetes. It can cause numbness and pain in the arms, legs, digestive tract, heart, and other body systems.  This condition is treated by keeping your blood glucose level and your A1C level within your target range. This can help prevent neuropathy from getting worse.  Check your skin and feet every day for cuts, bruises, redness, blisters, or sores.  Do not use any products that contain nicotine or tobacco, such as cigarettes, e-cigarettes, and chewing tobacco. This information is not intended to replace advice given to you by your health care provider. Make sure you discuss any questions you have with your health care provider. Document Revised: 12/01/2019 Document Reviewed: 12/01/2019 Elsevier Patient Education  2021 [employer].

## 2020-12-14 ENCOUNTER — Other Ambulatory Visit: Payer: Self-pay | Admitting: Adult Health

## 2020-12-14 LAB — COMPLETE METABOLIC PANEL WITH GFR
AG Ratio: 1.8 (calc) (ref 1.0–2.5)
ALT: 42 U/L (ref 9–46)
AST: 27 U/L (ref 10–35)
Albumin: 4.4 g/dL (ref 3.6–5.1)
Alkaline phosphatase (APISO): 36 U/L (ref 35–144)
BUN/Creatinine Ratio: 31 (calc) — ABNORMAL HIGH (ref 6–22)
BUN: 27 mg/dL — ABNORMAL HIGH (ref 7–25)
CO2: 29 mmol/L (ref 20–32)
Calcium: 10.8 mg/dL — ABNORMAL HIGH (ref 8.6–10.3)
Chloride: 103 mmol/L (ref 98–110)
Creat: 0.87 mg/dL (ref 0.70–1.33)
GFR, Est African American: 113 mL/min/{1.73_m2} (ref >=60)
GFR, Est Non African American: 97 mL/min/{1.73_m2} (ref >=60)
Globulin: 2.4 g/dL (calc) (ref 1.9–3.7)
Glucose, Bld: 128 mg/dL — ABNORMAL HIGH (ref 65–99)
Potassium: 5 mmol/L (ref 3.5–5.3)
Sodium: 139 mmol/L (ref 135–146)
Total Bilirubin: 0.5 mg/dL (ref 0.2–1.2)
Total Protein: 6.8 g/dL (ref 6.1–8.1)

## 2020-12-14 LAB — CBC WITH DIFFERENTIAL/PLATELET
Absolute Monocytes: 501 cells/uL (ref 200–950)
Basophils Absolute: 23 cells/uL (ref 0–200)
Basophils Relative: 0.3 %
Eosinophils Absolute: 323 cells/uL (ref 15–500)
Eosinophils Relative: 4.2 %
HCT: 44.2 % (ref 38.5–50.0)
Hemoglobin: 15.2 g/dL (ref 13.2–17.1)
Lymphs Abs: 3450 cells/uL (ref 850–3900)
MCH: 29 pg (ref 27.0–33.0)
MCHC: 34.4 g/dL (ref 32.0–36.0)
MCV: 84.4 fL (ref 80.0–100.0)
MPV: 10.4 fL (ref 7.5–12.5)
Monocytes Relative: 6.5 %
Neutro Abs: 3403 cells/uL (ref 1500–7800)
Neutrophils Relative %: 44.2 %
Platelets: 208 10*3/uL (ref 140–400)
RBC: 5.24 10*6/uL (ref 4.20–5.80)
RDW: 13.4 % (ref 11.0–15.0)
Total Lymphocyte: 44.8 %
WBC: 7.7 10*3/uL (ref 3.8–10.8)

## 2020-12-14 LAB — LIPID PANEL
Cholesterol: 153 mg/dL (ref <200)
HDL: 28 mg/dL — ABNORMAL LOW (ref >=40)
LDL Cholesterol (Calc): 97 mg/dL (calc)
Non-HDL Cholesterol (Calc): 125 mg/dL (calc) (ref <130)
Total CHOL/HDL Ratio: 5.5 (calc) — ABNORMAL HIGH (ref <5.0)
Triglycerides: 181 mg/dL — ABNORMAL HIGH (ref <150)

## 2020-12-14 LAB — HEMOGLOBIN A1C
Hgb A1c MFr Bld: 8.2 % of total Hgb — ABNORMAL HIGH (ref <5.7)
Mean Plasma Glucose: 189 mg/dL
eAG (mmol/L): 10.4 mmol/L

## 2020-12-14 LAB — TSH: TSH: 2.09 mIU/L (ref 0.40–4.50)

## 2020-12-14 LAB — VITAMIN B12: Vitamin B-12: 1145 pg/mL — ABNORMAL HIGH (ref 200–1100)

## 2020-12-14 LAB — MAGNESIUM: Magnesium: 1.6 mg/dL (ref 1.5–2.5)

## 2020-12-14 MED ORDER — EZETIMIBE 10 MG PO TABS: 10.0000 mg | ORAL_TABLET | Freq: Every day | ORAL | 1 refills | Status: DC

## 2020-12-24 ENCOUNTER — Other Ambulatory Visit: Payer: Self-pay | Admitting: Internal Medicine

## 2020-12-24 DIAGNOSIS — E1142 Type 2 diabetes mellitus with diabetic polyneuropathy: Secondary | ICD-10-CM

## 2020-12-25 ENCOUNTER — Encounter (HOSPITAL_BASED_OUTPATIENT_CLINIC_OR_DEPARTMENT_OTHER): Payer: Self-pay

## 2020-12-25 ENCOUNTER — Emergency Department (HOSPITAL_BASED_OUTPATIENT_CLINIC_OR_DEPARTMENT_OTHER)
Admission: EM | Admit: 2020-12-25 | Discharge: 2020-12-25 | Disposition: A | Discharge status: Home / Self Care | Payer: BC Managed Care – PPO | Attending: Emergency Medicine | Admitting: Emergency Medicine

## 2020-12-25 ENCOUNTER — Other Ambulatory Visit: Payer: Self-pay

## 2020-12-25 DIAGNOSIS — Z7984 Long term (current) use of oral hypoglycemic drugs: Secondary | ICD-10-CM | POA: Insufficient documentation

## 2020-12-25 DIAGNOSIS — J45909 Unspecified asthma, uncomplicated: Secondary | ICD-10-CM | POA: Diagnosis not present

## 2020-12-25 DIAGNOSIS — Z7982 Long term (current) use of aspirin: Secondary | ICD-10-CM | POA: Diagnosis not present

## 2020-12-25 DIAGNOSIS — Z79899 Other long term (current) drug therapy: Secondary | ICD-10-CM | POA: Diagnosis not present

## 2020-12-25 DIAGNOSIS — Z87891 Personal history of nicotine dependence: Secondary | ICD-10-CM | POA: Diagnosis not present

## 2020-12-25 DIAGNOSIS — I10 Essential (primary) hypertension: Secondary | ICD-10-CM | POA: Diagnosis not present

## 2020-12-25 DIAGNOSIS — E119 Type 2 diabetes mellitus without complications: Secondary | ICD-10-CM | POA: Insufficient documentation

## 2020-12-25 DIAGNOSIS — Z794 Long term (current) use of insulin: Secondary | ICD-10-CM | POA: Insufficient documentation

## 2020-12-25 DIAGNOSIS — R112 Nausea with vomiting, unspecified: Secondary | ICD-10-CM | POA: Diagnosis not present

## 2020-12-25 DIAGNOSIS — G44209 Tension-type headache, unspecified, not intractable: Secondary | ICD-10-CM | POA: Diagnosis not present

## 2020-12-25 DIAGNOSIS — H53149 Visual discomfort, unspecified: Secondary | ICD-10-CM | POA: Insufficient documentation

## 2020-12-25 DIAGNOSIS — M542 Cervicalgia: Secondary | ICD-10-CM | POA: Diagnosis not present

## 2020-12-25 MED ORDER — METOCLOPRAMIDE HCL 5 MG/ML IJ SOLN
10.0000 mg | Freq: Once | INTRAMUSCULAR | Status: AC
  Administered 2020-12-25: 10 mg via INTRAVENOUS
  Filled 2020-12-25: qty 2

## 2020-12-25 MED ORDER — KETOROLAC TROMETHAMINE 30 MG/ML IJ SOLN
30.0000 mg | Freq: Once | INTRAMUSCULAR | Status: AC
  Administered 2020-12-25: 30 mg via INTRAVENOUS
  Filled 2020-12-25: qty 1

## 2020-12-25 MED ORDER — SODIUM CHLORIDE 0.9 % IV BOLUS
1000.0000 mL | Freq: Once | INTRAVENOUS | Status: AC
  Administered 2020-12-25: 1000 mL via INTRAVENOUS

## 2020-12-25 NOTE — ED Notes (Signed)
Green and purple tube sent to lab

## 2020-12-25 NOTE — ED Provider Notes (Signed)
[facility] Provider Note  CSN: [id number] Arrival date & time: 12/25/20 1839    History Chief Complaint  Patient presents with  . Emesis  . Headache    HPI  Shaun [name] is a 55 y.o. male with remote history of cluster headaches has not had any in several years reports gradually worsening frontal headache since he was at work yesterday. Accompanied by bilateral neck soreness and nausea/vomiting. Some photophobia and eye watering today. He admits to poor sleep recently. Headache was not sudden in onset or thunderclap. He has not had any falls or injuries. No fever, cough, congestion or CP/SOB. HE has been trying to take OTC meds at home but has vomited each time.   Past Medical History:  Diagnosis Date  . Asthma   . Diabetes mellitus without complication ([place])   . Fatty liver disease, nonalcoholic   . GERD (gastroesophageal reflux disease)   . Gout   . Hyperlipidemia   . Hypertension   . Hypogonadism male   . Obesity   . Venous insufficiency     Past Surgical History:  Procedure Laterality Date  . ANTERIOR CRUCIATE LIGAMENT REPAIR Left 2001  . CYSTOSCOPY  1993  . ESOPHAGOGASTRODUODENOSCOPY  1995, 2002   gastritis, esophagititis  . KNEE ARTHROSCOPY Right 2002, 1995    Family History  Problem Relation Age of Onset  . Diabetes Mother   . Hypertension Mother   . Gout Mother   . COPD Father   . Cancer Sister 52       appendix  . Gout Brother   . Hyperlipidemia Brother   . Hypertension Brother   . Hypertension Brother   . Hyperlipidemia Brother   . Gout Brother   . Diabetes Brother   . COPD Sister     Social History   Tobacco Use  . Smoking status: Former Smoker    Packs/day: 1.00    Years: 15.00    Pack years: 15.00    Types: Cigarettes    Quit date: 08/07/2000    Years since quitting: 20.3  . Smokeless tobacco: Never Used  Substance Use Topics  . Alcohol use: Yes    Comment: social  . Drug use: No     Home  Medications Prior to Admission medications   Medication Sig Start Date End Date Taking? Authorizing Provider  aspirin 81 MG tablet Take 81 mg by mouth daily.   Yes [provider]  atenolol (TENORMIN) 100 MG tablet Take 1 tablet Daily for BP 02/14/20  Yes Unk Pinto, MD  Blood Glucose Monitoring Suppl (CONTOUR NEXT MONITOR) w/Device KIT 1 Device by Does not apply route 3 (three) times daily. 04/01/19  Yes Unk Pinto, MD  Cholecalciferol (VITAMIN D) 2000 units CAPS Take 4 capsules by mouth daily.    Yes [provider]  Cinnamon 500 MG capsule Take 500 mg by mouth 4 (four) times daily.   Yes [provider]  Cyanocobalamin (B-12) 2500 MCG SUBL Place 1 tablet under the tongue daily.   Yes [provider]  ezetimibe (ZETIA) 10 MG tablet Take 1 tablet (10 mg total) by mouth daily. 12/14/20 12/14/21 Yes Liane Comber, NP  fenofibrate 160 MG tablet Take 1 tablet Daily for Triglycerides (Blood Fats) 02/14/20  Yes Unk Pinto, MD  glipiZIDE (GLUCOTROL) 5 MG tablet Take 1 tablet 3 x /day with Meals for Diabetes 08/14/20  Yes Unk Pinto, MD  glucose blood (CONTOUR NEXT TEST) test strip Test blood sugar  three times a day before meals 04/05/19  Yes Unk Pinto, MD  ipratropium (ATROVENT) 0.06 % nasal spray Use 1 to 2 sprays each nostril 2 to 3 x /day as needed 09/28/19  Yes Unk Pinto, MD  metFORMIN (GLUCOPHAGE-XR) 500 MG 24 hr tablet Take 2 tablets 2 x /Day with Food for Diabetes 11/11/19  Yes Unk Pinto, MD  MICROLET LANCETS MISC USE TO TEST UP TO THREE TIMES DAILY AS DIRECTED 10/15/17  Yes Unk Pinto, MD  olmesartan (BENICAR) 40 MG tablet Take 1/2 to 1 tablet at night for BP 09/28/19  Yes Unk Pinto, MD  OVER THE COUNTER MEDICATION Novolin N 70/30-- injects 35 units in the AM and 30 units in the PM.   Yes [provider]  pregabalin (LYRICA) 100 MG capsule TAKE 1 CAPSULE BY MOUTH TWICE DAILY FOR DIABETIC NERVE PAIN  12/24/20  Yes Unk Pinto, MD  Semaglutide, 1 MG/DOSE, (OZEMPIC, 1 MG/DOSE,) 2 MG/1.5ML SOPN Inject 1 mg into the skin once a week. 09/21/20  Yes Unk Pinto, MD  traZODone (DESYREL) 50 MG tablet 1/2-2 tablet at night as needed for sleep 12/13/20  Yes Liane Comber, NP     Allergies    Phentermine, Topamax [topiramate], Zocor [simvastatin], and Zoloft [sertraline hcl]   Review of Systems   Review of Systems A comprehensive review of systems was completed and negative except as noted in HPI.    Physical Exam BP (!) 143/76   Pulse 91   Temp 98.2 F (36.8 C) (Oral)   Resp 19   Ht 5' 10"  (1.778 m)   Wt 104.3 kg   SpO2 100%   BMI 33.00 kg/m   Physical Exam Vitals and nursing note reviewed.  Constitutional:      Appearance: Normal appearance.  HENT:     Head: Normocephalic and atraumatic.     Comments: Face is flushed    Nose: Nose normal.     Mouth/Throat:     Mouth: Mucous membranes are moist.  Eyes:     Extraocular Movements: Extraocular movements intact.     Conjunctiva/sclera: Conjunctivae normal.  Cardiovascular:     Rate and Rhythm: Normal rate.  Pulmonary:     Effort: Pulmonary effort is normal.     Breath sounds: Normal breath sounds.  Abdominal:     General: Abdomen is flat.     Palpations: Abdomen is soft.     Tenderness: There is no abdominal tenderness.  Musculoskeletal:        General: No swelling. Normal range of motion.     Cervical back: Neck supple. Tenderness (cervical paraspinal and trapezius muscles bilaterally) present.  Skin:    General: Skin is warm and dry.  Neurological:     General: No focal deficit present.     Mental Status: He is alert.     Cranial Nerves: No cranial nerve deficit.     Sensory: No sensory deficit.     Motor: No weakness.     Gait: Gait normal.  Psychiatric:        Mood and Affect: Mood normal.      ED Results / Procedures / Treatments   Labs (all labs ordered are listed, but only abnormal results  are displayed) Labs Reviewed - No data to display  EKG None   Radiology No results found.  Procedures Procedures  Medications Ordered in the ED Medications  ketorolac (TORADOL) 30 MG/ML injection 30 mg (30 mg Intravenous Given 12/25/20 2021)  metoCLOPramide (REGLAN) injection 10 mg (  10 mg Intravenous Given 12/25/20 2021)  sodium chloride 0.9 % bolus 1,000 mL (1,000 mLs Intravenous New Bag/Given 12/25/20 2019)     MDM Rules/Calculators/A&P MDM Patient with headache, could be cluster vs tension given his neck pain. Low suspicion for acute ICH/SAH. Will give IVF, Toradol/Reglan and reassess.  ED Course  I have reviewed the triage vital signs and the nursing notes.  Pertinent labs & imaging results that were available during my care of the patient were reviewed by me and considered in my medical decision making (see chart for details).  Clinical Course as of 12/25/20 2114  Tue Dec 25, 2020  2112 Patient reports improvement in pain with meds and IVF. Recommend he follow up with Neurology if headache persists. Motrin/APAP as needed. RTED for any other concerns.  [CS]    Clinical Course User Index [CS] Truddie Hidden, MD    Final Clinical Impression(s) / ED Diagnoses Final diagnoses:  Tension headache    Rx / DC Orders ED Discharge Orders    None       Truddie Hidden, MD 12/25/20 2114

## 2020-12-25 NOTE — ED Triage Notes (Signed)
Pt states that he started having a headache yesterday which has continued into today, pt states anything he tries to take for his headache he vomits back up, vomiting starting today. Denies diarrhea.

## 2021-01-02 LAB — HM DIABETES EYE EXAM

## 2021-01-04 ENCOUNTER — Other Ambulatory Visit: Payer: Self-pay | Admitting: Internal Medicine

## 2021-01-04 DIAGNOSIS — E119 Type 2 diabetes mellitus without complications: Secondary | ICD-10-CM

## 2021-01-04 MED ORDER — METFORMIN HCL ER 500 MG PO TB24: ORAL_TABLET | ORAL | 3 refills | Status: DC

## 2021-01-16 ENCOUNTER — Other Ambulatory Visit: Payer: Self-pay | Admitting: Adult Health

## 2021-01-16 DIAGNOSIS — G4700 Insomnia, unspecified: Secondary | ICD-10-CM

## 2021-01-16 MED ORDER — TRAZODONE HCL 150 MG PO TABS
ORAL_TABLET | ORAL | 1 refills | Status: DC
Start: 2021-01-16 — End: 2022-04-09

## 2021-02-10 ENCOUNTER — Other Ambulatory Visit: Payer: Self-pay | Admitting: Internal Medicine

## 2021-02-10 DIAGNOSIS — E1165 Type 2 diabetes mellitus with hyperglycemia: Secondary | ICD-10-CM

## 2021-02-10 DIAGNOSIS — I10 Essential (primary) hypertension: Secondary | ICD-10-CM

## 2021-02-10 DIAGNOSIS — E782 Mixed hyperlipidemia: Secondary | ICD-10-CM

## 2021-02-10 DIAGNOSIS — E119 Type 2 diabetes mellitus without complications: Secondary | ICD-10-CM

## 2021-02-10 MED ORDER — FENOFIBRATE 160 MG PO TABS: ORAL_TABLET | ORAL | 3 refills | Status: DC

## 2021-02-10 MED ORDER — GLIPIZIDE 5 MG PO TABS: ORAL_TABLET | ORAL | 0 refills | Status: DC

## 2021-03-05 ENCOUNTER — Other Ambulatory Visit: Payer: Self-pay | Admitting: Internal Medicine

## 2021-03-05 DIAGNOSIS — E1165 Type 2 diabetes mellitus with hyperglycemia: Secondary | ICD-10-CM

## 2021-03-19 ENCOUNTER — Ambulatory Visit: Payer: BC Managed Care – PPO | Admitting: Internal Medicine

## 2021-03-19 ENCOUNTER — Encounter: Payer: Self-pay | Admitting: Internal Medicine

## 2021-03-19 ENCOUNTER — Other Ambulatory Visit: Payer: Self-pay

## 2021-03-19 VITALS — BP 148/90 | HR 90 | Temp 97.6°F | Resp 16 | Ht 70.0 in | Wt 246.6 lb

## 2021-03-19 DIAGNOSIS — E1165 Type 2 diabetes mellitus with hyperglycemia: Secondary | ICD-10-CM

## 2021-03-19 DIAGNOSIS — Z794 Long term (current) use of insulin: Secondary | ICD-10-CM

## 2021-03-19 DIAGNOSIS — Z79899 Other long term (current) drug therapy: Secondary | ICD-10-CM

## 2021-03-19 DIAGNOSIS — E1142 Type 2 diabetes mellitus with diabetic polyneuropathy: Secondary | ICD-10-CM

## 2021-03-19 DIAGNOSIS — E1122 Type 2 diabetes mellitus with diabetic chronic kidney disease: Secondary | ICD-10-CM

## 2021-03-19 DIAGNOSIS — E785 Hyperlipidemia, unspecified: Secondary | ICD-10-CM

## 2021-03-19 DIAGNOSIS — I10 Essential (primary) hypertension: Secondary | ICD-10-CM | POA: Diagnosis not present

## 2021-03-19 DIAGNOSIS — M1000 Idiopathic gout, unspecified site: Secondary | ICD-10-CM | POA: Diagnosis not present

## 2021-03-19 DIAGNOSIS — E1140 Type 2 diabetes mellitus with diabetic neuropathy, unspecified: Secondary | ICD-10-CM

## 2021-03-19 DIAGNOSIS — E1169 Type 2 diabetes mellitus with other specified complication: Secondary | ICD-10-CM | POA: Diagnosis not present

## 2021-03-19 DIAGNOSIS — E559 Vitamin D deficiency, unspecified: Secondary | ICD-10-CM | POA: Diagnosis not present

## 2021-03-19 DIAGNOSIS — N182 Chronic kidney disease, stage 2 (mild): Secondary | ICD-10-CM

## 2021-03-19 MED ORDER — OZEMPIC (2 MG/DOSE) 8 MG/3ML ~~LOC~~ SOPN: 2.0000 mg | PEN_INJECTOR | SUBCUTANEOUS | 3 refills | Status: DC

## 2021-03-19 MED ORDER — PREGABALIN 100 MG PO CAPS: ORAL_CAPSULE | ORAL | 1 refills | Status: DC

## 2021-03-19 NOTE — Progress Notes (Addendum)
Future Appointments  Date Time Provider Department Center  03/19/2021 -  6 mo   4:00 PM [name], MD [facility] None  09/05/2021   -  CPE  2:00 PM [name], MD [facility] None    History of Present Illness:       This very nice 55 y.o. [name] presents for 6  month follow up with HTN, HLD, Pre-Diabetes and Vitamin D Deficiency. Patient has hx/o Gout, but is off of meds.   Is elevated at 148/90. Patient has had no complaints of any cardiac type chest pain, palpitations, dyspnea / orthopnea / PND, dizziness, claudication, or dependent edema.       Hyperlipidemia is controlled with diet & meds. Patient denies myalgias or other med SE's. Last Lipids were near goal except elevated Trig's:  Lab Results  Component Value Date   CHOL 153 12/13/2020   HDL 28 (L) 12/13/2020   LDLCALC 97 12/13/2020   TRIG 181 (H) 12/13/2020   CHOLHDL 5.5 (H) 12/13/2020     Also, the patient has history of  Insulin Dependent T2_DM (2004)w/CKD2.  In Feb 2020, patient was started on Novolin 70/30.  He was started on Ozempic in Nov 2020 (weight was 250#) .   and has had no symptoms of reactive hypoglycemia, diabetic polys or visual blurring, but he does have painful burning dysthesias of the distal LE's not controlled on his current dose of Lyrica.   Last A1c was not at goal:  Lab Results  Component Value Date   HGBA1C 8.2 (H) 12/13/2020   Wt Readings from Last 3 Encounters:  03/19/21 246 lb 9.6 oz (111.9 kg)  12/25/20 230 lb (104.3 kg)  12/13/20 238 lb (108 kg)                                                             Further, the patient also has history of Vitamin D Deficiency ("15" /2009) and supplements vitamin D without any suspected side-effects. Last vitamin D was at goal:  Lab Results  Component Value Date   VD25OH 67 08/27/2020     Current Outpatient Medications on File Prior to Visit  Medication Sig   aspirin 81 MG tablet Take daily.   atenolol 100 MG tablet Take  1  tablet  Daily for BP    VITAMIN D 2000 u Take 4 capsules daily.    Cinnamon 500 MG caps Take 4 times daily.   B-12 2500 MCG SL Place 1 tablet under the tongue daily.   ezetimibe  10 MG tablet Take 1 tablet daily.   fenofibrate 160 MG tablet Take 1 tablet Daily    glipiZIDE 5 MG tablet Take 1 tablet 3 x /day with Meals   ATROVENT  0.06 % nasal spray Use 1 to 2 sprays each nostril 2 to 3 x /day as needed   metFORMIN -XR 500 MG  Take 2 tablets 2 x /Day   olmesartan 40 MG tablet Take 1/2 to 1 tablet at night for BP   Novolin N 70/30 -injects 35 units in the AM and 30 units in the PM.   OZEMPIC, 1 MG/DOSE, 4 MG/3ML  INJECT 1 MG INTO THE SKIN ONCE A WEEK   pregabalin (LYRICA) 100 MG capsule TAKE 1 CAPSULE  TWICE DAILY    traZODone 150 MG tablet 1/2-1 tablet at night as needed for sleep     Allergies  Allergen Reactions   Phentermine Other (See Comments)    Vision loss   Topamax [Topiramate] Other (See Comments)    Vision changes/loss of vision   Zocor [Simvastatin]     Headache   Zoloft [Sertraline Hcl]     dysphona     PMHx:   Past Medical History:  Diagnosis Date   Asthma    Diabetes mellitus without complication (HCC)    Fatty liver disease, nonalcoholic    GERD (gastroesophageal reflux disease)    Gout    Hyperlipidemia    Hypertension    Hypogonadism male    Obesity    Venous insufficiency      Immunization History  Administered Date(s) Administered   PPD Test 06/21/2018, 07/27/2019, 08/27/2020   Pneumococcal -23 07/27/2019   Pneumococcal-23 02/09/2013   Tdap 02/09/2013     Past Surgical History:  Procedure Laterality Date   ANTERIOR CRUCIATE LIGAMENT REPAIR Left 2001   CYSTOSCOPY  1993   ESOPHAGOGASTRODUODENOSCOPY  1995, 2002   gastritis, esophagititis   KNEE ARTHROSCOPY Right 2002, 1995    FHx:    Reviewed / unchanged  SHx:    Reviewed / unchanged   Systems Review:  Constitutional: Denies fever, chills, wt changes, headaches, insomnia, fatigue,  night sweats, change in appetite. Eyes: Denies redness, blurred vision, diplopia, discharge, itchy, watery eyes.  ENT: Denies discharge, congestion, post nasal drip, epistaxis, sore throat, earache, hearing loss, dental pain, tinnitus, vertigo, sinus pain, snoring.  CV: Denies chest pain, palpitations, irregular heartbeat, syncope, dyspnea, diaphoresis, orthopnea, PND, claudication or edema. Respiratory: denies cough, dyspnea, DOE, pleurisy, hoarseness, laryngitis, wheezing.  Gastrointestinal: Denies dysphagia, odynophagia, heartburn, reflux, water brash, abdominal pain or cramps, nausea, vomiting, bloating, diarrhea, constipation, hematemesis, melena, hematochezia  or hemorrhoids. Genitourinary: Denies dysuria, frequency, urgency, nocturia, hesitancy, discharge, hematuria or flank pain. Musculoskeletal: Denies arthralgias, myalgias, stiffness, jt. swelling, pain, limping or strain/sprain.  Skin: Denies pruritus, rash, hives, warts, acne, eczema or change in skin lesion(s). Neuro: No weakness, tremor, incoordination, spasms, paresthesia or pain. Psychiatric: Denies confusion, memory loss or sensory loss. Endo: Denies change in weight, skin or hair change.  Heme/Lymph: No excessive bleeding, bruising or enlarged lymph nodes.  Physical Exam  BP (!) 148/90   Pulse 90   Temp 97.6 F (36.4 C)   Resp 16   Ht 5\' 10"  (1.778 m)   Wt 246 lb 9.6 oz (111.9 kg)   SpO2 99%   BMI 35.38 kg/m   Appears  well nourished, well groomed  and in no distress.  Eyes: PERRLA, EOMs, conjunctiva no swelling or erythema. Sinuses: No frontal/maxillary tenderness ENT/Mouth: EAC's clear, TM's nl w/o erythema, bulging. Nares clear w/o erythema, swelling, exudates. Oropharynx clear without erythema or exudates. Oral hygiene is good. Tongue normal, non obstructing. Hearing intact.  Neck: Supple. Thyroid not palpable. Car 2+/2+ without bruits, nodes or JVD. Chest: Respirations nl with BS clear & equal w/o rales,  rhonchi, wheezing or stridor.  Cor: Heart sounds normal w/ regular rate and rhythm without sig. murmurs, gallops, clicks or rubs. Peripheral pulses normal and equal  without edema.  Abdomen: Soft & bowel sounds normal. Non-tender w/o guarding, rebound, hernias, masses or organomegaly.  Lymphatics: Unremarkable.  Musculoskeletal: Full ROM all peripheral extremities, joint stability, 5/5 strength and normal gait.  Skin: Warm, dry without exposed rashes, lesions or ecchymosis apparent.  Neuro: Cranial nerves intact,  reflexes equal bilaterally. Sensory-motor testing grossly intact. Tendon reflexes grossly intact.  Pysch: Alert & oriented x 3.  Insight and judgement nl & appropriate. No ideations.  Assessment and Plan:  1. Essential hypertension  - Continue medication, monitor blood pressure at home.  - Continue DASH diet.  Reminder to go to the ER if any CP,  SOB, nausea, dizziness, severe HA, changes vision/speech.   - CBC with Differential/Platelet - COMPLETE METABOLIC PANEL WITH GFR - Magnesium - TSH  2. Hyperlipidemia associated with type 2 diabetes mellitus (HCC)  - Continue diet/meds, exercise,& lifestyle modifications.  - Continue monitor periodic cholesterol/liver & renal functions    - Lipid panel - TSH  3. Type 2 diabetes mellitus with stage 2 chronic kidney  disease, with long-term current use of insulin (HCC)  - Continue diet, exercise  - Lifestyle modifications.  - Monitor appropriate labs   - Hemoglobin A1c  4. Vitamin D deficiency  - Continue supplementation.    - VITAMIN D 25 Hydroxy   5. Idiopathic gout, hx  - Uric acid  6. Medication management  - CBC with Differential/Platelet - COMPLETE METABOLIC PANEL WITH GFR - Magnesium - Lipid panel - TSH - Hemoglobin A1c - VITAMIN D 25 Hydroxy  - Uric acid        Discussed  regular exercise, BP monitoring, weight control to achieve/maintain BMI less than 25 and discussed med and SE's. Recommended labs  to assess and monitor clinical status with further disposition pending results of labs.  I discussed the assessment and treatment plan with the patient. The patient was provided an opportunity to ask questions and all were answered. The patient agreed with the plan and demonstrated an understanding of the instructions.  I provided over 30 minutes of exam, counseling, chart review and  complex critical decision making.        The patient was advised to call back or seek an in-person evaluation if the symptoms worsen or if the condition fails to improve as anticipated.   Marinus Maw, MD

## 2021-03-19 NOTE — Patient Instructions (Signed)

## 2021-03-20 LAB — CBC WITH DIFFERENTIAL/PLATELET
Absolute Monocytes: 449 cells/uL (ref 200–950)
Basophils Absolute: 40 cells/uL (ref 0–200)
Basophils Relative: 0.6 %
Eosinophils Absolute: 231 cells/uL (ref 15–500)
Eosinophils Relative: 3.5 %
HCT: 44.1 % (ref 38.5–50.0)
Hemoglobin: 14.4 g/dL (ref 13.2–17.1)
Lymphs Abs: 2891 cells/uL (ref 850–3900)
MCH: 27.7 pg (ref 27.0–33.0)
MCHC: 32.7 g/dL (ref 32.0–36.0)
MCV: 85 fL (ref 80.0–100.0)
MPV: 10.4 fL (ref 7.5–12.5)
Monocytes Relative: 6.8 %
Neutro Abs: 2990 cells/uL (ref 1500–7800)
Neutrophils Relative %: 45.3 %
Platelets: 243 10*3/uL (ref 140–400)
RBC: 5.19 10*6/uL (ref 4.20–5.80)
RDW: 13.4 % (ref 11.0–15.0)
Total Lymphocyte: 43.8 %
WBC: 6.6 10*3/uL (ref 3.8–10.8)

## 2021-03-20 LAB — LIPID PANEL
Cholesterol: 116 mg/dL (ref <200)
HDL: 23 mg/dL — ABNORMAL LOW (ref >=40)
LDL Cholesterol (Calc): 62 mg/dL (calc)
Non-HDL Cholesterol (Calc): 93 mg/dL (calc) (ref <130)
Total CHOL/HDL Ratio: 5 (calc) — ABNORMAL HIGH (ref <5.0)
Triglycerides: 298 mg/dL — ABNORMAL HIGH (ref <150)

## 2021-03-20 LAB — COMPLETE METABOLIC PANEL WITH GFR
AG Ratio: 1.9 (calc) (ref 1.0–2.5)
ALT: 34 U/L (ref 9–46)
AST: 34 U/L (ref 10–35)
Albumin: 4.4 g/dL (ref 3.6–5.1)
Alkaline phosphatase (APISO): 39 U/L (ref 35–144)
BUN: 22 mg/dL (ref 7–25)
CO2: 29 mmol/L (ref 20–32)
Calcium: 10.4 mg/dL — ABNORMAL HIGH (ref 8.6–10.3)
Chloride: 102 mmol/L (ref 98–110)
Creat: 1.08 mg/dL (ref 0.70–1.30)
Globulin: 2.3 g/dL (calc) (ref 1.9–3.7)
Glucose, Bld: 73 mg/dL (ref 65–99)
Potassium: 4.4 mmol/L (ref 3.5–5.3)
Sodium: 138 mmol/L (ref 135–146)
Total Bilirubin: 0.4 mg/dL (ref 0.2–1.2)
Total Protein: 6.7 g/dL (ref 6.1–8.1)
eGFR: 81 mL/min/{1.73_m2} (ref >=60)

## 2021-03-20 LAB — TSH: TSH: 1.75 mIU/L (ref 0.40–4.50)

## 2021-03-20 LAB — URIC ACID: Uric Acid, Serum: 6.3 mg/dL (ref 4.0–8.0)

## 2021-03-20 LAB — VITAMIN D 25 HYDROXY (VIT D DEFICIENCY, FRACTURES): Vit D, 25-Hydroxy: 70 ng/mL (ref 30–100)

## 2021-03-20 LAB — MAGNESIUM: Magnesium: 1.7 mg/dL (ref 1.5–2.5)

## 2021-03-20 LAB — HEMOGLOBIN A1C
Hgb A1c MFr Bld: 7.8 % of total Hgb — ABNORMAL HIGH (ref <5.7)
Mean Plasma Glucose: 177 mg/dL
eAG (mmol/L): 9.8 mmol/L

## 2021-03-20 NOTE — Progress Notes (Signed)
============================================================ -   Test results slightly outside the reference range are not unusual. If there is anything important, I will review this with you,  otherwise it is considered normal test values.  If you have further questions,  please do not hesitate to contact me at the office or via My Chart.  ============================================================ ============================================================  -   Magnesium  = 1.7    is  very  low - goal is betw 2.0 - 2.5,   - So ...Marland KitchenMarland KitchenMarland Kitchen Recommend that you take  Magnesium 500 mg tablet x 2 tablets daily   - also important to eat lots of  leafy green vegetables   - spinach - Kale - collards - greens - okra - asparagus  - broccoli - quinoa - squash - almonds   - black, red, white beans  -  peas - green beans ============================================================ ============================================================  -  Total Chol = 117   &   LDL Chol = 62 - Both  Excellent   - Very low risk for Heart Attack  / Stroke ============================================================ ============================================================  -  But . . . . . . .  Triglycerides (   298     ) or fats in blood are too high  (goal is less than 150)    - Recommend avoid fried & greasy foods,  sweets / candy,   - Avoid white rice  (brown or wild rice or Quinoa is OK),   - Avoid white potatoes  (sweet potatoes are OK)   - Avoid anything made from white flour  - bagels, doughnuts, rolls, buns, biscuits, white and   wheat breads, pizza crust and traditional  pasta made of white flour & egg white  - (vegetarian pasta or spinach or wheat pasta is OK).    - Multi-grain bread is OK - like multi-grain flat bread or  sandwich thins.   - Avoid alcohol in excess.   - Exercise is also  important. ============================================================ ============================================================  -  A1c was 8.2% and not down significantly to now 7.8%    - Hopefully will dramatically improve as  increase  QWzempic  ============================================================ ============================================================  -  Vitamin D = 70   -  Excellent - Please keep dose same  !  ============================================================ ============================================================  -  Uric Acid / Gout Test is Normal & OK  ============================================================ ============================================================  -  All Else - CBC - Kidneys - Electrolytes - Liver - Magnesium & Thyroid    - all  Normal / OK ============================================================ ============================================================

## 2021-03-31 ENCOUNTER — Other Ambulatory Visit: Payer: Self-pay | Admitting: Adult Health

## 2021-03-31 DIAGNOSIS — E782 Mixed hyperlipidemia: Secondary | ICD-10-CM

## 2021-04-11 IMAGING — CT CT RENAL STONE PROTOCOL
2 of 4 series · 17 of 46 positions shown, 19 images · non-contrast
Comparison: None.

CLINICAL DATA: Left-sided flank pain

EXAM:
CT ABDOMEN AND PELVIS WITHOUT CONTRAST
TECHNIQUE: Multidetector CT imaging of the abdomen and pelvis was performed
following the standard protocol without IV contrast.

[Series 2: axial st · axial · 0.86mm/px · z∈[+634,+1099]mm · 14 of 103 slices shown, 16 images]
[im 5/103  soft-tissue]
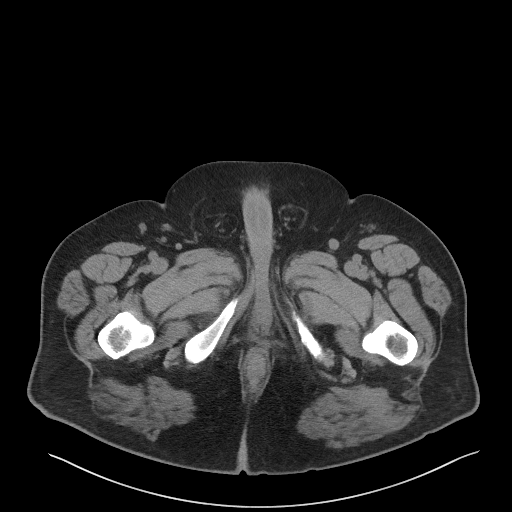
[im 5/103  bone]
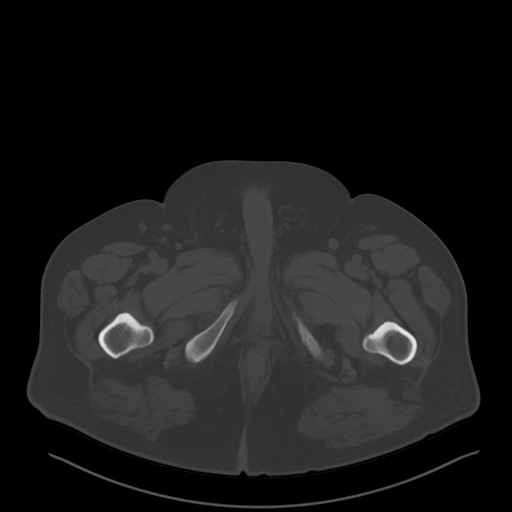
[im 14/103  soft-tissue]
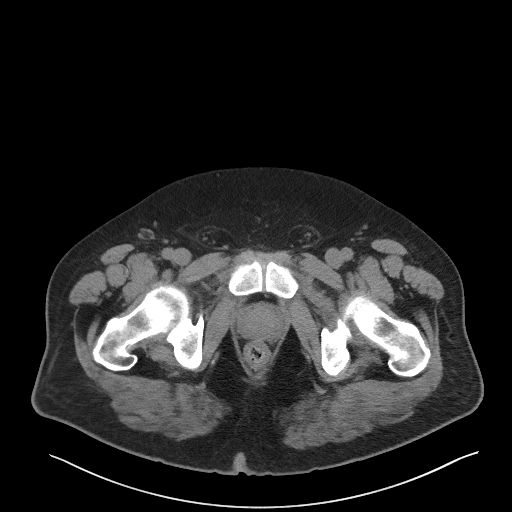
[im 18/103  soft-tissue]
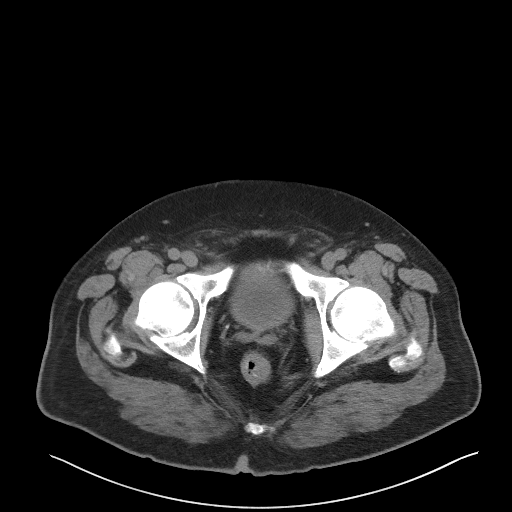
[im 27/103  soft-tissue]
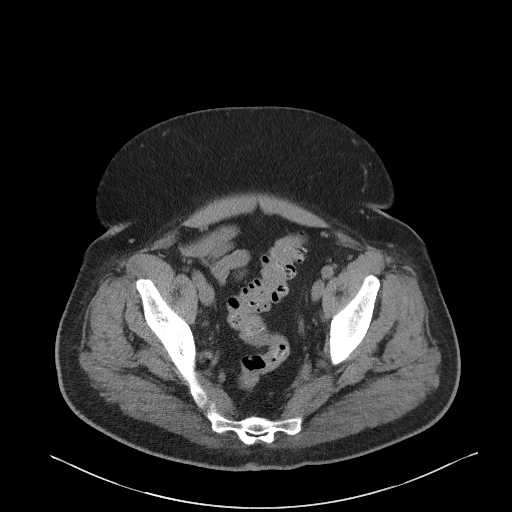
[im 36/103  soft-tissue]
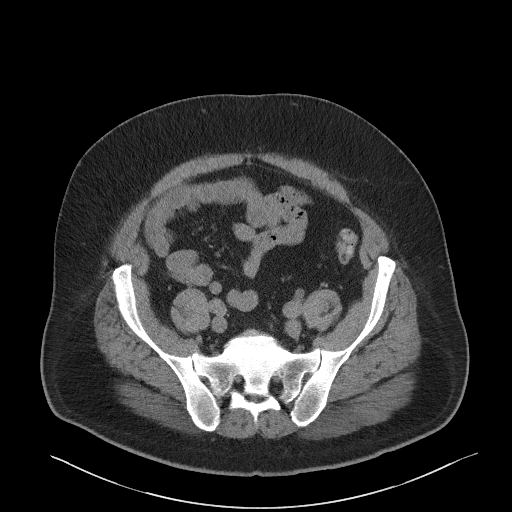
[im 40/103  soft-tissue]
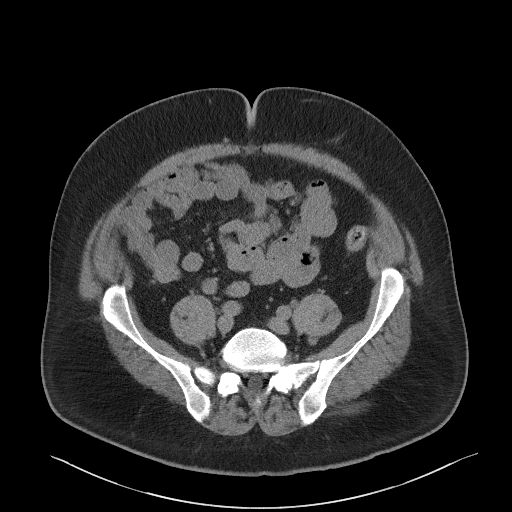
[im 49/103  soft-tissue]
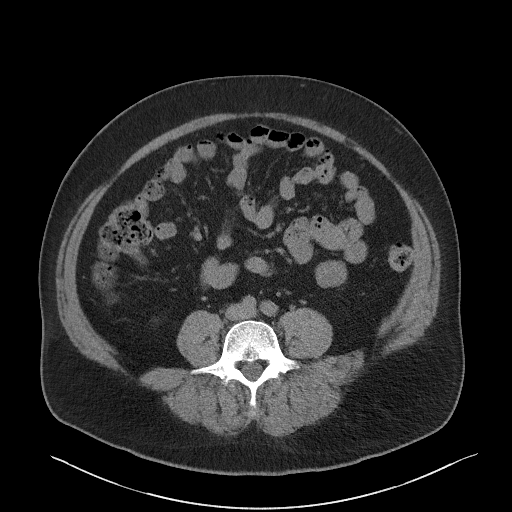
[im 54/103  soft-tissue]
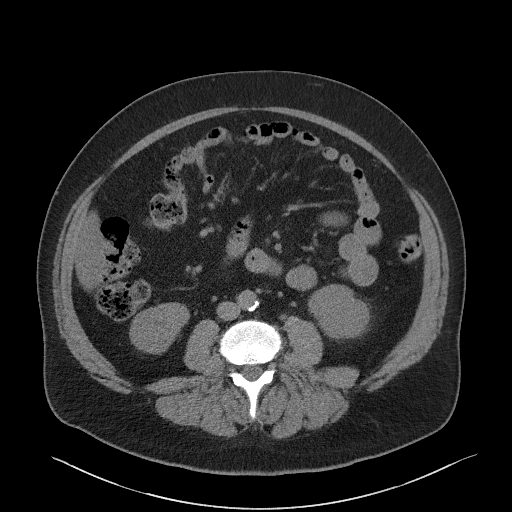
[im 63/103  soft-tissue]
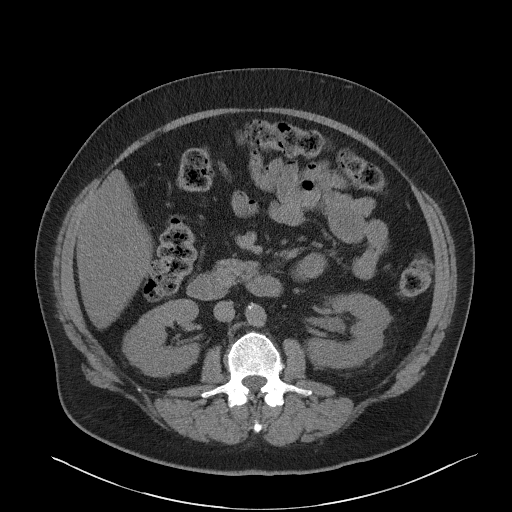
[im 63/103  bone]
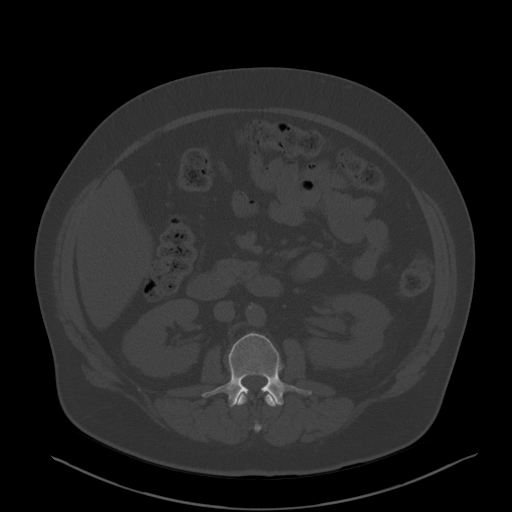
[im 67/103  soft-tissue]
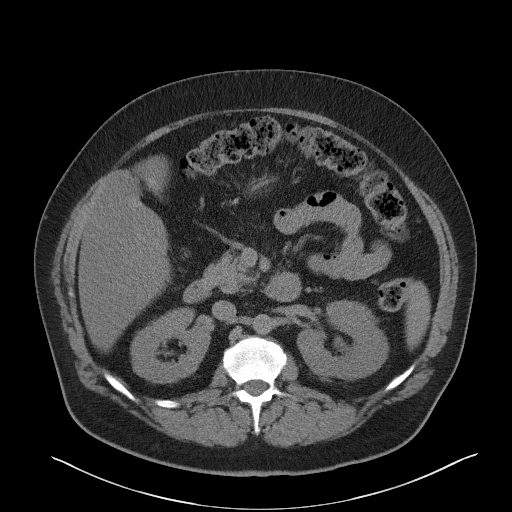
[im 76/103  soft-tissue]
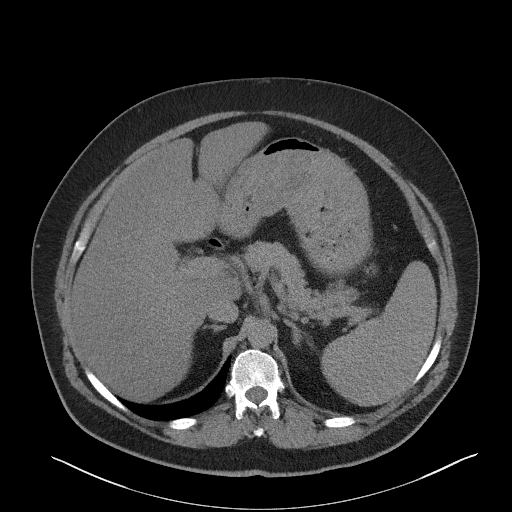
[im 85/103  soft-tissue]
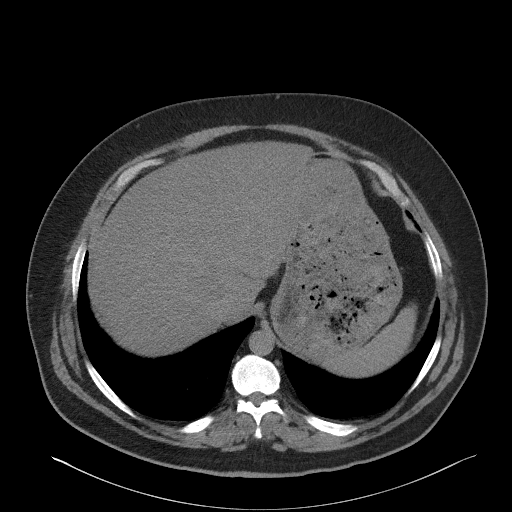
[im 89/103  soft-tissue]
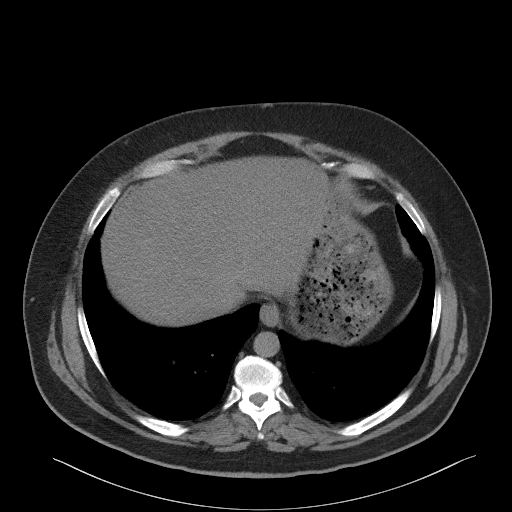
[im 98/103  soft-tissue]
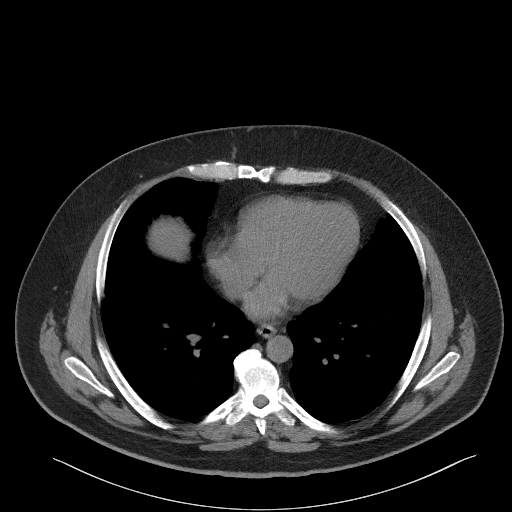

[Series 4: coronal st · coronal · 0.96mm/px · 3 of 108 slices shown]
[im 36/108  soft-tissue]
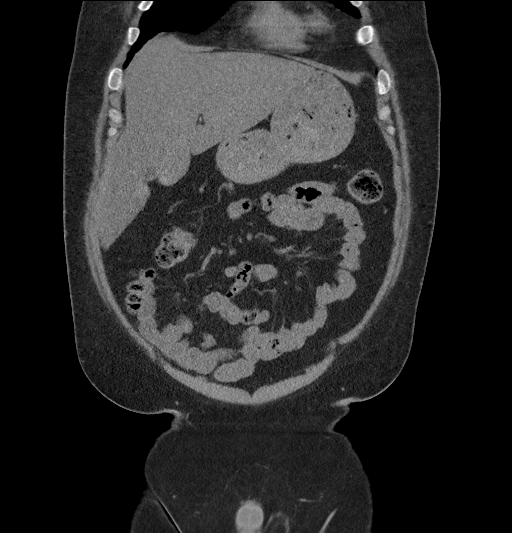
[im 48/108  soft-tissue]
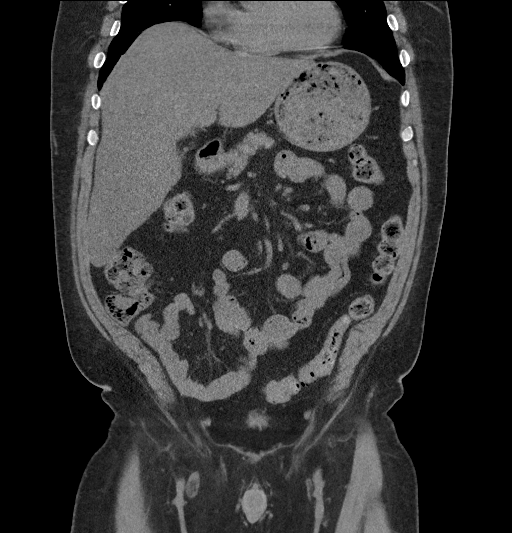
[im 60/108  soft-tissue]
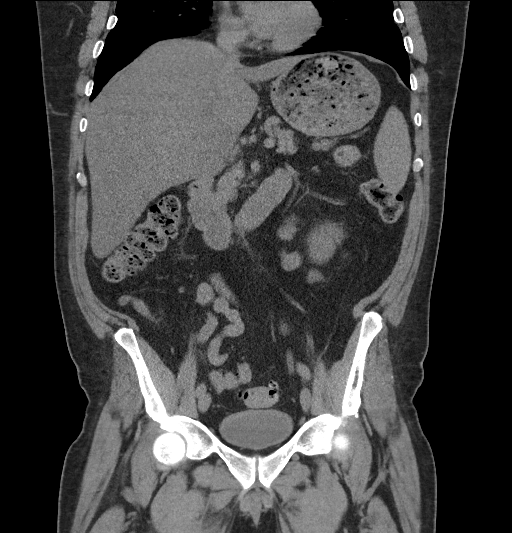

[17 of 46 positions shown; findings below may reference images not displayed]

FINDINGS: Lower chest: No acute abnormality.

Hepatobiliary: Fatty infiltration of the liver is noted. The
gallbladder is decompressed.

Pancreas: Unremarkable. No pancreatic ductal dilatation or
surrounding inflammatory changes.

Spleen: Normal in size without focal abnormality.

Adrenals/Urinary Tract: Adrenal glands are within normal limits.
Kidneys are well visualized bilaterally. No renal calculi are noted.
Mild hydronephrosis and hydroureter is noted on the left. This
extends to the level of the ureterovesical junction and in the
distal ureter there is a small 3 mm stone identified causing the
obstructive change. The bladder is within normal limits.

Stomach/Bowel: Scattered diverticular change of the colon is noted
without diverticulitis. No inflammatory changes are seen. The
appendix is within normal limits. Small bowel and stomach are
unremarkable.

Vascular/Lymphatic: Aortic atherosclerosis. No enlarged abdominal or
pelvic lymph nodes.

Reproductive: Prostate is unremarkable.

Other: No abdominal wall hernia or abnormality. No abdominopelvic
ascites.

Musculoskeletal: Degenerative changes of lumbar spine are noted.
IMPRESSION: 3 mm distal left ureteral stone with mild hydronephrosis and
hydroureter.

Diverticulosis without diverticulitis.

Fatty liver.

## 2021-04-12 ENCOUNTER — Other Ambulatory Visit: Payer: Self-pay | Admitting: Internal Medicine

## 2021-04-12 MED ORDER — MOUNJARO 15 MG/0.5ML ~~LOC~~ SOAJ: 15.0000 mg | SUBCUTANEOUS | 3 refills | Status: DC

## 2021-04-16 ENCOUNTER — Telehealth: Payer: Self-pay

## 2021-04-16 NOTE — Telephone Encounter (Signed)
A Prior Authorization was submitted today for Mounjaro 15 mg/0.5 ML Pen Injectors through Cover [employer].

## 2021-05-08 ENCOUNTER — Encounter: Payer: Self-pay | Admitting: Internal Medicine

## 2021-05-23 ENCOUNTER — Other Ambulatory Visit: Payer: Self-pay

## 2021-05-23 DIAGNOSIS — E1165 Type 2 diabetes mellitus with hyperglycemia: Secondary | ICD-10-CM

## 2021-05-23 MED ORDER — GLIPIZIDE 5 MG PO TABS: ORAL_TABLET | ORAL | 0 refills | Status: DC

## 2021-07-02 NOTE — Progress Notes (Signed)
FOLLOW UP  Assessment and Plan:    Essential hypertension - continue medications, DASH diet, exercise and monitor at home. Call if greater than 130/80.  -     CBC with Differential/Platelet -     COMPLETE METABOLIC PANEL WITH GFR -     Magnesium   T2_NIDDM Education: Reviewed 'ABCs' of diabetes management (respective goals in parentheses):  A1C (<7), blood pressure (<130/80), and cholesterol (LDL <70) Eye Exam yearly and Dental Exam every 6 months. Dietary recommendations - high fiber, lean protein, low starch/sugar Physical Activity recommendations - Continue checking glucose BID - check with new formulary on CGM - continue current meds- check with pharmacy if they have trulicity 3 mg, 4.5 mg, mounjaro 12.5 mg or 15 mg; continue ozempic in the interim - emphasized need for better dietary choices, he is receptive, knows he needs to and can do better - Continue insulin/glipizide but be mindful of low sugars - ideally would recommend taper off both, transition to basal insulin if able  -     Hemoglobin A1c  Hyperlipidemia associated with T2DM (HCC) check lipids, on fenofibrate/zeita, not currently on statin as has been at LDL goal by lifestyle, hx of statin SE and prefers to avoid if possible decrease fatty foods increase activity.  -     Lipid panel -     TSH  Diabetic polyneuropathy associated with type 2 diabetes mellitus (HCC) - improve sugars, daily foot checks emphasized, continue lyrica -     pregabalin (LYRICA) 100 MG capsule; Take 1 capsule (100 mg total) by mouth 2 (two) times daily.  B12 def Continue supplement;   Vitamin D def - Continue supplement, at goal recent check  Insomnia - continue lyrica, trazodone for sleep   Need for influenza vaccine Quadrivalent flu vaccine administered without complication today    Continue diet and meds as discussed. Further disposition pending results of labs. Discussed med's effects and SE's.   Over 30 minutes of exam,  counseling, chart review, and critical decision making was performed.   Future Appointments  Date Time Provider [place]  10/25/2021 11:00 AM Unk Pinto, MD [facility] None    ----------------------------------------------------------------------------------------------------------------------  HPI 55 y.o. male  presents for 3 month follow up on hypertension, cholesterol, T2 diabetes, obesity and vitamin D deficiency.   Lost his daughter March 2021, his ex wife is Engineer, manufacturing systems. Just had memorials, doing ok, declines meds. Does use trazodone intermittently.   He has chronic lower back pain; follows with Dr. Lorin Mercy, surgery has been discussed but deferring due to poorly controlled diabetes.  He uses lyrica 100 mg PRN for neuropathic pains.   BMI is Body mass index is 36.33 kg/m., weights trending up, admits he has not been working on diet and exercise, works long hours and limited by local options, holidays not helping, but could do better.  Had cheese/egg/sausage biscuit for breakfast, grilled chicken salad with ranch for lunch, chicken sandwich for dinner.  He does take off half the bread intermittently, knows could do better.  Examples given for locally available options-  No soda/sweet tea, diet only.  Wt Readings from Last 3 Encounters:  07/03/21 253 lb 3.2 oz (114.9 kg)  03/19/21 246 lb 9.6 oz (111.9 kg)  12/25/20 230 lb (104.3 kg)   His blood pressure has been controlled at home, today their BP is BP: 126/76 Denies CP, dyspnea, dizziness.    He is not on cholesterol medication other than fenofibrate and denies myalgias. HA with simvastatin. His cholesterol  is at goal of less than 70. The cholesterol last visit was:   Lab Results  Component Value Date   CHOL 116 03/19/2021   HDL 23 (L) 03/19/2021   LDLCALC 62 03/19/2021   TRIG 298 (H) 03/19/2021   CHOLHDL 5.0 (H) 03/19/2021    He has been working on diet and exercise for T2 diabetes [facility] -  [facility]/02/2021 - retinopathy  He is on ARB, no CKD Hyperlipidemia, not on med, at goal he is on fenofibrate He does have neuropathy, on lyrica 100 mg BID, has tried gabapentin He is on insulin 70/30 30-35 in AM, 20-25 PM,  depends on glucose. Mounjaro - difficulty filling Ozempic - 1 mg/week, will do 2 mg when available  Metformin 519m 4 a day He is on glipizide 5 mg 3 x a day, avoids at bed time.  Denies low sugars since backing off on insulin, titrates PRN denies hypoglycemia , increased appetite, nausea, paresthesia of the feet, polydipsia, polyuria, visual disturbances, vomiting and weight loss.  Fasting: 140-250, very labile Prior to dinner: labile 160-200+ Uses contour testing strip He was Last A1C in the office was:  Lab Results  Component Value Date   HGBA1C 7.8 (H) 03/19/2021   Lab Results  Component Value Date   GFRNONAA 97 12/13/2020   Patient is on Vitamin D supplement and at goal at recent check:    Lab Results  Component Value Date   VD25OH 74208/16/2022     He reports is newly taking 2500 mcg SL B12 per Dr. MMelford Aaserecommendation Lab Results  Component Value Date   VITAMINB12 1,145 (H) 12/13/2020   Remote hx of gout flare; none in several years since cutting out alcohol, not on medications.  Lab Results  Component Value Date   LABURIC 6.3 03/19/2021     Current Medications:   Current Outpatient Medications (Endocrine & Metabolic):    glipiZIDE (GLUCOTROL) 5 MG tablet, Take 1 tablet 3 x /day with Meals for Diabetes / Patient knows to take by mouth   metFORMIN (GLUCOPHAGE-XR) 500 MG 24 hr tablet, Take 2 tablets 2 x /Day with Food for Diabetes   Semaglutide (OZEMPIC, 1 MG/DOSE, [place]), Inject into the skin.  Current Outpatient Medications (Cardiovascular):    atenolol (TENORMIN) 100 MG tablet, Take  1 tablet  Daily for BP  / Patient knows to take by mouth   ezetimibe (ZETIA) 10 MG tablet, Take 1 tablet Daily for Cholesterol  / Patient knows to take by mouth    fenofibrate 160 MG tablet, Take 1 tablet Daily for Triglycerides (Blood Fats) / Patient knows to take by mouth   olmesartan (BENICAR) 40 MG tablet, Take 1/2 to 1 tablet at night for BP  Current Outpatient Medications (Respiratory):    ipratropium (ATROVENT) 0.06 % nasal spray, Use 1 to 2 sprays each nostril 2 to 3 x /day as needed  Current Outpatient Medications (Analgesics):    aspirin 81 MG tablet, Take 81 mg by mouth daily.  Current Outpatient Medications (Hematological):    Cyanocobalamin (B-12) 2500 MCG SUBL, Place 1 tablet under the tongue daily.  Current Outpatient Medications (Other):    Cholecalciferol (VITAMIN D) 2000 units CAPS, Take 4 capsules by mouth daily.    Cinnamon 500 MG capsule, Take 500 mg by mouth 4 (four) times daily.   glucose blood (CONTOUR NEXT TEST) test strip, Test blood sugar  three times a day before meals   MICROLET LANCETS MISC, USE TO TEST UP  TO THREE TIMES DAILY AS DIRECTED   OVER THE COUNTER MEDICATION, Novolin N 70/30-- injects 35 units in the AM and 30 units in the PM.   pregabalin (LYRICA) 100 MG capsule, Take  1 capsule  2 x /day  with Bkfst & Lunch  and 2 capsules  at Bedtime  for Painful Diabetic Neuropathy   traZODone (DESYREL) 150 MG tablet, 1/2-1 tablet at night as needed for sleep   Blood Glucose Monitoring Suppl (CONTOUR NEXT MONITOR) w/Device KIT, 1 Device by Does not apply route 3 (three) times daily.   Allergies:  Allergies  Allergen Reactions   Phentermine Other (See Comments)    Vision loss   Topamax [Topiramate] Other (See Comments)    Vision changes/loss of vision   Zocor [Simvastatin]     Headache   Zoloft [Sertraline Hcl]     dysphona     Medical History:  Past Medical History:  Diagnosis Date   Asthma    Diabetes mellitus without complication ([place])    Fatty liver disease, nonalcoholic    GERD (gastroesophageal reflux disease)    Gout    Hyperlipidemia    Hypertension    Hypogonadism male    Obesity    Venous  insufficiency    Family history- Reviewed and unchanged Social history- Reviewed and unchanged   Review of Systems:  Review of Systems  Constitutional:  Negative for malaise/fatigue and weight loss.  HENT:  Negative for congestion, hearing loss, sinus pain and tinnitus.   Eyes:  Negative for blurred vision and double vision.  Respiratory:  Negative for cough, shortness of breath and wheezing.   Cardiovascular:  Negative for chest pain, palpitations, orthopnea, claudication and leg swelling.  Gastrointestinal:  Negative for abdominal pain, blood in stool, constipation, diarrhea, heartburn, melena, nausea and vomiting.  Genitourinary: Negative.   Musculoskeletal:  Positive for back pain (lumbar) and joint pain. Negative for myalgias.  Skin:  Negative for rash.  Neurological:  Positive for tingling (feet and hands intermittent). Negative for dizziness, sensory change, weakness and headaches.  Endo/Heme/Allergies:  Negative for polydipsia.  Psychiatric/Behavioral:  Negative for depression, substance abuse and suicidal ideas. The patient is not nervous/anxious and does not have insomnia (improved, trazodone does help).   All other systems reviewed and are negative.  Physical Exam: BP 126/76   Pulse 94   Temp (!) 97.3 F (36.3 C)   Wt 253 lb 3.2 oz (114.9 kg)   SpO2 95%   BMI 36.33 kg/m  Wt Readings from Last 3 Encounters:  07/03/21 253 lb 3.2 oz (114.9 kg)  03/19/21 246 lb 9.6 oz (111.9 kg)  12/25/20 230 lb (104.3 kg)   General Appearance: Well nourished, in no apparent distress. Eyes: PERRLA, EOMs, conjunctiva no swelling or erythema Sinuses: No Frontal/maxillary tenderness ENT/Mouth: Ext aud canals clear, TMs without erythema, bulging. No erythema, swelling, or exudate on post pharynx.  Tonsils not swollen or erythematous. Hearing normal.  Neck: Supple, thyroid normal.  Respiratory: Respiratory effort normal, BS equal bilaterally without rales, rhonchi, wheezing or stridor.   Cardio: RRR with no MRGs. Brisk peripheral pulses with mild edema left leg more than right, no warmth, tenderness, erythema. Neg homen's.  Abdomen: Soft, + BS.  Non tender, no guarding, rebound, hernias, masses. Lymphatics: Non tender without lymphadenopathy.  Musculoskeletal: Full ROM, 5/5 strength, Normal gait Skin: Warm, dry without rashes, lesions, ecchymosis.  Neuro: Cranial nerves intact. No cerebellar symptoms.  Psych: Awake and oriented X 3, normal affect, Insight and Judgment appropriate.  Izora Ribas, NP 4:23 PM [facility]

## 2021-07-03 ENCOUNTER — Other Ambulatory Visit: Payer: Self-pay

## 2021-07-03 ENCOUNTER — Encounter: Payer: Self-pay | Admitting: Adult Health

## 2021-07-03 ENCOUNTER — Ambulatory Visit: Payer: BC Managed Care – PPO | Admitting: Adult Health

## 2021-07-03 VITALS — BP 126/76 | HR 94 | Temp 97.3°F | Wt 253.2 lb

## 2021-07-03 DIAGNOSIS — E1142 Type 2 diabetes mellitus with diabetic polyneuropathy: Secondary | ICD-10-CM | POA: Diagnosis not present

## 2021-07-03 DIAGNOSIS — E1169 Type 2 diabetes mellitus with other specified complication: Secondary | ICD-10-CM | POA: Diagnosis not present

## 2021-07-03 DIAGNOSIS — E349 Endocrine disorder, unspecified: Secondary | ICD-10-CM

## 2021-07-03 DIAGNOSIS — E782 Mixed hyperlipidemia: Secondary | ICD-10-CM

## 2021-07-03 DIAGNOSIS — E119 Type 2 diabetes mellitus without complications: Secondary | ICD-10-CM | POA: Diagnosis not present

## 2021-07-03 DIAGNOSIS — Z79899 Other long term (current) drug therapy: Secondary | ICD-10-CM

## 2021-07-03 DIAGNOSIS — Z23 Encounter for immunization: Secondary | ICD-10-CM

## 2021-07-03 DIAGNOSIS — M5136 Other intervertebral disc degeneration, lumbar region: Secondary | ICD-10-CM

## 2021-07-03 DIAGNOSIS — I10 Essential (primary) hypertension: Secondary | ICD-10-CM | POA: Diagnosis not present

## 2021-07-03 DIAGNOSIS — E559 Vitamin D deficiency, unspecified: Secondary | ICD-10-CM

## 2021-07-03 DIAGNOSIS — E785 Hyperlipidemia, unspecified: Secondary | ICD-10-CM

## 2021-07-03 DIAGNOSIS — M1000 Idiopathic gout, unspecified site: Secondary | ICD-10-CM

## 2021-07-03 DIAGNOSIS — E669 Obesity, unspecified: Secondary | ICD-10-CM

## 2021-07-03 NOTE — Patient Instructions (Addendum)
Ask insurance if they cover continuous glucose monitor (Freestyle libre, dexcom, etc)  Ask pharmacy if they carry: Trulicity - 3 mg, 4.5 mg pens, or mounjaro lower dose (12.5 mg dose pens, etc)   Think about places convenient for lunch - sit down and make a list of "good" diabetes friendly options that you enjoy, have several options for each  Try to keep easy / convenient options for quick food in home - turkey/bean chili, white bean chicken chili, etc  Can have frozen portions that heat up quickly  If getting a sandwhich, take 1/2 biscuit, muffin, bread, etc off  Try green beans, coleslaw as side  Ranch is ok for diabetes

## 2021-07-04 ENCOUNTER — Encounter: Payer: Self-pay | Admitting: Adult Health

## 2021-07-04 LAB — COMPLETE METABOLIC PANEL WITH GFR
AG Ratio: 1.9 (calc) (ref 1.0–2.5)
ALT: 35 U/L (ref 9–46)
AST: 30 U/L (ref 10–35)
Albumin: 4.6 g/dL (ref 3.6–5.1)
Alkaline phosphatase (APISO): 55 U/L (ref 35–144)
BUN: 21 mg/dL (ref 7–25)
CO2: 29 mmol/L (ref 20–32)
Calcium: 10.6 mg/dL — ABNORMAL HIGH (ref 8.6–10.3)
Chloride: 104 mmol/L (ref 98–110)
Creat: 0.94 mg/dL (ref 0.70–1.30)
Globulin: 2.4 g/dL (calc) (ref 1.9–3.7)
Glucose, Bld: 162 mg/dL — ABNORMAL HIGH (ref 65–99)
Potassium: 4.6 mmol/L (ref 3.5–5.3)
Sodium: 141 mmol/L (ref 135–146)
Total Bilirubin: 0.4 mg/dL (ref 0.2–1.2)
Total Protein: 7 g/dL (ref 6.1–8.1)
eGFR: 95 mL/min/{1.73_m2} (ref >=60)

## 2021-07-04 LAB — LIPID PANEL
Cholesterol: 120 mg/dL (ref <200)
HDL: 25 mg/dL — ABNORMAL LOW (ref >=40)
LDL Cholesterol (Calc): 64 mg/dL (calc)
Non-HDL Cholesterol (Calc): 95 mg/dL (calc) (ref <130)
Total CHOL/HDL Ratio: 4.8 (calc) (ref <5.0)
Triglycerides: 260 mg/dL — ABNORMAL HIGH (ref <150)

## 2021-07-04 LAB — CBC WITH DIFFERENTIAL/PLATELET
Absolute Monocytes: 410 cells/uL (ref 200–950)
Basophils Absolute: 38 cells/uL (ref 0–200)
Basophils Relative: 0.6 %
Eosinophils Absolute: 256 cells/uL (ref 15–500)
Eosinophils Relative: 4 %
HCT: 45.4 % (ref 38.5–50.0)
Hemoglobin: 15.4 g/dL (ref 13.2–17.1)
Lymphs Abs: 2720 cells/uL (ref 850–3900)
MCH: 28.2 pg (ref 27.0–33.0)
MCHC: 33.9 g/dL (ref 32.0–36.0)
MCV: 83.2 fL (ref 80.0–100.0)
MPV: 10.7 fL (ref 7.5–12.5)
Monocytes Relative: 6.4 %
Neutro Abs: 2976 cells/uL (ref 1500–7800)
Neutrophils Relative %: 46.5 %
Platelets: 219 10*3/uL (ref 140–400)
RBC: 5.46 10*6/uL (ref 4.20–5.80)
RDW: 13.3 % (ref 11.0–15.0)
Total Lymphocyte: 42.5 %
WBC: 6.4 10*3/uL (ref 3.8–10.8)

## 2021-07-04 LAB — TSH: TSH: 2.34 mIU/L (ref 0.40–4.50)

## 2021-07-04 LAB — MAGNESIUM: Magnesium: 1.7 mg/dL (ref 1.5–2.5)

## 2021-07-04 LAB — HEMOGLOBIN A1C
Hgb A1c MFr Bld: 8.3 % of total Hgb — ABNORMAL HIGH (ref <5.7)
Mean Plasma Glucose: 192 mg/dL
eAG (mmol/L): 10.6 mmol/L

## 2021-07-11 ENCOUNTER — Encounter: Payer: Self-pay | Admitting: Adult Health

## 2021-07-17 ENCOUNTER — Encounter: Payer: Self-pay | Admitting: Internal Medicine

## 2021-07-17 ENCOUNTER — Other Ambulatory Visit: Payer: Self-pay | Admitting: Internal Medicine

## 2021-07-17 MED ORDER — PREDNISONE 20 MG PO TABS: ORAL_TABLET | ORAL | 0 refills | Status: DC

## 2021-07-17 MED ORDER — BENZONATATE 200 MG PO CAPS: ORAL_CAPSULE | ORAL | 1 refills | Status: DC

## 2021-07-17 MED ORDER — AZITHROMYCIN 250 MG PO TABS: ORAL_TABLET | ORAL | 1 refills | Status: DC

## 2021-07-17 MED ORDER — NOVOLIN 70/30 (70-30) 100 UNIT/ML ~~LOC~~ SUSP: SUBCUTANEOUS | 99 refills | Status: DC

## 2021-08-06 ENCOUNTER — Encounter (HOSPITAL_BASED_OUTPATIENT_CLINIC_OR_DEPARTMENT_OTHER): Payer: Self-pay | Admitting: Emergency Medicine

## 2021-08-06 ENCOUNTER — Emergency Department (HOSPITAL_BASED_OUTPATIENT_CLINIC_OR_DEPARTMENT_OTHER)
Admission: EM | Admit: 2021-08-06 | Discharge: 2021-08-06 | Disposition: A | Discharge status: Home / Self Care | Payer: BC Managed Care – PPO | Attending: Emergency Medicine | Admitting: Emergency Medicine

## 2021-08-06 ENCOUNTER — Emergency Department (HOSPITAL_BASED_OUTPATIENT_CLINIC_OR_DEPARTMENT_OTHER): Payer: BC Managed Care – PPO

## 2021-08-06 ENCOUNTER — Other Ambulatory Visit: Payer: Self-pay

## 2021-08-06 DIAGNOSIS — J40 Bronchitis, not specified as acute or chronic: Secondary | ICD-10-CM | POA: Diagnosis not present

## 2021-08-06 DIAGNOSIS — Z7984 Long term (current) use of oral hypoglycemic drugs: Secondary | ICD-10-CM | POA: Diagnosis not present

## 2021-08-06 DIAGNOSIS — E119 Type 2 diabetes mellitus without complications: Secondary | ICD-10-CM | POA: Diagnosis not present

## 2021-08-06 DIAGNOSIS — Z7982 Long term (current) use of aspirin: Secondary | ICD-10-CM | POA: Diagnosis not present

## 2021-08-06 DIAGNOSIS — J45909 Unspecified asthma, uncomplicated: Secondary | ICD-10-CM | POA: Insufficient documentation

## 2021-08-06 DIAGNOSIS — R059 Cough, unspecified: Secondary | ICD-10-CM | POA: Diagnosis not present

## 2021-08-06 DIAGNOSIS — Z79899 Other long term (current) drug therapy: Secondary | ICD-10-CM | POA: Diagnosis not present

## 2021-08-06 DIAGNOSIS — Z20822 Contact with and (suspected) exposure to covid-19: Secondary | ICD-10-CM | POA: Diagnosis not present

## 2021-08-06 DIAGNOSIS — R0602 Shortness of breath: Secondary | ICD-10-CM | POA: Diagnosis not present

## 2021-08-06 DIAGNOSIS — R0682 Tachypnea, not elsewhere classified: Secondary | ICD-10-CM | POA: Diagnosis not present

## 2021-08-06 DIAGNOSIS — K760 Fatty (change of) liver, not elsewhere classified: Secondary | ICD-10-CM | POA: Diagnosis not present

## 2021-08-06 DIAGNOSIS — Z7951 Long term (current) use of inhaled steroids: Secondary | ICD-10-CM | POA: Insufficient documentation

## 2021-08-06 DIAGNOSIS — J22 Unspecified acute lower respiratory infection: Secondary | ICD-10-CM | POA: Insufficient documentation

## 2021-08-06 DIAGNOSIS — I10 Essential (primary) hypertension: Secondary | ICD-10-CM | POA: Insufficient documentation

## 2021-08-06 DIAGNOSIS — R000 Tachycardia, unspecified: Secondary | ICD-10-CM | POA: Insufficient documentation

## 2021-08-06 DIAGNOSIS — Z794 Long term (current) use of insulin: Secondary | ICD-10-CM | POA: Diagnosis not present

## 2021-08-06 DIAGNOSIS — R0600 Dyspnea, unspecified: Secondary | ICD-10-CM | POA: Diagnosis not present

## 2021-08-06 DIAGNOSIS — J9811 Atelectasis: Secondary | ICD-10-CM | POA: Diagnosis not present

## 2021-08-06 LAB — CBC WITH DIFFERENTIAL/PLATELET
Abs Immature Granulocytes: 0.02 10*3/uL (ref 0.00–0.07)
Basophils Absolute: 0 10*3/uL (ref 0.0–0.1)
Basophils Relative: 0 %
Eosinophils Absolute: 0.2 10*3/uL (ref 0.0–0.5)
Eosinophils Relative: 2 %
HCT: 42.1 % (ref 39.0–52.0)
Hemoglobin: 14.9 g/dL (ref 13.0–17.0)
Immature Granulocytes: 0 %
Lymphocytes Relative: 14 %
Lymphs Abs: 1.4 10*3/uL (ref 0.7–4.0)
MCH: 28 pg (ref 26.0–34.0)
MCHC: 35.4 g/dL (ref 30.0–36.0)
MCV: 79.1 fL — ABNORMAL LOW (ref 80.0–100.0)
Monocytes Absolute: 0.6 10*3/uL (ref 0.1–1.0)
Monocytes Relative: 6 %
Neutro Abs: 7.6 10*3/uL (ref 1.7–7.7)
Neutrophils Relative %: 78 %
Platelets: 173 10*3/uL (ref 150–400)
RBC: 5.32 MIL/uL (ref 4.22–5.81)
RDW: 12.8 % (ref 11.5–15.5)
WBC: 9.8 10*3/uL (ref 4.0–10.5)
nRBC: 0 % (ref 0.0–0.2)

## 2021-08-06 LAB — BASIC METABOLIC PANEL
Anion gap: 10 (ref 5–15)
BUN: 14 mg/dL (ref 6–20)
CO2: 21 mmol/L — ABNORMAL LOW (ref 22–32)
Calcium: 9 mg/dL (ref 8.9–10.3)
Chloride: 101 mmol/L (ref 98–111)
Creatinine, Ser: 0.79 mg/dL (ref 0.61–1.24)
GFR, Estimated: >60 mL/min (ref >60)
Glucose, Bld: 251 mg/dL — ABNORMAL HIGH (ref 70–99)
Potassium: 4 mmol/L (ref 3.5–5.1)
Sodium: 132 mmol/L — ABNORMAL LOW (ref 135–145)

## 2021-08-06 LAB — D-DIMER, QUANTITATIVE: D-Dimer, Quant: 0.67 ug/mL-FEU — ABNORMAL HIGH (ref 0.00–0.50)

## 2021-08-06 LAB — RESP PANEL BY RT-PCR (FLU A&B, COVID) ARPGX2
Influenza A by PCR: NEGATIVE
Influenza B by PCR: NEGATIVE
SARS Coronavirus 2 by RT PCR: NEGATIVE

## 2021-08-06 MED ORDER — ATENOLOL 25 MG PO TABS
100.0000 mg | ORAL_TABLET | Freq: Once | ORAL | Status: AC
Start: 2021-08-06 — End: 2021-08-06
  Administered 2021-08-06: 100 mg via ORAL
  Filled 2021-08-06: qty 4

## 2021-08-06 MED ORDER — PREDNISONE 20 MG PO TABS: 40.0000 mg | ORAL_TABLET | Freq: Every day | ORAL | 0 refills | Status: DC

## 2021-08-06 MED ORDER — HYDROCOD POLST-CPM POLST ER 10-8 MG/5ML PO SUER
5.0000 mL | Freq: Once | ORAL | Status: AC
  Administered 2021-08-06: 5 mL via ORAL
  Filled 2021-08-06: qty 5

## 2021-08-06 MED ORDER — SODIUM CHLORIDE 0.9 % IV BOLUS
1000.0000 mL | Freq: Once | INTRAVENOUS | Status: AC
  Administered 2021-08-06: 1000 mL via INTRAVENOUS

## 2021-08-06 MED ORDER — IOHEXOL 350 MG/ML SOLN
100.0000 mL | Freq: Once | INTRAVENOUS | Status: AC | PRN
  Administered 2021-08-06: 100 mL via INTRAVENOUS

## 2021-08-06 MED ORDER — ALBUTEROL SULFATE HFA 108 (90 BASE) MCG/ACT IN AERS
8.0000 | INHALATION_SPRAY | Freq: Once | RESPIRATORY_TRACT | Status: AC
  Administered 2021-08-06: 8 via RESPIRATORY_TRACT
  Filled 2021-08-06: qty 6.7

## 2021-08-06 MED ORDER — METHYLPREDNISOLONE SODIUM SUCC 125 MG IJ SOLR
125.0000 mg | Freq: Once | INTRAMUSCULAR | Status: AC
  Administered 2021-08-06: 125 mg via INTRAVENOUS
  Filled 2021-08-06: qty 2

## 2021-08-06 MED ORDER — DOXYCYCLINE HYCLATE 100 MG PO CAPS: 100.0000 mg | ORAL_CAPSULE | Freq: Two times a day (BID) | ORAL | 0 refills | Status: DC

## 2021-08-06 NOTE — ED Notes (Signed)
D/c paperwork reviewed with pt, including prescriptions and f/u care. Pt with no questions or concerns at time of d/c. Pt ambulatory to ED exit on RA, NAD.

## 2021-08-06 NOTE — ED Notes (Signed)
Patient transported to CT 

## 2021-08-06 NOTE — ED Notes (Addendum)
Pt ambulated from room 9 to the rt past the hall to the ambulance bay and to the front of room 1 and back to room 9. Pt O2 stats remained at 90% and dropped to 89 occasionally. Pt respirations was 16 to 18 per min. Pt denies sob or any pain at this time. Pt did not appear to be in any distress.

## 2021-08-06 NOTE — ED Provider Notes (Signed)
[facility] EMERGENCY DEPARTMENT Provider Note   CSN: [id number] Arrival date & time: 08/06/21  2019     History  Chief Complaint  Patient presents with   Shortness of Breath    Shaun [name] is a 56 y.o. male.  Patient with history of diabetes, hypertension, hyperlipidemia, asthma (per chart) --presents to the emergency department for respiratory infection.  Patient states that he developed flulike illness, suspected fevers several weeks ago.  He called his primary care doctor who called him and azithromycin and a steroid.  He took this and was actually improved.  He felt better and went to the beach about a week ago.  Upon returning, 2 days ago, he developed chest congestion.  This progressed to significant cough, shortness of breath, and subjective fever again.  He started taking another course of a azithromycin but has not improved.  He states that he felt like he could not get air in tonight, prompting emergency department visit.  No significant chest pain.  No lower extremity swelling or unilateral leg pain.  No history of blood clots.      Home Medications Prior to Admission medications   Medication Sig Start Date End Date Taking? Authorizing Provider  aspirin 81 MG tablet Take 81 mg by mouth daily.    [provider]  atenolol (TENORMIN) 100 MG tablet Take  1 tablet  Daily for BP  / Patient knows to take by mouth 02/10/21   Unk Pinto, MD  azithromycin (ZITHROMAX) 250 MG tablet Take 2 tablets with Food on  Day 1, then 1 tablet Daily with Food for Sinusitis / Bronchitis 07/17/21   Unk Pinto, MD  benzonatate (TESSALON) 200 MG capsule Take 1 perle 3 x / day to prevent cough 07/17/21   Unk Pinto, MD  Blood Glucose Monitoring Suppl (CONTOUR NEXT MONITOR) w/Device KIT 1 Device by Does not apply route 3 (three) times daily. 04/01/19   Unk Pinto, MD  Cholecalciferol (VITAMIN D) 2000 units CAPS Take 4 capsules by mouth daily.     [provider]  Cinnamon 500 MG capsule Take 500 mg by mouth 4 (four) times daily.    [provider]  Cyanocobalamin (B-12) 2500 MCG SUBL Place 1 tablet under the tongue daily.    [provider]  ezetimibe (ZETIA) 10 MG tablet Take 1 tablet Daily for Cholesterol  / Patient knows to take by mouth 03/31/21   Unk Pinto, MD  fenofibrate 160 MG tablet Take 1 tablet Daily for Triglycerides (Blood Fats) / Patient knows to take by mouth 02/10/21   Unk Pinto, MD  glipiZIDE (GLUCOTROL) 5 MG tablet Take 1 tablet 3 x /day with Meals for Diabetes / Patient knows to take by mouth 05/23/21   Magda Bernheim, NP  glucose blood (CONTOUR NEXT TEST) test strip Test blood sugar  three times a day before meals 04/05/19   Unk Pinto, MD  insulin NPH-regular Human (NOVOLIN 70/30) (70-30) 100 UNIT/ML injection Take 35 u - am   &   30 u - pm   or as directed 07/17/21   Unk Pinto, MD  ipratropium (ATROVENT) 0.06 % nasal spray Use 1 to 2 sprays each nostril 2 to 3 x /day as needed 09/28/19   Unk Pinto, MD  metFORMIN (GLUCOPHAGE-XR) 500 MG 24 hr tablet Take 2 tablets 2 x /Day with Food for Diabetes 01/04/21   Unk Pinto, MD  MICROLET LANCETS MISC USE TO TEST UP TO THREE TIMES DAILY AS  DIRECTED 10/15/17   Unk Pinto, MD  olmesartan (BENICAR) 40 MG tablet Take 1/2 to 1 tablet at night for BP 09/28/19   Unk Pinto, MD  predniSONE (DELTASONE) 20 MG tablet 1 tab 3 x day for 3 days, then 1 tab 2 x day for 3 days, then 1 tab 1 x day for 5 days 07/17/21   Unk Pinto, MD  pregabalin (LYRICA) 100 MG capsule Take  1 capsule  2 x /day  with Bkfst & Lunch  and 2 capsules  at Bedtime  for Painful Diabetic Neuropathy 03/19/21   Unk Pinto, MD  Semaglutide (OZEMPIC, 1 MG/DOSE, [place]) Inject into the skin.    [provider]  traZODone (DESYREL) 150 MG tablet 1/2-1 tablet at night as needed for sleep 01/16/21   Liane Comber, NP      Allergies    Phentermine,  Topamax [topiramate], Zocor [simvastatin], and Zoloft [sertraline hcl]    Review of Systems   Review of Systems  Constitutional:  Negative for fever (Subjective).  HENT:  Positive for congestion. Negative for rhinorrhea and sore throat.   Eyes:  Negative for redness.  Respiratory:  Positive for cough, chest tightness and shortness of breath. Negative for wheezing.   Cardiovascular:  Negative for chest pain and leg swelling.  Gastrointestinal:  Negative for abdominal pain, diarrhea, nausea and vomiting.  Genitourinary:  Negative for dysuria and hematuria.  Musculoskeletal:  Positive for myalgias.  Skin:  Negative for rash.  Neurological:  Negative for headaches.   Physical Exam Updated Vital Signs BP (!) 153/73    Pulse (!) 118    Temp 99 F (37.2 C) (Oral)    Resp (!) 28    Ht 5' 10.5" (1.791 m)    Wt 108.9 kg    SpO2 92%    BMI 33.95 kg/m  Physical Exam Vitals and nursing note reviewed.  Constitutional:      General: He is not in acute distress.    Appearance: He is well-developed.  HENT:     Head: Normocephalic and atraumatic.  Eyes:     General:        Right eye: No discharge.        Left eye: No discharge.     Conjunctiva/sclera: Conjunctivae normal.  Cardiovascular:     Rate and Rhythm: Regular rhythm. Tachycardia present.     Heart sounds: Normal heart sounds.  Pulmonary:     Effort: Pulmonary effort is normal. Tachypnea present.     Breath sounds: Decreased breath sounds and wheezing (Mild, generalized) present.  Abdominal:     Palpations: Abdomen is soft.     Tenderness: There is no abdominal tenderness.  Musculoskeletal:     Cervical back: Normal range of motion and neck supple.     Right lower leg: No tenderness. No edema.     Left lower leg: No tenderness. No edema.     Comments: No symptoms concerning for DVT  Skin:    General: Skin is warm and dry.  Neurological:     Mental Status: He is alert.    ED Results / Procedures / Treatments   Labs (all labs  ordered are listed, but only abnormal results are displayed) Labs Reviewed  CBC WITH DIFFERENTIAL/PLATELET - Abnormal; Notable for the following components:      Result Value   MCV 79.1 (*)    All other components within normal limits  BASIC METABOLIC PANEL - Abnormal; Notable for the following components:   Sodium 132 (*)  CO2 21 (*)    Glucose, Bld 251 (*)    All other components within normal limits  D-DIMER, QUANTITATIVE - Abnormal; Notable for the following components:   D-Dimer, Quant 0.67 (*)    All other components within normal limits  RESP PANEL BY RT-PCR (FLU A&B, COVID) [id number]    EKG EKG Interpretation  Date/Time:  Tuesday August 06 2021 20:41:08 EST Ventricular Rate:  111 PR Interval:  150 QRS Duration: 74 QT Interval:  314 QTC Calculation: 427 R Axis:   108 Text Interpretation: Sinus tachycardia Rightward axis Possible Anterior infarct , age undetermined Abnormal ECG When compared with ECG of 22-Apr-2001 18:32, PREVIOUS ECG IS PRESENT Confirmed by Lennice Sites [phone]) on 08/06/2021 8:51:45 PM  Radiology DG Chest Port 1 View  Result Date: 08/06/2021 CLINICAL DATA:  Cough, this dyspnea EXAM: PORTABLE CHEST 1 VIEW COMPARISON:  02/09/2013 FINDINGS: The heart size and mediastinal contours are within normal limits. Both lungs are clear. The visualized skeletal structures are unremarkable. IMPRESSION: No active disease. Electronically Signed   By: Fidela Salisbury M.D.   On: 08/06/2021 21:08    Procedures Procedures    Medications Ordered in ED Medications  albuterol (VENTOLIN HFA) 108 (90 Base) MCG/ACT inhaler 8 puff (8 puffs Inhalation Given 08/06/21 2111)  methylPREDNISolone sodium succinate (SOLU-MEDROL) 125 mg/2 mL injection 125 mg (125 mg Intravenous Given 08/06/21 2201)  chlorpheniramine-HYDROcodone (TUSSIONEX) 10-8 MG/5ML suspension 5 mL (5 mLs Oral Given 08/06/21 2204)  sodium chloride 0.9 % bolus 1,000 mL (0 mLs Intravenous Stopped 08/06/21 2334)  iohexol  (OMNIPAQUE) 350 MG/ML injection 100 mL (100 mLs Intravenous Contrast Given 08/06/21 2143)  atenolol (TENORMIN) tablet 100 mg (100 mg Oral Given 08/06/21 2301)    ED Course/ Medical Decision Making/ A&P    Patient seen and examined. Plan discussed with patient.   Labs: CBC, BMP, D-dimer and respiratory panel  Imaging: Chest x-ray  Medications/Fluids: Albuterol, Tessalon, Solu-Medrol  Vital signs reviewed and are as follows: BP (!) 153/73    Pulse (!) 118    Temp 99 F (37.2 C) (Oral)    Resp (!) 28    Ht 5' 10.5" (1.791 m)    Wt 108.9 kg    SpO2 92%    BMI 33.95 kg/m   Initial impression: Respiratory infection, rule out PE.    Reassessment performed. Patient appears somewhat improved as far as work of breathing.   Reviewed pertinent lab work with patient at bedside including elevated D-dimer.  CT of the chest was ordered to evaluate for blood clot and occult pneumonia and was normal.  Awaiting COVID and flu testing.  Plan: We will give home atenolol given persistent elevated heart rate.  Patient has not taken his meds or insulin today due to feeling sick.  Will reassess.  Reviewed case with Dr. Ronnald Nian.  Heart rate currently is generally low 100s.   11:33 PM On re-exam patient appears a lot more comfortable.  Discussed pertinent results with patient at bedside. This included negative COVID and flu testing.  Patient ambulated with oxygen saturation around 90%.  Offered hour-long continuous nebulizer treatment and reassessment versus discharge home now.  Patient and wife are comfortable with discharge home at this time as he is feeling better.  During my reassessment, no increased work of breathing.  Oxygen level 93%.  He looks better than when he arrived.  Home treatment: Prescription written for doxycycline, prednisone.  Patient has the inhaler provided here and is encouraged to use it every 4 hours.  He has home pulse ox and will continue to monitor his oxygen level.  Discussed that  if his oxygen level persistently drops into the 80s, he needs to return to the emergency department for reevaluation.  He seems reliable to do this and is agreeable.  We also discussed importance of monitoring blood sugars closely and using sliding scale to address hyperglycemia which is likely with use of prednisone.  States that he is able to do this.  Follow-up: Encouraged patient to follow-up with their provider for recheck in 2 days.  Encouraged ED return if he has persistently low pulse ox, worsening shortness of breath or trouble breathing, chest pain, persistent fever, or other concerns.                          Medical Decision Making  Patient with recent respiratory illness, improvement with initial treatment, and then worsening.  No significant underlying pulmonary disease.  Does have diabetes.  Presents to the emergency department tonight with tachycardia, oxygen saturations 90% - 95% on room air.  He was evaluated for pneumonia.  Chest x-ray and CT negative.  Due to elevated D-dimer and tachycardia, CTA was ordered which did not demonstrate PE.  Abnormal but stable and nonischemic EKG.  Do not suspect ACS as patient is not having any significant chest pain except lower chest tightness with coughing.  Do not suspect dissection.  Patient treated in the emergency department with IV steroids, albuterol.  Clinically he is improved.  Still with borderline O2 saturations but not requiring oxygen.  Given clinical provement, patient preference to try to treat this as an outpatient, will discharged home at this time with return instructions as above.  Patient seems reliable to return.          Final Clinical Impression(s) / ED Diagnoses Final diagnoses:  Bronchitis  Shortness of breath    Rx / DC Orders ED Discharge Orders          Ordered    predniSONE (DELTASONE) 20 MG tablet  Daily        08/06/21 2323    doxycycline (VIBRAMYCIN) 100 MG capsule  2 times daily        08/06/21 2323               Carlisle Cater, PA-C 08/06/21 2339    Lennice Sites, DO 08/07/21 [phone]

## 2021-08-06 NOTE — Discharge Instructions (Signed)
Please read and follow all provided instructions.  Your diagnoses today include:  1. Bronchitis   2. Shortness of breath     Tests performed today include: Complete blood cell count: Normal white blood cell count Basic metabolic panel:  Chest x-ray: No signs of pneumonia D-dimer: Screening test for blood clot that was positive Chest CT: No evidence of pulmonary embolism or significant pneumonia EKG COVID/flu testing: negative  Vital signs. See below for your results today.   Medications prescribed:  Albuterol inhaler - medication that opens up your airway  Use inhaler as follows: 1-2 puffs with spacer every 4 hours as needed for wheezing, cough, or shortness of breath.   Prednisone - steroid medicine   It is best to take this medication in the morning to prevent sleeping problems. If you are diabetic, monitor your blood sugar closely and stop taking Prednisone if blood sugar is over 300. Take with food to prevent stomach upset.   Doxycycline - antibiotic  You have been prescribed an antibiotic medicine: take the entire course of medicine even if you are feeling better. Stopping early can cause the antibiotic not to work.  Take any prescribed medications only as directed.  Home care instructions:  Follow any educational materials contained in this packet.  BE VERY CAREFUL not to take multiple medicines containing Tylenol (also called acetaminophen). Doing so can lead to an overdose which can damage your liver and cause liver failure and possibly death.   Follow-up instructions: Please follow-up with your primary care provider in the next 2 days for further evaluation of your symptoms.   Return instructions:  Please return to the Emergency Department if you experience worsening symptoms.  Return with worsening shortness of breath, trouble breathing, chest pain, fevers that are not improved with over-the-counter meds. Please return if you have any other emergent  concerns.  Additional Information:  Your vital signs today were: BP 139/78    Pulse (!) 109    Temp 99 F (37.2 C) (Oral)    Resp (!) 24    Ht 5' 10.5" (1.791 m)    Wt 108.9 kg    SpO2 91%    BMI 33.95 kg/m  If your blood pressure (BP) was elevated above 135/85 this visit, please have this repeated by your doctor within one month. --------------

## 2021-08-06 NOTE — ED Triage Notes (Signed)
Pt c/o SHOB since Sunday; recent URI treated with abx, but was feeling better until Sunday

## 2021-08-22 ENCOUNTER — Encounter: Payer: Self-pay | Admitting: Internal Medicine

## 2021-08-22 ENCOUNTER — Other Ambulatory Visit: Payer: Self-pay | Admitting: Internal Medicine

## 2021-08-22 DIAGNOSIS — I10 Essential (primary) hypertension: Secondary | ICD-10-CM

## 2021-08-22 DIAGNOSIS — E1165 Type 2 diabetes mellitus with hyperglycemia: Secondary | ICD-10-CM

## 2021-08-22 MED ORDER — GLIPIZIDE 5 MG PO TABS: ORAL_TABLET | ORAL | 3 refills | Status: DC

## 2021-08-22 MED ORDER — OLMESARTAN MEDOXOMIL 40 MG PO TABS: ORAL_TABLET | ORAL | 3 refills | Status: DC

## 2021-08-29 ENCOUNTER — Other Ambulatory Visit: Payer: Self-pay | Admitting: Internal Medicine

## 2021-08-29 ENCOUNTER — Encounter: Payer: Self-pay | Admitting: Internal Medicine

## 2021-08-29 MED ORDER — MOUNJARO 10 MG/0.5ML ~~LOC~~ SOAJ
10.0000 mg | SUBCUTANEOUS | 0 refills | Status: DC
  Filled 2021-08-29 – 2021-09-03 (×2): qty 2, 28d supply, fill #0

## 2021-08-30 ENCOUNTER — Other Ambulatory Visit (HOSPITAL_COMMUNITY): Payer: Self-pay

## 2021-09-02 ENCOUNTER — Other Ambulatory Visit (HOSPITAL_COMMUNITY): Payer: Self-pay

## 2021-09-03 ENCOUNTER — Other Ambulatory Visit (HOSPITAL_COMMUNITY): Payer: Self-pay

## 2021-09-05 ENCOUNTER — Other Ambulatory Visit (HOSPITAL_COMMUNITY): Payer: Self-pay

## 2021-09-05 ENCOUNTER — Encounter: Payer: BC Managed Care – PPO | Admitting: Internal Medicine

## 2021-09-10 ENCOUNTER — Other Ambulatory Visit: Payer: Self-pay | Admitting: Internal Medicine

## 2021-09-10 ENCOUNTER — Other Ambulatory Visit (HOSPITAL_COMMUNITY): Payer: Self-pay

## 2021-09-10 MED ORDER — TRULICITY 4.5 MG/0.5ML ~~LOC~~ SOAJ: 4.5000 mg | SUBCUTANEOUS | 0 refills | Status: DC

## 2021-09-11 ENCOUNTER — Other Ambulatory Visit (HOSPITAL_COMMUNITY): Payer: Self-pay

## 2021-09-19 ENCOUNTER — Other Ambulatory Visit (HOSPITAL_COMMUNITY): Payer: Self-pay

## 2021-09-19 ENCOUNTER — Other Ambulatory Visit: Payer: Self-pay | Admitting: Internal Medicine

## 2021-09-19 MED ORDER — TRULICITY 4.5 MG/0.5ML ~~LOC~~ SOAJ
4.5000 mg | SUBCUTANEOUS | 0 refills | Status: DC
  Filled 2021-09-19: qty 6, 84d supply, fill #0

## 2021-10-24 ENCOUNTER — Encounter: Payer: Self-pay | Admitting: Internal Medicine

## 2021-10-24 NOTE — Patient Instructions (Signed)
Due to recent changes in healthcare laws, you may see the results of your imaging and laboratory studies on MyChart before your provider has had a chance to review them.  We understand that in some cases there may be results that are confusing or concerning to you. Not all laboratory results come back in the same time frame and the provider may be waiting for multiple results in order to interpret others.  Please give us 48 hours in order for your provider to thoroughly review all the results before contacting the office for clarification of your results.  ? ?++++++++++++++++++++++++++++++++++++++ ? Vit D  & ?Vit C 1,000 mg   ?are recommended to help protect  ?against the Covid-19 and other Corona viruses.  ? ? Also it's recommended  ?to take  ?Zinc 50 mg  ?to help  ?protect against the Covid-19   ?and best place to get ? is also on [employer]  ?and don't pay more than 6-8 cents /pill !  ?=============================== ?Coronavirus (COVID-19) Are you at risk? ? ?Are you at risk for the Coronavirus (COVID-19)? ? ?To be considered HIGH RISK for Coronavirus (COVID-19), you have to meet the following criteria: ? ?Traveled to [place], [place], [place], [place] or [place]; or in the [place] to [place], [place], [place]  ?or [place]; and have fever, cough, and shortness of breath within the last 2 weeks of travel OR ?Been in close contact with a person diagnosed with COVID-19 within the last 2 weeks and have  ?fever, cough,and shortness of breath ? ?IF YOU DO NOT MEET THESE CRITERIA, YOU ARE CONSIDERED LOW RISK FOR COVID-19. ? ?What to do if you are HIGH RISK for COVID-19? ? ?If you are having a medical emergency, call 911. ?Seek medical care right away. Before you go to a doctor?s office, urgent care or emergency department, ? call ahead and tell them about your recent travel, contact with someone diagnosed with COVID-19  ? and your symptoms.  ?You should receive instructions from your physician?s office  regarding next steps of care.  ?When you arrive at healthcare provider, tell the healthcare staff immediately you have returned from  ?visiting [place], [place], [place], [place] or [place]; or traveled in the [place] to [place], [place],  ?[place] or [place] in the last two weeks or you have been in close contact with a person diagnosed with  ?COVID-19 in the last 2 weeks.   ?Tell the health care staff about your symptoms: fever, cough and shortness of breath. ?After you have been seen by a medical provider, you will be either: ?Tested for (COVID-19) and discharged home on quarantine except to seek medical care if  ?symptoms worsen, and asked to  ?Stay home and avoid contact with others until you get your results (4-5 days)  ?Avoid travel on public transportation if possible (such as bus, train, or airplane) or ?Sent to the Emergency Department by EMS for evaluation, COVID-19 testing  and  ?possible admission depending on your condition and test results. ? ?What to do if you are LOW RISK for COVID-19? ? ?Reduce your risk of any infection by using the same precautions used for avoiding the common cold or flu:  ?Wash your hands often with soap and warm water for at least 20 seconds.  If soap and water are not readily available,  ?use an alcohol-based hand sanitizer with at least 60% alcohol.  ?If coughing or sneezing, cover your mouth and nose by coughing   or sneezing into the elbow areas of your shirt or coat, ? into a tissue or into your sleeve (not your hands). ?Avoid shaking hands with others and consider head nods or verbal greetings only. ?Avoid touching your eyes, nose, or mouth with unwashed hands.  ?Avoid close contact with people who are sick. ?Avoid places or events with large numbers of people in one location, like concerts or sporting events. ?Carefully consider travel plans you have or are making. ?If you are planning any travel outside or inside the [place], visit the CDC?s Travelers? Health  webpage for the latest health notices. ?If you have some symptoms but not all symptoms, continue to monitor at home and seek medical attention  ?if your symptoms worsen. ?If you are having a medical emergency, call 911. ?>>>>>>>>>>>>>>>>>>>>>>>>>>>> ?Preventive Care for Adults ? ?A healthy lifestyle and preventive care can promote health and wellness. Preventive health guidelines for men include the following key practices: ?A routine yearly physical is a good way to check with your health care provider about your health and preventative screening. It is a chance to share any concerns and updates on your health and to receive a thorough exam. ?Visit your dentist for a routine exam and preventative care every 6 months. Brush your teeth twice a day and floss once a day. Good oral hygiene prevents tooth decay and gum disease. ?The frequency of eye exams is based on your age, health, family medical history, use of contact lenses, and other factors. Follow your health care provider's recommendations for frequency of eye exams. ?Eat a healthy diet. Foods such as vegetables, fruits, whole grains, low-fat dairy products, and lean protein foods contain the nutrients you need without too many calories. Decrease your intake of foods high in solid fats, added sugars, and salt. Eat the right amount of calories for you. Get information about a proper diet from your health care provider, if necessary. ?Regular physical exercise is one of the most important things you can do for your health. Most adults should get at least 150 minutes of moderate-intensity exercise (any activity that increases your heart rate and causes you to sweat) each week. In addition, most adults need muscle-strengthening exercises on 2 or more days a week. ?Maintain a healthy weight. The body mass index (BMI) is a screening tool to identify possible weight problems. It provides an estimate of body fat based on height and weight. Your health care provider can  find your BMI and can help you achieve or maintain a healthy weight. For adults 20 years and older: ?A BMI below 18.5 is considered underweight. ?A BMI of 18.5 to 24.9 is normal. ?A BMI of 25 to 29.9 is considered overweight. ?A BMI of 30 and above is considered obese. ?Maintain normal blood lipids and cholesterol levels by exercising and minimizing your intake of saturated fat. Eat a balanced diet with plenty of fruit and vegetables. Blood tests for lipids and cholesterol should begin at age 20 and be repeated every 5 years. If your lipid or cholesterol levels are high, you are over 50, or you are at high risk for heart disease, you may need your cholesterol levels checked more frequently. Ongoing high lipid and cholesterol levels should be treated with medicines if diet and exercise are not working. ?If you smoke, find out from your health care provider how to quit. If you do not use tobacco, do not start. ?Lung cancer screening is recommended for adults aged 55-80 years who are at high risk for   developing lung cancer because of a history of smoking. A yearly low-dose CT scan of the lungs is recommended for people who have at least a 30-pack-year history of smoking and are a current smoker or have quit within the past 15 years. A pack year of smoking is smoking an average of 1 pack of cigarettes a day for 1 year (for example: 1 pack a day for 30 years or 2 packs a day for 15 years). Yearly screening should continue until the smoker has stopped smoking for at least 15 years. Yearly screening should be stopped for people who develop a health problem that would prevent them from having lung cancer treatment. ?If you choose to drink alcohol, do not have more than 2 drinks per day. One drink is considered to be 12 ounces (355 mL) of beer, 5 ounces (148 mL) of wine, or 1.5 ounces (44 mL) of liquor. ?Avoid use of street drugs. Do not share needles with anyone. Ask for help if you need support or instructions about  stopping the use of drugs. ?High blood pressure causes heart disease and increases the risk of stroke. Your blood pressure should be checked at least every 1-2 years. Ongoing high blood pressure should be treated

## 2021-10-24 NOTE — Progress Notes (Signed)
? ?Annual  Screening/Preventative Visit  ?& Comprehensive Evaluation & Examination ? ?Future Appointments  ?Date Time Provider Department  ?10/25/2021 11:00 AM [name], MD [facility]  ?10/31/2022 11:00 AM [name], MD [facility]  ? ?    ?     This very nice 56 y.o. [name] presents for a Screening /Preventative Visit & comprehensive evaluation and management of multiple medical co-morbidities.  Patient has been followed for HTN, HLD, T2_IDDM  and Vitamin D Deficiency.   Patient has prior hx/o Gout and stable off meds. ? ? ?    HTN predates since 2004.   Patient's BP has been controlled at home.  Today's BP is slightly elevated at 140/100.   Patient denies any cardiac symptoms as chest pain, palpitations, shortness of breath, dizziness or ankle swelling. ? ? ?    Patient's hyperlipidemia is controlled with diet and Ezetimibe /Fenofibrate. Patient denies myalgias or other medication SE's. Last lipids were at goal except elevated Trig's : ? ?Lab Results  ?Component Value Date  ? CHOL 120 07/03/2021  ? HDL 25 (L) 07/03/2021  ? LDLCALC 64 07/03/2021  ? TRIG 260 (H) 07/03/2021  ? CHOLHDL 4.8 07/03/2021  ? ? ? ?    Patient has hx/o moderate Obesity (BMI 31+) and insulin requiring  T2_IDDM  (2004) w/CKD2  (GFR 87) .   In 2020 , patient was started on Insulin.  In Nov 2020 , he was started on Ozempic  ( wt 250#) Patient denies reactive hypoglycemic symptoms, visual blurring, diabetic polys or paresthesias. Last A1c was not at goal : ?  ?Lab Results  ?Component Value Date  ? HGBA1C 8.3 (H) 07/03/2021  ?  ? ? ?    Finally, patient has history of Vitamin D Deficiency ("15" /2009) and last vitamin D was at goal : ?  ?Lab Results  ?Component Value Date  ? VD25OH 70 03/19/2021  ? ? ? ?Current Outpatient Medications on File Prior to Visit  ?Medication Sig  ? aspirin 81 MG tablet Take  daily.  ? atenolol  100 MG tablet Take  1 tablet  Daily   ? VITAMIN D 2000 u Take 4 capsulesdaily.   ? Cinnamon 500 MG capsule Take  4   times daily.  ? Vitamin B-12   2500 MCG SL Place 1 tablet under the tongue daily.  ? Dulaglutide (TRULICITY) 4.5 MG/0.5ML  Inject 4.5 mg as directed once a week.  ? Ezetimibe  10 MG tablet Take 1 tablet Daily   ? fenofibrate 160 MG tablet Take 1 tablet Daily   ? glipiZIDE 5 MG tablet Take  1 tablet  3 x /day  with Meals   ? NOVOLIN 70/30 Take 35 u - am   &   30 u - pm   or as directed  ? ipratropium 0.06 % nasal spray Use 1 to 2 sprays each nostril 2 to 3 x /day as needed  ? metFORMIN XR 500 MG  Take 2 tablets 2 x /Day with Food   ? olmesartan 40 MG tablet Take 1/2 to 1 tablet  at night for BP  ? pregabalin  100 MG capsule Take  1 capsule  2 x /day  with Bkfst & Lunch  and 2 capsules  at Bedtime    ? traZODone (150 MG tablet 1/2-1 tablet at night as needed   ? ? ? ? ?Allergies  ?Allergen Reactions  ? Phentermine Other (See Comments)  ?  Vision loss  ? Topamax [Topiramate]  Other (See Comments)  ?  Vision changes/loss of vision  ? Zocor [Simvastatin]   ?  Headache  ? Zoloft [Sertraline Hcl]   ?  dysphona  ? ? ? ?Past Medical History:  ?Diagnosis Date  ? Asthma   ? Diabetes mellitus without complication (HCC)   ? Fatty liver disease, nonalcoholic   ? GERD (gastroesophageal reflux disease)   ? Gout   ? Hyperlipidemia   ? Hypertension   ? Hypogonadism male   ? Obesity   ? Venous insufficiency   ? ? ? ?Health Maintenance  ?Topic Date Due  ? COVID-19 Vaccine (1) Never done  ? Zoster Vaccines- Shingrix (1 of 2) Never done  ? FOOT EXAM  08/26/2021  ? HEMOGLOBIN A1C  12/31/2021  ? OPHTHALMOLOGY EXAM  01/02/2022  ? TETANUS/TDAP  02/10/2023  ? Fecal DNA (Cologuard)  09/20/2023  ? INFLUENZA VACCINE  Completed  ? Hepatitis C Screening  Completed  ? HIV Screening  Completed  ? HPV VACCINES  Aged Out  ? ? ? ?Immunization History  ?Administered Date(s) Administered  ? Influenza,inj,Quad PF,6+ Mos 07/03/2021  ? PPD Test 02/17/2014, 03/08/2015, 03/27/2016, 05/13/2017, 06/21/2018, 07/27/2019, 08/27/2020  ? Pneumococcal -23 07/27/2019   ? Pneumococcal -23 02/09/2013  ? Tdap 02/09/2013  ? ? ?Last Colon - 08/08/1995 - Dr Claretha CooperMagood - Negative ? ?Cologard - 09/19/2020 - Negative - Recc 3 year f/u due Feb 2025 ? ?Past Surgical History:  ?Procedure Laterality Date  ? ANTERIOR CRUCIATE LIGAMENT REPAIR Left 2001  ? CYSTOSCOPY  1993  ? ESOPHAGOGASTRODUODENOSCOPY  1995, 2002  ? gastritis, esophagititis  ? KNEE ARTHROSCOPY Right 2002, 1995  ? ? ? ?Family History  ?Problem Relation Age of Onset  ? Diabetes Mother   ? Hypertension Mother   ? Gout Mother   ? COPD Father   ? Cancer Sister [id number]  ?     appendix  ? Gout Brother   ? Hyperlipidemia Brother   ? Hypertension Brother   ? Hypertension Brother   ? Hyperlipidemia Brother   ? Gout Brother   ? Diabetes Brother   ? COPD Sister   ? ? ? ?Social History  ? ?Tobacco Use  ? Smoking status: Former  ?  Packs/day: 1.00  ?  Years: 15.00  ?  Pack years: 15.00  ?  Types: Cigarettes  ?  Quit date: 08/07/2000  ?  Years since quitting: 21.2  ? Smokeless tobacco: Never  ?Substance Use Topics  ? Alcohol use: Yes  ?  Comment: social  ? Drug use: No  ? ? ? ? ROS ?Constitutional: Denies fever, chills, weight loss/gain, headaches, insomnia,  night sweats or change in appetite. Does c/o fatigue. ?Eyes: Denies redness, blurred vision, diplopia, discharge, itchy or watery eyes.  ?ENT: Denies discharge, congestion, post nasal drip, epistaxis, sore throat, earache, hearing loss, dental pain, Tinnitus, Vertigo, Sinus pain or snoring.  ?Cardio: Denies chest pain, palpitations, irregular heartbeat, syncope, dyspnea, diaphoresis, orthopnea, PND, claudication or edema ?Respiratory: denies cough, dyspnea, DOE, pleurisy, hoarseness, laryngitis or wheezing.  ?Gastrointestinal: Denies dysphagia, heartburn, reflux, water brash, pain, cramps, nausea, vomiting, bloating, diarrhea, constipation, hematemesis, melena, hematochezia, jaundice or hemorrhoids ?Genitourinary: Denies dysuria, frequency, urgency, nocturia, hesitancy, discharge, hematuria or  flank pain ?Musculoskeletal: Denies arthralgia, myalgia, stiffness, Jt. Swelling, pain, limp or strain/sprain. Denies Falls. ?Skin: Denies puritis, rash, hives, warts, acne, eczema or change in skin lesion ?Neuro: No weakness, tremor, incoordination, spasms, paresthesia or pain ?Psychiatric: Denies confusion, memory loss or sensory loss. Denies Depression. ?Endocrine:  Denies change in weight, skin, hair change, nocturia, and paresthesia, diabetic polys, visual blurring or hyper / hypo glycemic episodes.  ?Heme/Lymph: No excessive bleeding, bruising or enlarged lymph nodes. ? ? ?Physical Exam ? ?BP (!) 140/100   Pulse 80   Temp (!) 97.3 ?F (36.3 ?C)   Ht 5' 10.25" (1.784 m)   Wt 241 lb 12.8 oz (109.7 kg)   SpO2 99%   BMI 34.45 kg/m?  ? ?General Appearance: Overnourished and well groomed and in no apparent distress. ? ?Eyes: PERRLA, EOMs, conjunctiva no swelling or erythema, normal fundi and vessels. ?Sinuses: No frontal/maxillary tenderness ?ENT/Mouth: EACs patent / TMs  nl. Nares clear without erythema, swelling, mucoid exudates. Oral hygiene is good. No erythema, swelling, or exudate. Tongue normal, non-obstructing. Tonsils not swollen or erythematous. Hearing normal.  ?Neck: Supple, thyroid not palpable. No bruits, nodes or JVD. ?Respiratory: Respiratory effort normal.  BS equal and clear bilateral without rales, rhonci, wheezing or stridor. ?Cardio: Heart sounds are normal with regular rate and rhythm and no murmurs, rubs or gallops. Peripheral pulses are normal and equal bilaterally without edema. No aortic or femoral bruits. ?Chest: symmetric with normal excursions and percussion.  ?Abdomen: Soft, with Nl bowel sounds. Nontender, no guarding, rebound, hernias, masses, or organomegaly.  ?Lymphatics: Non tender without lymphadenopathy.  ?Musculoskeletal: Full ROM all peripheral extremities, joint stability, 5/5 strength, and normal gait. ?Skin: Warm and dry without rashes, lesions, cyanosis, clubbing or   ecchymosis.  ?Neuro: Cranial nerves intact, reflexes equal bilaterally. Normal muscle tone, no cerebellar symptoms. Sensation intact.  ?Pysch: Alert and oriented X 3 with normal affect, insight and judgment appropr

## 2021-10-25 ENCOUNTER — Encounter: Payer: Self-pay | Admitting: Internal Medicine

## 2021-10-25 ENCOUNTER — Other Ambulatory Visit: Payer: Self-pay

## 2021-10-25 ENCOUNTER — Ambulatory Visit: Payer: BC Managed Care – PPO | Admitting: Internal Medicine

## 2021-10-25 VITALS — BP 140/100 | HR 80 | Temp 97.3°F | Ht 70.25 in | Wt 241.8 lb

## 2021-10-25 DIAGNOSIS — Z87891 Personal history of nicotine dependence: Secondary | ICD-10-CM

## 2021-10-25 DIAGNOSIS — Z79899 Other long term (current) drug therapy: Secondary | ICD-10-CM

## 2021-10-25 DIAGNOSIS — Z130 Encounter for screening for diseases of the blood and blood-forming organs and certain disorders involving the immune mechanism: Secondary | ICD-10-CM | POA: Diagnosis not present

## 2021-10-25 DIAGNOSIS — M109 Gout, unspecified: Secondary | ICD-10-CM | POA: Diagnosis not present

## 2021-10-25 DIAGNOSIS — Z111 Encounter for screening for respiratory tuberculosis: Secondary | ICD-10-CM

## 2021-10-25 DIAGNOSIS — Z0000 Encounter for general adult medical examination without abnormal findings: Secondary | ICD-10-CM | POA: Diagnosis not present

## 2021-10-25 DIAGNOSIS — E1142 Type 2 diabetes mellitus with diabetic polyneuropathy: Secondary | ICD-10-CM

## 2021-10-25 DIAGNOSIS — Z1329 Encounter for screening for other suspected endocrine disorder: Secondary | ICD-10-CM | POA: Diagnosis not present

## 2021-10-25 DIAGNOSIS — I10 Essential (primary) hypertension: Secondary | ICD-10-CM | POA: Diagnosis not present

## 2021-10-25 DIAGNOSIS — M1000 Idiopathic gout, unspecified site: Secondary | ICD-10-CM

## 2021-10-25 DIAGNOSIS — Z8249 Family history of ischemic heart disease and other diseases of the circulatory system: Secondary | ICD-10-CM | POA: Diagnosis not present

## 2021-10-25 DIAGNOSIS — E1122 Type 2 diabetes mellitus with diabetic chronic kidney disease: Secondary | ICD-10-CM

## 2021-10-25 DIAGNOSIS — E538 Deficiency of other specified B group vitamins: Secondary | ICD-10-CM

## 2021-10-25 DIAGNOSIS — Z13220 Encounter for screening for lipoid disorders: Secondary | ICD-10-CM | POA: Diagnosis not present

## 2021-10-25 DIAGNOSIS — Z1389 Encounter for screening for other disorder: Secondary | ICD-10-CM

## 2021-10-25 DIAGNOSIS — R5383 Other fatigue: Secondary | ICD-10-CM

## 2021-10-25 DIAGNOSIS — Z125 Encounter for screening for malignant neoplasm of prostate: Secondary | ICD-10-CM

## 2021-10-25 DIAGNOSIS — I700 Atherosclerosis of aorta: Secondary | ICD-10-CM | POA: Diagnosis not present

## 2021-10-25 DIAGNOSIS — E349 Endocrine disorder, unspecified: Secondary | ICD-10-CM

## 2021-10-25 DIAGNOSIS — E559 Vitamin D deficiency, unspecified: Secondary | ICD-10-CM

## 2021-10-25 DIAGNOSIS — Z136 Encounter for screening for cardiovascular disorders: Secondary | ICD-10-CM | POA: Diagnosis not present

## 2021-10-25 DIAGNOSIS — E1169 Type 2 diabetes mellitus with other specified complication: Secondary | ICD-10-CM

## 2021-10-25 DIAGNOSIS — Z0001 Encounter for general adult medical examination with abnormal findings: Secondary | ICD-10-CM

## 2021-10-25 DIAGNOSIS — Z131 Encounter for screening for diabetes mellitus: Secondary | ICD-10-CM

## 2021-10-25 DIAGNOSIS — Z794 Long term (current) use of insulin: Secondary | ICD-10-CM

## 2021-10-25 DIAGNOSIS — Z1211 Encounter for screening for malignant neoplasm of colon: Secondary | ICD-10-CM

## 2021-10-25 MED ORDER — TADALAFIL 20 MG PO TABS: ORAL_TABLET | ORAL | 1 refills | Status: DC

## 2021-10-26 LAB — URINE CULTURE
MICRO NUMBER:: 13177316
Result:: NO GROWTH
SPECIMEN QUALITY:: ADEQUATE

## 2021-10-26 LAB — CBC WITH DIFFERENTIAL/PLATELET
Absolute Monocytes: 386 cells/uL (ref 200–950)
Basophils Absolute: 21 cells/uL (ref 0–200)
Basophils Relative: 0.3 %
Eosinophils Absolute: 311 cells/uL (ref 15–500)
Eosinophils Relative: 4.5 %
HCT: 45.2 % (ref 38.5–50.0)
Hemoglobin: 15.2 g/dL (ref 13.2–17.1)
Lymphs Abs: 2974 cells/uL (ref 850–3900)
MCH: 28.5 pg (ref 27.0–33.0)
MCHC: 33.6 g/dL (ref 32.0–36.0)
MCV: 84.8 fL (ref 80.0–100.0)
MPV: 10.4 fL (ref 7.5–12.5)
Monocytes Relative: 5.6 %
Neutro Abs: 3209 cells/uL (ref 1500–7800)
Neutrophils Relative %: 46.5 %
Platelets: 234 10*3/uL (ref 140–400)
RBC: 5.33 10*6/uL (ref 4.20–5.80)
RDW: 13.8 % (ref 11.0–15.0)
Total Lymphocyte: 43.1 %
WBC: 6.9 10*3/uL (ref 3.8–10.8)

## 2021-10-26 LAB — COMPLETE METABOLIC PANEL WITH GFR
AG Ratio: 2 (calc) (ref 1.0–2.5)
ALT: 28 U/L (ref 9–46)
AST: 23 U/L (ref 10–35)
Albumin: 4.5 g/dL (ref 3.6–5.1)
Alkaline phosphatase (APISO): 42 U/L (ref 35–144)
BUN: 20 mg/dL (ref 7–25)
CO2: 28 mmol/L (ref 20–32)
Calcium: 10.2 mg/dL (ref 8.6–10.3)
Chloride: 104 mmol/L (ref 98–110)
Creat: 0.71 mg/dL (ref 0.70–1.30)
Globulin: 2.3 g/dL (calc) (ref 1.9–3.7)
Glucose, Bld: 110 mg/dL — ABNORMAL HIGH (ref 65–99)
Potassium: 4.8 mmol/L (ref 3.5–5.3)
Sodium: 139 mmol/L (ref 135–146)
Total Bilirubin: 0.6 mg/dL (ref 0.2–1.2)
Total Protein: 6.8 g/dL (ref 6.1–8.1)
eGFR: 108 mL/min/{1.73_m2} (ref >=60)

## 2021-10-26 LAB — LIPID PANEL
Cholesterol: 95 mg/dL (ref <200)
HDL: 27 mg/dL — ABNORMAL LOW (ref >=40)
LDL Cholesterol (Calc): 48 mg/dL (calc)
Non-HDL Cholesterol (Calc): 68 mg/dL (calc) (ref <130)
Total CHOL/HDL Ratio: 3.5 (calc) (ref <5.0)
Triglycerides: 116 mg/dL (ref <150)

## 2021-10-26 LAB — VITAMIN B12: Vitamin B-12: 463 pg/mL (ref 200–1100)

## 2021-10-26 LAB — TESTOSTERONE: Testosterone: 212 ng/dL — ABNORMAL LOW (ref 250–827)

## 2021-10-26 LAB — HEMOGLOBIN A1C
Hgb A1c MFr Bld: 7 % of total Hgb — ABNORMAL HIGH (ref <5.7)
Mean Plasma Glucose: 154 mg/dL
eAG (mmol/L): 8.5 mmol/L

## 2021-10-26 LAB — TSH: TSH: 1.63 mIU/L (ref 0.40–4.50)

## 2021-10-26 LAB — PSA: PSA: 0.14 ng/mL (ref <=4.00)

## 2021-10-26 LAB — URINALYSIS, ROUTINE W REFLEX MICROSCOPIC
Bilirubin Urine: NEGATIVE
Glucose, UA: NEGATIVE
Hgb urine dipstick: NEGATIVE
Ketones, ur: NEGATIVE
Leukocytes,Ua: NEGATIVE
Nitrite: NEGATIVE
Protein, ur: NEGATIVE
Specific Gravity, Urine: 1.014 (ref 1.001–1.035)
pH: 6 (ref 5.0–8.0)

## 2021-10-26 LAB — MAGNESIUM: Magnesium: 1.7 mg/dL (ref 1.5–2.5)

## 2021-10-26 LAB — URIC ACID: Uric Acid, Serum: 7 mg/dL (ref 4.0–8.0)

## 2021-10-26 LAB — IRON, TOTAL/TOTAL IRON BINDING CAP
%SAT: 20 % (calc) (ref 20–48)
Iron: 89 ug/dL (ref 50–180)
TIBC: 438 mcg/dL (calc) — ABNORMAL HIGH (ref 250–425)

## 2021-10-26 LAB — VITAMIN D 25 HYDROXY (VIT D DEFICIENCY, FRACTURES): Vit D, 25-Hydroxy: 79 ng/mL (ref 30–100)

## 2021-10-26 NOTE — Progress Notes (Signed)
<><><><><><><><><><><><><><><><><><><><><><><><><><><><><><><><><> ?<><><><><><><><><><><><><><><><><><><><><><><><><><><><><><><><><> ?-   Test results slightly outside the reference range are not unusual. ?If there is anything important, I will review this with you,  ?otherwise it is considered normal test values.  ?If you have further questions,  ?please do not hesitate to contact me at the office or via My Chart.  ?<><><><><><><><><><><><><><><><><><><><><><><><><><><><><><><><><> ?<><><><><><><><><><><><><><><><><><><><><><><><><><><><><><><><><> ? ?-  Iron  is Normal &OK  ?<><><><><><><><><><><><><><><><><><><><><><><><><><><><><><><><><> ?<><><><><><><><><><><><><><><><><><><><><><><><><><><><><><><><><> ? ?-  Testosterone is low, So please be sure taking  ?                                                              Zinc 50 mg tab Daily which  ?                                                                       can help raise testosterone  levels naturally.  ? ?- Also Daily exercise  for 20-30 minutes   2 x /day   raises Testosterone levels ? ?- Losing Weight raises testosterone levels.  ?<><><><><><><><><><><><><><><><><><><><><><><><><><><><><><><><><> ?<><><><><><><><><><><><><><><><><><><><><><><><><><><><><><><><><> ? ?-  Total Chol = 95   &  LDL Chol = 48   - Both  Excellent  ? ?- Very low risk for Heart Attack  / Stroke ?============================================================ ?============================================================ ? ?-  A1c is better down from 8.3% to now 7.0%  ?             ( But still too high ! )  ?              (  Ideal or Goal is less than 5.7%  !  )  ? ? ?- So work a little harder with diet  ? ?- Avoid Sweets, Candy & White Stuff  ? ?- White Rice, White Potatoes, White Flour  - Breads &  Pasta ?<><><><><><><><><><><><><><><><><><><><><><><><><><><><><><><><><> ?<><><><><><><><><><><><><><><><><><><><><><><><><><><><><><><><><> ? ?-  PSA - Very Low - Excellent  ! ?<><><><><><><><><><><><><><><><><><><><><><><><><><><><><><><><><> ?<><><><><><><><><><><><><><><><><><><><><><><><><><><><><><><><><> ? ?-  Uric Acid  / Gout Test is Normal  ?<><><><><><><><><><><><><><><><><><><><><><><><><><><><><><><><><> ?<><><><><><><><><><><><><><><><><><><><><><><><><><><><><><><><><> ? ?-  Vitamin D = 79 - Excellent  -  Please keep dose Same  ?<><><><><><><><><><><><><><><><><><><><><><><><><><><><><><><><><> ?<><><><><><><><><><><><><><><><><><><><><><><><><><><><><><><><><> ? ?-   Magnesium  -   1.7    -  very  low- goal is betw 2.0 - 2.5,  ? ?- So..............Marland Kitchen  Recommend that you take  ?Magnesium 500 mg tablet daily  ? ?- also important to eat lots of  leafy green vegetables  ? ?- spinach - Kale - collards - greens - okra - asparagus  ?- broccoli - quinoa - squash - almonds  ? ?- black, red, white beans ? ?-  peas - green beans ?<><><><><><><><><><><><><><><><><><><><><><><><><><><><><><><><><> ?<><><><><><><><><><><><><><><><><><><><><><><><><><><><><><><><><> ? ?-  All Else - CBC - Kidneys - Electrolytes - Liver - Magnesium & Thyroid   ? ?-  all  Normal / OK ?<><><><><><><><><><><><><><><><><><><><><><><><><><><><><><><><><> ?<><><><><><><><><><><><><><><><><><><><><><><><><><><><><><><><><> ? ? ? ? ? ? ? ? ? ? ? ? ? ? ? ? ? ? ? ? ? ? ? ? ? ? ? ? ?

## 2021-11-27 ENCOUNTER — Encounter: Payer: Self-pay | Admitting: Internal Medicine

## 2021-11-27 ENCOUNTER — Other Ambulatory Visit: Payer: Self-pay | Admitting: Internal Medicine

## 2021-11-27 MED ORDER — TRULICITY 4.5 MG/0.5ML ~~LOC~~ SOAJ
SUBCUTANEOUS | 3 refills | Status: DC
Start: 2021-11-27 — End: 2022-02-28
  Filled 2021-11-27: qty 6, 84d supply, fill #0
  Filled 2022-02-26: qty 6, 84d supply, fill #1

## 2021-11-28 ENCOUNTER — Other Ambulatory Visit (HOSPITAL_COMMUNITY): Payer: Self-pay

## 2021-11-29 ENCOUNTER — Other Ambulatory Visit (HOSPITAL_COMMUNITY): Payer: Self-pay

## 2021-12-02 ENCOUNTER — Encounter: Payer: Self-pay | Admitting: Internal Medicine

## 2021-12-10 ENCOUNTER — Encounter: Payer: Self-pay | Admitting: Internal Medicine

## 2021-12-10 ENCOUNTER — Ambulatory Visit: Payer: Managed Care, Other (non HMO) | Admitting: Internal Medicine

## 2021-12-10 ENCOUNTER — Other Ambulatory Visit: Payer: Self-pay | Admitting: Internal Medicine

## 2021-12-10 VITALS — BP 136/86 | HR 87 | Temp 97.5°F | Wt 243.0 lb

## 2021-12-10 DIAGNOSIS — E1122 Type 2 diabetes mellitus with diabetic chronic kidney disease: Secondary | ICD-10-CM

## 2021-12-10 DIAGNOSIS — I10 Essential (primary) hypertension: Secondary | ICD-10-CM

## 2021-12-10 DIAGNOSIS — Z79899 Other long term (current) drug therapy: Secondary | ICD-10-CM

## 2021-12-10 DIAGNOSIS — M5412 Radiculopathy, cervical region: Secondary | ICD-10-CM

## 2021-12-10 DIAGNOSIS — M542 Cervicalgia: Secondary | ICD-10-CM

## 2021-12-10 DIAGNOSIS — E1169 Type 2 diabetes mellitus with other specified complication: Secondary | ICD-10-CM

## 2021-12-10 DIAGNOSIS — M4802 Spinal stenosis, cervical region: Secondary | ICD-10-CM | POA: Diagnosis not present

## 2021-12-10 MED ORDER — DEXAMETHASONE 4 MG PO TABS
ORAL_TABLET | ORAL | 0 refills | Status: DC
  Filled 2021-12-10: qty 20, 11d supply, fill #0

## 2021-12-10 NOTE — Progress Notes (Signed)
? ? ?Future Appointments  ?Date Time Provider Department  ?02/05/2022  4:00 PM Shaun Glimpse, NP [facility]  ?05/14/2022  4:00 PM Shaun Cowboy, MD [facility]  ?10/08/2022  3:00 PM Shaun Cowboy, MD [facility]  ? ? ?History of Present Illness: ? ? ?             This is a very nice 56 y.o. RH  & [name] with HTN, HLD, T2_IDDM  and Vitamin D Deficiency who has hx/o Neck pain  due to Cx DJD/ DDD by Cx MRI with RUE radiculitis pains predating to Jan 2000 (. Surgery was recommended  by orthopedics, but cancelled due to Covid era.  Now  patient is having increasing pains from neck  to shoulder / hand fingers with pain, numbness  & tingling.  Has difficulty objects in his dominant Rt hand.  Patient desires consultation with Dr Shaun [name] who has taken excellent care of several of his relatives & was advised to have Cx MRI repeated .  ? ? ?Medications ? ?  Dulaglutide (TRULICITY) 4.5 MG/0.5ML SOPN, Inject 4.5 mg once a week ?  glipiZIDE  5 MG tablet, Take  1 tablet  3 x /day  with Meals  ?  NOVOLIN 70/30 Take 35 u - am   &   30 u - pm   or as directed ?  metFORMIN -XR 500 MG  Take 2 tablets 2 x /Day  ?  atenolol 100 MG tablet, Take  1 tablet  Daily for BP  / Patient knows to take by mouth ?  ezetimibe  10 MG tablet, Take 1 tablet Daily  ?  fenofibrate 160 MG tablet, Take 1 tablet Daily  ?  olmesartan  40 MG tablet, Take 1/2 to 1 tablet  at night for BP ?  tadalafil  20 MG tablet, Take  1/2 to 1 tablet  every 2 to 3 days  as needed for  XXXX ?  ipratropium 0.06 % nasal spray, Use 1 to 2 sprays each nostril 2 to 3 x /day as needed ?  aspirin 81 MG tablet, Take 8 daily. ?  B-12 2500 MCG SL, Place 1 tablet under the tongue daily. ?  VITAMIN D 2000 units CAPS, Take 4 capsules by mouth daily.  ?  Cinnamon 500 MG capsule, Take 500 mg by mouth 4 (four) times daily. ?  pregabalin 100 MG , Take  1 capsule  2 x /day  with Bkfst & Lunch  and 2 capsules  at Bedtime  for Painful Diabetic Neuropathy ?  traZODone  150 MG tablet, 1/2-1 tablet at  night as needed for sleep ? ?Problem list ?He has Essential hypertension; T2_NIDDM; GERD; Asthma; Idiopathic gout; Testosterone deficiency; Medication management; Vitamin D deficiency; Obesity (BMI 30.0-34.9); Former smoker; FH: hypertension; Diabetic polyneuropathy associated with type 2 diabetes mellitus (HCC); Hyperlipidemia associated with type 2 diabetes mellitus (HCC); Insomnia; B12 deficiency; Lumbar degenerative disc disease; and Hypercalcemia on their problem list. ?  ?Observations/Objective: ? ?BP 136/86   Pulse 87   Temp (!) 97.5 ?F (36.4 ?C)   Wt 243 lb (110.2 kg)   SpO2 99%   BMI 34.62 kg/m?  ? ?HEENT - WNL. ?Neck - supple.  ?Chest - Clear equal BS. ?Cor - Nl HS. RRR w/o sig MGR. PP 1(+). No edema. ?MS- FROM w/o deformities.  Gait Nl. Grips equal.  ?Neuro -  Nl w/o focal abnormalities. DTR's of UE - flat.  ? ?Assessment and Plan: ? ?1. Cervical spinal stenosis ? ?2.  Foraminal stenosis of cervical region ? ?3. Cervical radiculitis ? ?4. Cervicalgia ? ?- dexamethasone  4 MG tablet;  ?Take 1 tab 3 x day - 3 days, then 2 x day - 3 days, then 1 tab daily   ?Dispense: 20 tablet ? ?- MR Cervical Spine Wo Contrast; Future ? ?- Ref to Dr Shaun [name]  ? ? ?Follow Up Instructions: ? ? ?    I discussed the assessment and treatment plan with the patient. The patient was provided an opportunity to ask questions and all were answered. The patient agreed with the plan and demonstrated an understanding of the instructions. ?  ?    The patient was advised to call back or seek an in-person evaluation if the symptoms worsen or if the condition fails to improve as anticipated. ? ? ?Shaun Maw, MD ? ?

## 2021-12-10 NOTE — Patient Instructions (Signed)
Spinal Stenosis  Spinal stenosis is a condition that happens when the spinal canal narrows. The spinal canal is the space between the bones of your spine (vertebrae). This narrowing puts pressure on the spinal cord or nerves. Spinal stenosis can affect the vertebrae in the neck, upper back, and lower back. This condition can range from mild to severe. In some cases, there are no symptoms. What are the causes? This condition is caused by areas of bone pushing into the spinal canal. This condition may be present at birth (congenital), or it may be caused by: Slow breakdown of your vertebrae (spinal degeneration). This usually starts around 57 years of age. Injury (trauma) to your spine. Tumors in your spine. Calcium deposits in your spine. What increases the risk? The following factors may make you more likely to develop this condition: Being older than age 50. Having a problem present at birth with an abnormally shaped spine (congenitalspinal deformity), such as scoliosis. Having arthritis. What are the signs or symptoms? Symptoms of this condition include: Pain in the neck or back that is generally worse with activities, particularly when you stand or walk. Numbness, tingling, hot or cold sensations, weakness, or tiredness (fatigue) in your leg or legs. Pain going from the buttock, down the thigh, and to the calf (sciatica). This can happen in one or both legs. Frequent episodes of falling. A foot-slapping gait that leads to muscle weakness. In more severe cases, you may develop: Problems having a bowel movement or urinating. Difficulty having sex. Loss of feeling in your legs and inability to walk. Symptoms may come on slowly and get worse over time. In some cases, there are no symptoms. How is this diagnosed? This condition is diagnosed based on your medical history and a physical exam. You will also have tests, such as an MRI, a CT scan, or an X-ray. How is this treated? Treatment  for this condition often focuses on managing your pain and any other symptoms. Treatment may include: Practicing good posture to lessen pressure on your nerves. Exercising to strengthen muscles, build endurance, improve balance, and maintain range of motion. This may include physical therapy to restore movement and strength to your back. Losing weight, if needed. Medicines to reduce inflammation or pain. This may include a medicine that is injected into your spine (steroidinjection). Assistive devices, such as a corset or brace. In some cases, surgery may be needed. The most common procedure is decompression laminectomy. This is done to remove excess bone that puts pressure on your nerve roots. Follow these instructions at home: Managing pain, stiffness, and swelling  Practice good posture. If you were given a brace or a corset, wear it as told by your health care provider. Maintain a healthy weight. Talk with your health care provider if you need help losing weight. If directed, apply heat to the affected area as often as told by your health care provider. Use the heat source that your health care provider recommends, such as a moist heat pack or a heating pad. Place a towel between your skin and the heat source. Leave the heat on for 20-30 minutes. Remove the heat if your skin turns bright red. This is especially important if you are unable to feel pain, heat, or cold. You may have a greater risk of getting burned. Activity Do all exercises and stretches as told by your health care provider. Do not do any activities that cause pain. Ask your health care provider what activities are safe for you.   Do not lift anything that is heavier than 10 lb (4.5 kg), or the limit that you are told by your health care provider. Return to your normal activities as told by your health care provider. Ask your health care provider what activities are safe for you. General instructions Take over-the-counter and  prescription medicines only as told by your health care provider. Do not use any products that contain nicotine or tobacco, such as cigarettes, e-cigarettes, and chewing tobacco. If you need help quitting, ask your health care provider. Eat a healthy diet. This includes plenty of fruits and vegetables, whole grains, and low-fat (lean) protein. Keep all follow-up visits as told by your health care provider. This is important. Contact a health care provider if: Your symptoms do not get better or they get worse. You have a fever. Get help right away if: You have new pain or symptoms of severe pain, such as: New or worsening pain in your neck or upper back. Severe pain that cannot be controlled with medicines. A severe headache that gets worse when you stand. You are dizzy. You have vision problems, such as blurred vision or double vision. You have nausea or you vomit. You develop new or worsening numbness or tingling in your back or legs. You have pain, redness, swelling, or warmth in your arm or leg. Summary Spinal stenosis is a condition that happens when the spinal canal narrows. The spinal canal is the space between the bones of your spine (vertebrae). This narrowing puts pressure on the spinal cord or nerves. This condition may be caused by a birth defect, breakdown of your vertebrae, trauma, tumors, or calcium deposits. Spinal stenosis can cause numbness, weakness, or pain in the buttocks, neck, back, and legs. This condition is usually diagnosed with your medical history, a physical exam, and tests, such as an MRI, a CT scan, or an X-ray. This information is not intended to replace advice given to you by your health care provider. Make sure you discuss any questions you have with your health care provider. Document Revised: 08/21/2021 Document Reviewed: 05/19/2019 [employer] Patient Education  2023 [employer]  

## 2021-12-11 ENCOUNTER — Other Ambulatory Visit (HOSPITAL_COMMUNITY): Payer: Self-pay

## 2021-12-12 ENCOUNTER — Other Ambulatory Visit: Payer: Self-pay | Admitting: Internal Medicine

## 2021-12-20 ENCOUNTER — Other Ambulatory Visit: Payer: BC Managed Care – PPO

## 2021-12-31 ENCOUNTER — Other Ambulatory Visit (HOSPITAL_COMMUNITY): Payer: Self-pay

## 2021-12-31 ENCOUNTER — Other Ambulatory Visit: Payer: Self-pay | Admitting: Internal Medicine

## 2021-12-31 DIAGNOSIS — E119 Type 2 diabetes mellitus without complications: Secondary | ICD-10-CM

## 2021-12-31 MED ORDER — METFORMIN HCL ER 500 MG PO TB24
ORAL_TABLET | ORAL | 3 refills | Status: DC
  Filled 2021-12-31: qty 360, 90d supply, fill #0

## 2022-01-29 ENCOUNTER — Other Ambulatory Visit: Payer: Self-pay | Admitting: Internal Medicine

## 2022-01-29 ENCOUNTER — Other Ambulatory Visit: Payer: Self-pay | Admitting: Neurosurgery

## 2022-01-29 DIAGNOSIS — M542 Cervicalgia: Secondary | ICD-10-CM

## 2022-01-31 ENCOUNTER — Other Ambulatory Visit: Payer: Self-pay | Admitting: Neurosurgery

## 2022-01-31 DIAGNOSIS — M5412 Radiculopathy, cervical region: Secondary | ICD-10-CM

## 2022-02-05 ENCOUNTER — Ambulatory Visit: Payer: Managed Care, Other (non HMO) | Admitting: Nurse Practitioner

## 2022-02-13 ENCOUNTER — Ambulatory Visit
Admission: RE | Admit: 2022-02-13 | Discharge: 2022-02-13 | Disposition: A | Discharge status: Home / Self Care | Payer: Managed Care, Other (non HMO) | Source: Ambulatory Visit | Attending: Neurosurgery | Admitting: Neurosurgery

## 2022-02-13 DIAGNOSIS — M5412 Radiculopathy, cervical region: Secondary | ICD-10-CM

## 2022-02-17 ENCOUNTER — Other Ambulatory Visit: Payer: Self-pay | Admitting: Internal Medicine

## 2022-02-17 DIAGNOSIS — I10 Essential (primary) hypertension: Secondary | ICD-10-CM

## 2022-02-17 DIAGNOSIS — E782 Mixed hyperlipidemia: Secondary | ICD-10-CM

## 2022-02-17 DIAGNOSIS — E1165 Type 2 diabetes mellitus with hyperglycemia: Secondary | ICD-10-CM

## 2022-02-17 MED ORDER — OLMESARTAN MEDOXOMIL 40 MG PO TABS: ORAL_TABLET | ORAL | 3 refills | Status: DC

## 2022-02-17 MED ORDER — ATENOLOL 100 MG PO TABS: ORAL_TABLET | ORAL | 3 refills | Status: DC

## 2022-02-17 MED ORDER — FENOFIBRATE 160 MG PO TABS: ORAL_TABLET | ORAL | 3 refills | Status: DC

## 2022-02-17 MED ORDER — GLIPIZIDE 5 MG PO TABS: ORAL_TABLET | ORAL | 3 refills | Status: DC

## 2022-02-17 MED ORDER — EZETIMIBE 10 MG PO TABS: ORAL_TABLET | ORAL | 3 refills | Status: DC

## 2022-02-20 ENCOUNTER — Encounter: Payer: Self-pay | Admitting: Nurse Practitioner

## 2022-02-20 ENCOUNTER — Ambulatory Visit: Payer: Managed Care, Other (non HMO) | Admitting: Nurse Practitioner

## 2022-02-20 VITALS — BP 126/82 | HR 87 | Temp 96.9°F | Ht 70.0 in | Wt 245.0 lb

## 2022-02-20 DIAGNOSIS — E1169 Type 2 diabetes mellitus with other specified complication: Secondary | ICD-10-CM

## 2022-02-20 DIAGNOSIS — I10 Essential (primary) hypertension: Secondary | ICD-10-CM

## 2022-02-20 DIAGNOSIS — E559 Vitamin D deficiency, unspecified: Secondary | ICD-10-CM

## 2022-02-20 DIAGNOSIS — E785 Hyperlipidemia, unspecified: Secondary | ICD-10-CM | POA: Diagnosis not present

## 2022-02-20 DIAGNOSIS — E669 Obesity, unspecified: Secondary | ICD-10-CM | POA: Diagnosis not present

## 2022-02-20 DIAGNOSIS — J4520 Mild intermittent asthma, uncomplicated: Secondary | ICD-10-CM

## 2022-02-20 DIAGNOSIS — E119 Type 2 diabetes mellitus without complications: Secondary | ICD-10-CM

## 2022-02-20 DIAGNOSIS — Z79899 Other long term (current) drug therapy: Secondary | ICD-10-CM

## 2022-02-20 DIAGNOSIS — K2100 Gastro-esophageal reflux disease with esophagitis, without bleeding: Secondary | ICD-10-CM

## 2022-02-20 NOTE — Progress Notes (Signed)
FOLLOW UP  Assessment and Plan:   Hypertension Well controlled with current medications  Monitor blood pressure at home; patient to call if consistently greater than 130/80 Continue DASH diet.   Reminder to go to the ER if any CP, SOB, nausea, dizziness, severe HA, changes vision/speech, left arm numbness and tingling and jaw pain.  Cholesterol Currently at goal;  Continue low cholesterol diet and exercise.  Check lipid panel.   Diabetes  Continue medication: Continue diet and exercise.  Perform daily foot/skin check, notify office of any concerning changes.  Check A1C  Obesity with co morbidities Long discussion about weight loss, diet, and exercise Recommended diet heavy in fruits and veggies and low in animal meats, cheeses, and dairy products, appropriate calorie intake Discussed ideal weight for height   Vitamin D Def At goal at last visit; continue supplementation to maintain goal of 60-100 Defer Vit D level  Medication Management All medications discussed and reviewed in full. All questions and concerns regarding medications addressed.     Continue diet and meds as discussed. Further disposition pending results of labs. Discussed med's effects and SE's.   Over 30 minutes of exam, counseling, chart review, and critical decision making was performed.   Future Appointments  Date Time Provider [place]  05/26/2022  4:00 PM Unk Pinto, MD [facility] None  10/08/2022  3:00 PM Unk Pinto, MD [facility] None    ----------------------------------------------------------------------------------------------------------------------  HPI 56 y.o. male  presents for 3 month follow up on hypertension, cholesterol, diabetes, weight and vitamin D deficiency.   BMI is Body mass index is 35.15 kg/m., he has not been working on diet and exercise. Wt Readings from Last 3 Encounters:  02/20/22 245 lb (111.1 kg)  12/10/21 243 lb (110.2 kg)  10/25/21 241 lb  12.8 oz (109.7 kg)    His blood pressure has been controlled at home, today their BP is BP: 126/82  He does not workout. He denies chest pain, shortness of breath, dizziness.   He is on cholesterol medication Zetia and denies myalgias. His cholesterol is at goal. The cholesterol last visit was:   Lab Results  Component Value Date   CHOL 109 02/20/2022   HDL 26 (L) 02/20/2022   LDLCALC 55 02/20/2022   TRIG 212 (H) 02/20/2022   CHOLHDL 4.2 02/20/2022    He has been working on diet and exercise for prediabetes, and denies polydipsia. Last A1C in the office was:  Lab Results  Component Value Date   HGBA1C 7.2 (H) 02/20/2022   Patient is on Vitamin D supplement.   Lab Results  Component Value Date   VD25OH 79 10/25/2021        Current Medications:  Current Outpatient Medications on File Prior to Visit  Medication Sig   aspirin 81 MG tablet Take 81 mg by mouth daily.   atenolol (TENORMIN) 100 MG tablet Take  1 tablet  Daily for BP   Blood Glucose Monitoring Suppl (CONTOUR NEXT MONITOR) w/Device KIT 1 Device by Does not apply route 3 (three) times daily.   Cholecalciferol (VITAMIN D) 2000 units CAPS Take 4 capsules by mouth daily.    Cinnamon 500 MG capsule Take 500 mg by mouth 4 (four) times daily.   Cyanocobalamin (B-12) 2500 MCG SUBL Place 1 tablet under the tongue daily.   Dulaglutide (TRULICITY) 4.5 JQ/7.3AL SOPN Inject 4.5 mg once a week for Diabetes   ezetimibe (ZETIA) 10 MG tablet Take 1 tablet Daily for Cholesterol   glipiZIDE (GLUCOTROL) 5 MG  tablet Take  1 tablet  3 x /day  with Meals for Diabetes /   glucose blood (CONTOUR NEXT TEST) test strip Test blood sugar  three times a day before meals   insulin NPH-regular Human (NOVOLIN 70/30) (70-30) 100 UNIT/ML injection Take 35 u - am   &   30 u - pm   or as directed   ipratropium (ATROVENT) 0.06 % nasal spray Use 1 to 2 sprays each nostril 2 to 3 x /day as needed   metFORMIN (GLUCOPHAGE-XR) 500 MG 24 hr tablet Take 2  tablets twice daily with food for diabetes   MICROLET LANCETS MISC USE TO TEST UP TO THREE TIMES DAILY AS DIRECTED   olmesartan (BENICAR) 40 MG tablet Take 1/2 to 1 tablet  at night for BP   pregabalin (LYRICA) 100 MG capsule Take  1 capsule  2 x /day  with Bkfst & Lunch  and 2 capsules  at Bedtime  for Painful Diabetic Neuropathy   tadalafil (CIALIS) 20 MG tablet Take  1/2 to 1 tablet  every 2 to 3 days  as needed for  XXXX   traZODone (DESYREL) 150 MG tablet 1/2-1 tablet at night as needed for sleep   No current facility-administered medications on file prior to visit.     Allergies:  Allergies  Allergen Reactions   Phentermine Other (See Comments)    Vision loss   Topamax [Topiramate] Other (See Comments)    Vision changes/loss of vision   Zocor [Simvastatin]     Headache   Zoloft [Sertraline Hcl]     dysphona     Medical History:  Past Medical History:  Diagnosis Date   Asthma    Diabetes mellitus without complication ([place])    Fatty liver disease, nonalcoholic    GERD (gastroesophageal reflux disease)    Gout    Hyperlipidemia    Hypertension    Hypogonadism male    Obesity    Venous insufficiency    Family history- Reviewed and unchanged Social history- Reviewed and unchanged   Review of Systems:  ROS    Physical Exam: BP 126/82   Pulse 87   Temp (!) 96.9 F (36.1 C)   Ht _0  (1.778 m)   Wt 245 lb (111.1 kg)   SpO2 99%   BMI 35.15 kg/m  Wt Readings from Last 3 Encounters:  02/20/22 245 lb (111.1 kg)  12/10/21 243 lb (110.2 kg)  10/25/21 241 lb 12.8 oz (109.7 kg)   General Appearance: Well nourished, in no apparent distress. Eyes: PERRLA, EOMs, conjunctiva no swelling or erythema Sinuses: No Frontal/maxillary tenderness ENT/Mouth: Ext aud canals clear, TMs without erythema, bulging. No erythema, swelling, or exudate on post pharynx.  Tonsils not swollen or erythematous. Hearing normal.  Neck: Supple, thyroid normal.  Respiratory: Respiratory  effort normal, BS equal bilaterally without rales, rhonchi, wheezing or stridor.  Cardio: RRR with no MRGs. Brisk peripheral pulses without edema.  Abdomen: Soft, + BS.  Non tender, no guarding, rebound, hernias, masses. Lymphatics: Non tender without lymphadenopathy.  Musculoskeletal: Full ROM, 5/5 strength, Normal gait Skin: Warm, dry without rashes, lesions, ecchymosis.  Neuro: Cranial nerves intact. No cerebellar symptoms.  Psych: Awake and oriented X 3, normal affect, Insight and Judgment appropriate.    Darrol Jump, NP 7:39 AM [facility]

## 2022-02-20 NOTE — Patient Instructions (Signed)

## 2022-02-21 LAB — CBC WITH DIFFERENTIAL/PLATELET
Absolute Monocytes: 531 cells/uL (ref 200–950)
Basophils Absolute: 38 cells/uL (ref 0–200)
Basophils Relative: 0.6 %
Eosinophils Absolute: 243 cells/uL (ref 15–500)
Eosinophils Relative: 3.8 %
HCT: 41.2 % (ref 38.5–50.0)
Hemoglobin: 14 g/dL (ref 13.2–17.1)
Lymphs Abs: 2976 cells/uL (ref 850–3900)
MCH: 28.6 pg (ref 27.0–33.0)
MCHC: 34 g/dL (ref 32.0–36.0)
MCV: 84.3 fL (ref 80.0–100.0)
MPV: 10 fL (ref 7.5–12.5)
Monocytes Relative: 8.3 %
Neutro Abs: 2611 cells/uL (ref 1500–7800)
Neutrophils Relative %: 40.8 %
Platelets: 191 10*3/uL (ref 140–400)
RBC: 4.89 10*6/uL (ref 4.20–5.80)
RDW: 14.2 % (ref 11.0–15.0)
Total Lymphocyte: 46.5 %
WBC: 6.4 10*3/uL (ref 3.8–10.8)

## 2022-02-21 LAB — COMPLETE METABOLIC PANEL WITH GFR
AG Ratio: 2.2 (calc) (ref 1.0–2.5)
ALT: 30 U/L (ref 9–46)
AST: 19 U/L (ref 10–35)
Albumin: 4.4 g/dL (ref 3.6–5.1)
Alkaline phosphatase (APISO): 34 U/L — ABNORMAL LOW (ref 35–144)
BUN: 19 mg/dL (ref 7–25)
CO2: 27 mmol/L (ref 20–32)
Calcium: 10.2 mg/dL (ref 8.6–10.3)
Chloride: 105 mmol/L (ref 98–110)
Creat: 0.93 mg/dL (ref 0.70–1.30)
Globulin: 2 g/dL (calc) (ref 1.9–3.7)
Glucose, Bld: 66 mg/dL (ref 65–99)
Potassium: 4.6 mmol/L (ref 3.5–5.3)
Sodium: 142 mmol/L (ref 135–146)
Total Bilirubin: 0.4 mg/dL (ref 0.2–1.2)
Total Protein: 6.4 g/dL (ref 6.1–8.1)
eGFR: 96 mL/min/{1.73_m2} (ref >=60)

## 2022-02-21 LAB — HEMOGLOBIN A1C
Hgb A1c MFr Bld: 7.2 % of total Hgb — ABNORMAL HIGH (ref <5.7)
Mean Plasma Glucose: 160 mg/dL
eAG (mmol/L): 8.9 mmol/L

## 2022-02-21 LAB — LIPID PANEL
Cholesterol: 109 mg/dL (ref <200)
HDL: 26 mg/dL — ABNORMAL LOW (ref >=40)
LDL Cholesterol (Calc): 55 mg/dL (calc)
Non-HDL Cholesterol (Calc): 83 mg/dL (calc) (ref <130)
Total CHOL/HDL Ratio: 4.2 (calc) (ref <5.0)
Triglycerides: 212 mg/dL — ABNORMAL HIGH (ref <150)

## 2022-02-23 ENCOUNTER — Other Ambulatory Visit: Payer: Self-pay | Admitting: Internal Medicine

## 2022-02-23 DIAGNOSIS — E782 Mixed hyperlipidemia: Secondary | ICD-10-CM

## 2022-02-23 DIAGNOSIS — I10 Essential (primary) hypertension: Secondary | ICD-10-CM

## 2022-02-23 MED ORDER — FENOFIBRATE 160 MG PO TABS
ORAL_TABLET | ORAL | 3 refills | Status: DC
Start: 2022-02-23 — End: 2022-02-23

## 2022-02-23 MED ORDER — FENOFIBRATE 160 MG PO TABS: ORAL_TABLET | ORAL | 3 refills | Status: DC

## 2022-02-27 ENCOUNTER — Telehealth: Payer: Self-pay | Admitting: Nurse Practitioner

## 2022-02-27 ENCOUNTER — Other Ambulatory Visit (HOSPITAL_COMMUNITY): Payer: Self-pay

## 2022-02-27 NOTE — Telephone Encounter (Signed)
Patient states that pharmacy is faxing something over regarding a PA on his Trulicity. Wanting to check to see if we received it yet? He only has one dose left for this weekend and he will be out. If PA can't get done before he is due for another injection..do we have a sample?

## 2022-02-28 ENCOUNTER — Other Ambulatory Visit: Payer: Self-pay | Admitting: Internal Medicine

## 2022-02-28 ENCOUNTER — Other Ambulatory Visit (HOSPITAL_COMMUNITY): Payer: Self-pay

## 2022-02-28 MED ORDER — TRULICITY 4.5 MG/0.5ML ~~LOC~~ SOAJ
SUBCUTANEOUS | 3 refills | Status: DC
Start: 2022-02-28 — End: 2022-10-08

## 2022-03-03 ENCOUNTER — Encounter: Payer: Self-pay | Admitting: Internal Medicine

## 2022-04-09 ENCOUNTER — Other Ambulatory Visit: Payer: Self-pay | Admitting: Internal Medicine

## 2022-04-09 DIAGNOSIS — E119 Type 2 diabetes mellitus without complications: Secondary | ICD-10-CM

## 2022-04-09 MED ORDER — METFORMIN HCL ER 500 MG PO TB24: ORAL_TABLET | ORAL | 0 refills | Status: DC

## 2022-04-17 ENCOUNTER — Ambulatory Visit: Payer: Managed Care, Other (non HMO) | Admitting: Nurse Practitioner

## 2022-04-17 ENCOUNTER — Other Ambulatory Visit (HOSPITAL_COMMUNITY): Payer: Self-pay

## 2022-04-17 VITALS — BP 116/82 | HR 95 | Temp 97.0°F | Ht 70.0 in | Wt 234.0 lb

## 2022-04-17 DIAGNOSIS — R0981 Nasal congestion: Secondary | ICD-10-CM

## 2022-04-17 DIAGNOSIS — R051 Acute cough: Secondary | ICD-10-CM

## 2022-04-17 DIAGNOSIS — J00 Acute nasopharyngitis [common cold]: Secondary | ICD-10-CM | POA: Diagnosis not present

## 2022-04-17 MED ORDER — AZITHROMYCIN 500 MG PO TABS: ORAL_TABLET | ORAL | 0 refills | Status: DC

## 2022-04-17 MED ORDER — PREDNISONE 20 MG PO TABS: ORAL_TABLET | ORAL | 0 refills | Status: DC

## 2022-04-17 MED ORDER — PROMETHAZINE-DM 6.25-15 MG/5ML PO SYRP: 5.0000 mL | ORAL_SOLUTION | Freq: Four times a day (QID) | ORAL | 0 refills | Status: DC | PRN

## 2022-04-17 NOTE — Patient Instructions (Signed)

## 2022-04-17 NOTE — Progress Notes (Signed)
Assessment and Plan:  Shaun [name] was seen today for an episodic visit.  Diagnoses and all order for this visit:  1. Acute nasopharyngitis Stay well hydrated to keep mucus thin and productive. OTC antihistamine PRN Mucinex PRN  - azithromycin (ZITHROMAX) 500 MG tablet; Take 1 tab (500 mg) daily until complete.  Dispense: 10 tablet; Refill: 0  2. Acute cough  - predniSONE (DELTASONE) 20 MG tablet; Take 2 tabs (40 mg) for 3 days followed by 1 tab (20 mg) for 4 days.  Dispense: 10 tablet; Refill: 0 - promethazine-dextromethorphan (PROMETHAZINE-DM) 6.25-15 MG/5ML syrup; Take 5 mLs by mouth 4 (four) times daily as needed for cough.  Dispense: 240 mL; Refill: 0  3. Nasal congestion  - predniSONE (DELTASONE) 20 MG tablet; Take 2 tabs (40 mg) for 3 days followed by 1 tab (20 mg) for 4 days.  Dispense: 10 tablet; Refill: 0 - promethazine-dextromethorphan (PROMETHAZINE-DM) 6.25-15 MG/5ML syrup; Take 5 mLs by mouth 4 (four) times daily as needed for cough.  Dispense: 240 mL; Refill: 0   Notify office for further evaluation and treatment, questions or concerns if s/s fail to improve. The risks and benefits of my recommendations, as well as other treatment options were discussed with the patient today. Questions were answered.  Further disposition pending results of labs. Discussed med's effects and SE's.    Over 15 minutes of exam, counseling, chart review, and critical decision making was performed.   Future Appointments  Date Time Provider [place]  05/26/2022  4:00 PM Unk Pinto, MD [facility] None  10/08/2022  3:00 PM Unk Pinto, MD [facility] None    ------------------------------------------------------------------------------------------------------------------   HPI BP 116/82   Pulse 95   Temp (!) 97 F (36.1 C)   Ht _0  (1.778 m)   Wt 234 lb (106.1 kg)   SpO2 96%   BMI 33.58 kg/m    56 y.o.male presents for symptoms of a URI, possible  sinusitis. Symptoms include congestion, cough described as productive, nasal congestion, and sneezing. Onset of symptoms was 1 week ago, and has been unchanged since that time. Treatment to date: antihistamines, cough suppressants, decongestants, and nasal steroids. Denies fever, chills, N/V.   Past Medical History:  Diagnosis Date   Asthma    Diabetes mellitus without complication ([place])    Fatty liver disease, nonalcoholic    GERD (gastroesophageal reflux disease)    Gout    Hyperlipidemia    Hypertension    Hypogonadism male    Obesity    Venous insufficiency      Allergies  Allergen Reactions   Phentermine Other (See Comments)    Vision loss   Topamax [Topiramate] Other (See Comments)    Vision changes/loss of vision   Zocor [Simvastatin]     Headache   Zoloft [Sertraline Hcl]     dysphona    Current Outpatient Medications on File Prior to Visit  Medication Sig   aspirin 81 MG tablet Take 81 mg by mouth daily.   Blood Glucose Monitoring Suppl (CONTOUR NEXT MONITOR) w/Device KIT 1 Device by Does not apply route 3 (three) times daily.   Cholecalciferol (VITAMIN D) 2000 units CAPS Take 4 capsules by mouth daily.    Cinnamon 500 MG capsule Take 500 mg by mouth 4 (four) times daily.   Dulaglutide (TRULICITY) 4.5 YQ/8.2NO SOPN Inject 4.5 mg once a week for Diabetes   ezetimibe (ZETIA) 10 MG tablet Take 1 tablet Daily for Cholesterol   glipiZIDE (GLUCOTROL) 5 MG tablet Take  1 tablet  3 x /day  with Meals for Diabetes /   glucose blood (CONTOUR NEXT TEST) test strip Test blood sugar  three times a day before meals   insulin NPH-regular Human (NOVOLIN 70/30) (70-30) 100 UNIT/ML injection Take 35 u - am   &   30 u - pm   or as directed   metFORMIN (GLUCOPHAGE-XR) 500 MG 24 hr tablet Take  2 tablets  2 x / day  with Meals  for Diabetes   MICROLET LANCETS MISC USE TO TEST UP TO THREE TIMES DAILY AS DIRECTED   olmesartan (BENICAR) 40 MG tablet Take 1/2 to 1 tablet  at night for BP    tadalafil (CIALIS) 20 MG tablet Take  1/2 to 1 tablet  every 2 to 3 days  as needed for  XXXX   No current facility-administered medications on file prior to visit.    ROS: all negative except what is noted in the HPI.   Physical Exam:  BP 116/82   Pulse 95   Temp (!) 97 F (36.1 C)   Ht _0  (1.778 m)   Wt 234 lb (106.1 kg)   SpO2 96%   BMI 33.58 kg/m   General Appearance: NAD.  Awake, conversant and cooperative. Eyes: PERRLA, EOMs intact.  Sclera white.  Conjunctiva without erythema. Sinuses: Frontal/maxillary tenderness.  No nasal discharge. Nares patent.  ENT/Mouth: Ext aud canals clear.  Bilateral TMs w/DOL and without erythema or bulging. Hearing intact.  Posterior pharynx without swelling or exudate.  Tonsils without swelling or erythema.  Neck: Supple.  No masses, nodules or thyromegaly. Respiratory: Effort is regular with non-labored breathing. Breath sounds are equal bilaterally without rales, rhonchi, wheezing or stridor.  Cardio: RRR with no MRGs. Brisk peripheral pulses without edema.  Abdomen: Active BS in all four quadrants.  Soft and non-tender without guarding, rebound tenderness, hernias or masses. Lymphatics: Non tender without lymphadenopathy.  Musculoskeletal: Full ROM, 5/5 strength, normal ambulation.  No clubbing or cyanosis. Skin: Appropriate color for ethnicity. Warm without rashes, lesions, ecchymosis, ulcers.  Neuro: CN II-XII grossly normal. Normal muscle tone without cerebellar symptoms and intact sensation.   Psych: AO X 3,  appropriate mood and affect, insight and judgment.     Darrol Jump, NP 11:49 AM [facility]

## 2022-05-14 ENCOUNTER — Ambulatory Visit: Payer: Managed Care, Other (non HMO) | Admitting: Internal Medicine

## 2022-05-25 NOTE — Progress Notes (Unsigned)
Future Appointments  Date Time Provider Department  05/26/2022  4:00 PM Unk Pinto, MD [facility]  10/08/2022  3:00 PM Unk Pinto, MD [facility]    History of Present Illness:      This very nice 56 y.o. [name] with HTN, HLD, Insulin requiting T2_DM and Vitamin D Deficiency  presents for 6  month follow up. Patient also has hx/o Gout, but is off of meds.         Patient's HTN predates circa 2004.   Patient's BP has been controlled at home.  Today's BP was initially elevated & rechecked at goal - 132/76 .   Patient has had no complaints of any cardiac type chest pain, palpitations, dyspnea / orthopnea / PND, dizziness, claudication, or dependent edema.        Hyperlipidemia is controlled with diet & meds. Patient denies myalgias or other med SE's. Last Lipids were near goal except elevated Trig's :  Lab Results  Component Value Date   CHOL 109 02/20/2022   HDL 26 (L) 02/20/2022   LDLCALC 55 02/20/2022   TRIG 212 (H) 02/20/2022   CHOLHDL 4.2 02/20/2022     Also, the patient has history of  Insulin Dependent T2_DM (2004)w/CKD2.  In Feb 2020, patient was started on Novolin 70/30.  In Nov 2020 (weight was 250# / BMI 34.45 ) ,  he was started on Ozempic .    Patient has had no symptoms of reactive hypoglycemia, diabetic polys or visual blurring, but he does have painful burning dysthesias of the distal LE's not controlled on his current dose of Lyrica.   Last A1c was not at goal:  Lab Results  Component Value Date   HGBA1C 7.2 (H) 02/20/2022                                                         Further, the patient also has history of Vitamin D Deficiency ("15" /2009) and supplements vitamin D without any suspected side-effects. Last vitamin D was at goal:  Lab Results  Component Value Date   VD25OH 79 10/25/2021       Current Outpatient Medications on File Prior to Visit  Medication Sig   aspirin 81 MG tablet Take daily.   atenolol 100 MG tablet Take  1  tablet  Daily for BP    VITAMIN D 2000 u Take 4 capsules daily.    Cinnamon 500 MG caps Take 4 times daily.   B-12 2500 MCG SL Place 1 tablet under the tongue daily.   ezetimibe  10 MG tablet Take 1 tablet daily.   fenofibrate 160 MG tablet Take 1 tablet Daily    glipiZIDE 5 MG tablet Take 1 tablet 3 x /day with Meals   ATROVENT  0.06 % nasal spray Use 1 to 2 sprays each nostril 2 to 3 x /day as needed   metFORMIN -XR 500 MG  Take 2 tablets 2 x /Day   olmesartan 40 MG tablet Take 1/2 to 1 tablet at night for BP   Novolin N 70/30 -injects 35 units in the AM and 30 units in the PM.   OZEMPIC, 1 MG/DOSE, 4 MG/3ML  INJECT 1 MG INTO THE SKIN ONCE A WEEK   pregabalin (LYRICA) 100 MG capsule TAKE 1 CAPSULE TWICE DAILY  traZODone 150 MG tablet 1/2-1 tablet at night as needed for sleep     Allergies  Allergen Reactions   Phentermine Other (See Comments)    Vision loss   Topamax [Topiramate] Other (See Comments)    Vision changes/loss of vision   Zocor [Simvastatin]     Headache   Zoloft [Sertraline Hcl]     dysphona     PMHx:   Past Medical History:  Diagnosis Date   Asthma    Diabetes mellitus without complication ([place])    Fatty liver disease, nonalcoholic    GERD (gastroesophageal reflux disease)    Gout    Hyperlipidemia    Hypertension    Hypogonadism male    Obesity    Venous insufficiency      Immunization History  Administered Date(s) Administered   PPD Test 06/21/2018, 07/27/2019, 08/27/2020   Pneumococcal - 23 07/27/2019   Pneumococcal - 23 02/09/2013   Tdap 02/09/2013     Past Surgical History:  Procedure Laterality Date   ANTERIOR CRUCIATE LIGAMENT REPAIR Left 2001   [place], 2002   gastritis, esophagititis   KNEE ARTHROSCOPY Right 2002, 1995    FHx:    Reviewed / unchanged  SHx:    Reviewed / unchanged   Systems Review:  Constitutional: Denies fever, chills, wt changes, headaches, insomnia, fatigue,  night sweats, change in appetite. Eyes: Denies redness, blurred vision, diplopia, discharge, itchy, watery eyes.  ENT: Denies discharge, congestion, post nasal drip, epistaxis, sore throat, earache, hearing loss, dental pain, tinnitus, vertigo, sinus pain, snoring.  CV: Denies chest pain, palpitations, irregular heartbeat, syncope, dyspnea, diaphoresis, orthopnea, PND, claudication or edema. Respiratory: denies cough, dyspnea, DOE, pleurisy, hoarseness, laryngitis, wheezing.  Gastrointestinal: Denies dysphagia, odynophagia, heartburn, reflux, water brash, abdominal pain or cramps, nausea, vomiting, bloating, diarrhea, constipation, hematemesis, melena, hematochezia  or hemorrhoids. Genitourinary: Denies dysuria, frequency, urgency, nocturia, hesitancy, discharge, hematuria or flank pain. Musculoskeletal: Denies arthralgias, myalgias, stiffness, jt. swelling, pain, limping or strain/sprain.  Skin: Denies pruritus, rash, hives, warts, acne, eczema or change in skin lesion(s). Neuro: No weakness, tremor, incoordination, spasms, paresthesia or pain. Psychiatric: Denies confusion, memory loss or sensory loss. Endo: Denies change in weight, skin or hair change.  Heme/Lymph: No excessive bleeding, bruising or enlarged lymph nodes.  Physical Exam  BP 132/76   Pulse 79   Temp 97.9 F (36.6 C)   Resp 16   Ht 5\' 10"  (1.778 m)   Wt 243 lb 12.8 oz (110.6 kg)   SpO2 98%   BMI 34.98 kg/m   Appears  over nourished, well groomed  and in no distress.  Eyes: PERRLA, EOMs, conjunctiva no swelling or erythema. Sinuses: No frontal/maxillary tenderness ENT/Mouth: EAC's clear, TM's nl w/o erythema, bulging. Nares clear w/o erythema, swelling, exudates. Oropharynx clear without erythema or exudates. Oral hygiene is good. Tongue normal, non obstructing. Hearing intact.  Neck: Supple. Thyroid not palpable. Car 2+/2+ without bruits, nodes or JVD. Chest: Respirations nl with BS clear & equal w/o rales, rhonchi,  wheezing or stridor.  Cor: Heart sounds normal w/ regular rate and rhythm without sig. murmurs, gallops, clicks or rubs. Peripheral pulses normal and equal  without edema.  Abdomen: Soft & bowel sounds normal. Non-tender w/o guarding, rebound, hernias, masses or organomegaly.  Lymphatics: Unremarkable.  Musculoskeletal: Full ROM all peripheral extremities, joint stability, 5/5 strength and normal gait.  Skin: Warm, dry without exposed rashes, lesions or ecchymosis apparent.  Neuro: Cranial nerves intact, reflexes equal bilaterally.  Sensory-motor testing grossly intact. Tendon reflexes grossly intact.  Pysch: Alert & oriented x 3.  Insight and judgement nl & appropriate. No ideations.  Assessment and Plan:  1. Essential hypertension  - Continue medication, monitor blood pressure at home.  - Continue DASH diet.  Reminder to go to the ER if any CP,  SOB, nausea, dizziness, severe HA, changes vision/speech.     - CBC with Differential/Platelet - COMPLETE METABOLIC PANEL WITH GFR - Magnesium - TSH  2. Hyperlipidemia associated with type 2 diabetes mellitus ([place])  - Continue diet/meds, exercise,& lifestyle modifications.  - Continue monitor periodic cholesterol/liver & renal functions      - Lipid panel - TSH  3. Type 2 diabetes mellitus with stage 2 chronic kidney  disease, with long-term current use of insulin ([facility])  - Continue diet, exercise  - Lifestyle modifications.  - Monitor appropriate labs    - Hemoglobin A1c  4. Diabetic polyneuropathy associated with type 2 diabetes mellitus ([facility])  - Hemoglobin A1c  5. Vitamin D deficiency  - Continue supplementation.   - VITAMIN D 25 Hydroxy   6. Idiopathic gout  - Uric acid  7. Medication management  - CBC with Differential/Platelet - COMPLETE METABOLIC PANEL WITH GFR - Magnesium - Lipid panel - TSH - Hemoglobin A1c - VITAMIN D 25 Hydroxy  - Uric acid        Discussed  regular exercise, BP monitoring, weight  control to achieve/maintain BMI less than 25 and discussed med and SE's. Recommended labs to assess and monitor clinical status with further disposition pending results of labs.  I discussed the assessment and treatment plan with the patient. The patient was provided an opportunity to ask questions and all were answered. The patient agreed with the plan and demonstrated an understanding of the instructions.  I provided over 30 minutes of exam, counseling, chart review and  complex critical decision making.        The patient was advised to call back or seek an in-person evaluation if the symptoms worsen or if the condition fails to improve as anticipated.   Kirtland Bouchard, MD

## 2022-05-26 ENCOUNTER — Ambulatory Visit (INDEPENDENT_AMBULATORY_CARE_PROVIDER_SITE_OTHER): Payer: Managed Care, Other (non HMO) | Admitting: Internal Medicine

## 2022-05-26 ENCOUNTER — Encounter: Payer: Self-pay | Admitting: Internal Medicine

## 2022-05-26 VITALS — BP 132/76 | HR 79 | Temp 97.9°F | Resp 16 | Ht 70.0 in | Wt 243.8 lb

## 2022-05-26 DIAGNOSIS — E1122 Type 2 diabetes mellitus with diabetic chronic kidney disease: Secondary | ICD-10-CM | POA: Diagnosis not present

## 2022-05-26 DIAGNOSIS — E785 Hyperlipidemia, unspecified: Secondary | ICD-10-CM

## 2022-05-26 DIAGNOSIS — E1142 Type 2 diabetes mellitus with diabetic polyneuropathy: Secondary | ICD-10-CM | POA: Diagnosis not present

## 2022-05-26 DIAGNOSIS — E559 Vitamin D deficiency, unspecified: Secondary | ICD-10-CM

## 2022-05-26 DIAGNOSIS — I10 Essential (primary) hypertension: Secondary | ICD-10-CM

## 2022-05-26 DIAGNOSIS — M1000 Idiopathic gout, unspecified site: Secondary | ICD-10-CM | POA: Diagnosis not present

## 2022-05-26 DIAGNOSIS — Z794 Long term (current) use of insulin: Secondary | ICD-10-CM

## 2022-05-26 DIAGNOSIS — Z79899 Other long term (current) drug therapy: Secondary | ICD-10-CM | POA: Diagnosis not present

## 2022-05-26 DIAGNOSIS — E1169 Type 2 diabetes mellitus with other specified complication: Secondary | ICD-10-CM | POA: Diagnosis not present

## 2022-05-26 DIAGNOSIS — N182 Chronic kidney disease, stage 2 (mild): Secondary | ICD-10-CM

## 2022-05-26 NOTE — Patient Instructions (Signed)

## 2022-05-27 ENCOUNTER — Other Ambulatory Visit: Payer: Self-pay | Admitting: Internal Medicine

## 2022-05-27 LAB — COMPLETE METABOLIC PANEL WITH GFR
AG Ratio: 2 (calc) (ref 1.0–2.5)
ALT: 29 U/L (ref 9–46)
AST: 25 U/L (ref 10–35)
Albumin: 4.3 g/dL (ref 3.6–5.1)
Alkaline phosphatase (APISO): 50 U/L (ref 35–144)
BUN: 20 mg/dL (ref 7–25)
CO2: 30 mmol/L (ref 20–32)
Calcium: 10.6 mg/dL — ABNORMAL HIGH (ref 8.6–10.3)
Chloride: 103 mmol/L (ref 98–110)
Creat: 0.98 mg/dL (ref 0.70–1.30)
Globulin: 2.2 g/dL (calc) (ref 1.9–3.7)
Glucose, Bld: 218 mg/dL — ABNORMAL HIGH (ref 65–99)
Potassium: 5.1 mmol/L (ref 3.5–5.3)
Sodium: 140 mmol/L (ref 135–146)
Total Bilirubin: 0.4 mg/dL (ref 0.2–1.2)
Total Protein: 6.5 g/dL (ref 6.1–8.1)
eGFR: 91 mL/min/{1.73_m2} (ref >=60)

## 2022-05-27 LAB — LIPID PANEL
Cholesterol: 126 mg/dL (ref <200)
HDL: 24 mg/dL — ABNORMAL LOW (ref >=40)
LDL Cholesterol (Calc): 69 mg/dL (calc)
Non-HDL Cholesterol (Calc): 102 mg/dL (calc) (ref <130)
Total CHOL/HDL Ratio: 5.3 (calc) — ABNORMAL HIGH (ref <5.0)
Triglycerides: 241 mg/dL — ABNORMAL HIGH (ref <150)

## 2022-05-27 LAB — CBC WITH DIFFERENTIAL/PLATELET
Absolute Monocytes: 410 cells/uL (ref 200–950)
Basophils Absolute: 23 cells/uL (ref 0–200)
Basophils Relative: 0.4 %
Eosinophils Absolute: 279 cells/uL (ref 15–500)
Eosinophils Relative: 4.9 %
HCT: 42.2 % (ref 38.5–50.0)
Hemoglobin: 14.2 g/dL (ref 13.2–17.1)
Lymphs Abs: 2366 cells/uL (ref 850–3900)
MCH: 28.6 pg (ref 27.0–33.0)
MCHC: 33.6 g/dL (ref 32.0–36.0)
MCV: 84.9 fL (ref 80.0–100.0)
MPV: 10.5 fL (ref 7.5–12.5)
Monocytes Relative: 7.2 %
Neutro Abs: 2622 cells/uL (ref 1500–7800)
Neutrophils Relative %: 46 %
Platelets: 234 10*3/uL (ref 140–400)
RBC: 4.97 10*6/uL (ref 4.20–5.80)
RDW: 13.2 % (ref 11.0–15.0)
Total Lymphocyte: 41.5 %
WBC: 5.7 10*3/uL (ref 3.8–10.8)

## 2022-05-27 LAB — HEMOGLOBIN A1C
Hgb A1c MFr Bld: 7.3 % of total Hgb — ABNORMAL HIGH (ref <5.7)
Mean Plasma Glucose: 163 mg/dL
eAG (mmol/L): 9 mmol/L

## 2022-05-27 LAB — URIC ACID: Uric Acid, Serum: 6.1 mg/dL (ref 4.0–8.0)

## 2022-05-27 LAB — VITAMIN D 25 HYDROXY (VIT D DEFICIENCY, FRACTURES): Vit D, 25-Hydroxy: 54 ng/mL (ref 30–100)

## 2022-05-27 LAB — TSH: TSH: 2.56 mIU/L (ref 0.40–4.50)

## 2022-05-27 LAB — MAGNESIUM: Magnesium: 1.6 mg/dL (ref 1.5–2.5)

## 2022-05-27 NOTE — Progress Notes (Signed)
<><><><><><><><><><><><><><><><><><><><><><><><><><><><><><><><><> <><><><><><><><><><><><><><><><><><><><><><><><><><><><><><><><><> -   Test results slightly outside the reference range are not unusual. If there is anything important, I will review this with you,  otherwise it is considered normal test values.  If you have further questions,  please do not hesitate to contact me at the office or via My Chart.  <><><><><><><><><><><><><><><><><><><><><><><><><><><><><><><><><> <><><><><><><><><><><><><><><><><><><><><><><><><><><><><><><><><>  -  Glucose = 218 mg% - too high  - A1c = 7.3% - Still to high  - Ideal or goal is below 6.0%  <><><><><><><><><><><><><><><><><><><><><><><><><><><><><><><><><>  -   Magnesium  -   1.6   -  very  low- goal is betw 2.0 - 2.5,   - So..............Marland Kitchen  Recommend that you take 2 tablets of                                                                                        Magnesium 500 mg tablet daily   - also important to eat lots of  leafy green vegetables   - spinach - Kale - collards - greens - okra - asparagus  - broccoli - quinoa - squash - almonds   - black, red, white beans  -  peas - green beans <><><><><><><><><><><><><><><><><><><><><><><><><><><><><><><><><>  -  Uric acid /Gout  - test is normal & OK  <><><><><><><><><><><><><><><><><><><><><><><><><><><><><><><><><>  -  All Else - CBC - Kidneys - Electrolytes - Liver - Magnesium & Thyroid    - all  Normal / OK  <><><><><><><><><><><><><><><><><><><><><><><><><><><><><><><><><>

## 2022-06-29 ENCOUNTER — Other Ambulatory Visit: Payer: Self-pay | Admitting: Internal Medicine

## 2022-06-29 DIAGNOSIS — E119 Type 2 diabetes mellitus without complications: Secondary | ICD-10-CM

## 2022-07-09 ENCOUNTER — Encounter: Payer: Self-pay | Admitting: Internal Medicine

## 2022-08-28 ENCOUNTER — Encounter: Payer: Self-pay | Admitting: Internal Medicine

## 2022-10-07 NOTE — Progress Notes (Signed)
Annual  Screening/Preventative Visit  & Comprehensive Evaluation & Examination  Future Appointments  Date Time Provider Department  10/25/2021 11:00 AM Unk Pinto, MD [facility]  10/31/2022 11:00 AM Unk Pinto, MD [facility]            This very nice 57 y.o. [name] presents for a Screening /Preventative Visit & comprehensive evaluation and management of multiple medical co-morbidities.  Patient has been followed for HTN, HLD, T2_IDDM  and Vitamin D Deficiency.   Patient has prior hx/o Gout and stable off meds.       HTN predates circa 2004.   Patient's BP has been controlled at home.  Today's BP is  at goal  - 134/80 .   Patient denies any cardiac symptoms as chest pain, palpitations, shortness of breath, dizziness or ankle swelling.       Patient's hyperlipidemia is controlled with diet and Ezetimibe /Fenofibrate. Patient denies myalgias or other medication SE's. Last lipids were at goal except elevated Trig's :  Lab Results  Component Value Date   CHOL 126 05/26/2022   HDL 24 (L) 05/26/2022   LDLCALC 69 05/26/2022   TRIG 241 (H) 05/26/2022   CHOLHDL 5.3 (H) 05/26/2022         Patient has hx/o moderate Obesity (BMI 31+) and insulin requiring  T2_DM  (2004) w/CKD2  (GFR 87) .   In 2020 , patient was started on Insulin.  In Nov 2020 , he was started on Ozempic  ( wt 250# )  and now is on Trulicity.Patient denies reactive hypoglycemic symptoms, visual blurring, diabetic polys or paresthesias. Last A1c was not at goal :   Lab Results  Component Value Date   HGBA1C 7.3 (H) 05/26/2022     Wt Readings from Last 3 Encounters:  10/08/22 239 lb 12.8 oz (108.8 kg)  05/26/22 243 lb 12.8 oz (110.6 kg)  04/17/22 234 lb (106.1 kg)         Finally, patient has history of Vitamin D Deficiency ("15" /2009) and last vitamin D was at goal :   Lab Results  Component Value Date   VD25OH 54 05/26/2022       Current Outpatient Medications on File Prior to Visit  Medication  Sig   aspirin 81 MG tablet Take  daily.   atenolol  100 MG tablet Take  1 tablet  Daily    VITAMIN D 2000 u Take 4 capsulesdaily.    Cinnamon 500 MG capsule Take  4  times daily.   Vitamin B-12   2500 MCG SL Place 1 tablet under the tongue daily.   Dulaglutide (TRULICITY) 4.5 0000000  Inject 4.5 mg as directed once a week.   Ezetimibe  10 MG tablet Take 1 tablet Daily    fenofibrate 160 MG tablet Take 1 tablet Daily    glipiZIDE 5 MG tablet Take  1 tablet  3 x /day  with Meals    NOVOLIN 70/30 Take 35 u - am   &   30 u - pm   or as directed   ipratropium 0.06 % nasal spray Use 1 to 2 sprays each nostril 2 to 3 x /day as needed   metFORMIN XR 500 MG  Take 2 tablets 2 x /Day with Food    olmesartan 40 MG tablet Take 1/2 to 1 tablet  at night for BP   pregabalin  100 MG capsule Take  1 capsule  2 x /day  with Bkfst & Lunch  and 2 capsules  at Bedtime     traZODone (150 MG tablet 1/2-1 tablet at night as needed       Allergies  Allergen Reactions   Phentermine Other (See Comments)    Vision loss   Topamax [Topiramate] Other (See Comments)    Vision changes/loss of vision   Zocor [Simvastatin]     Headache   Zoloft [Sertraline Hcl]     dysphona     Past Medical History:  Diagnosis Date   Asthma    Diabetes mellitus without complication ([place])    Fatty liver disease, nonalcoholic    GERD (gastroesophageal reflux disease)    Gout    Hyperlipidemia    Hypertension    Hypogonadism male    Obesity    Venous insufficiency      Health Maintenance  Topic Date Due   COVID-19 Vaccine (1) Never done   Zoster Vaccines- Shingrix (1 of 2) Never done   FOOT EXAM  08/26/2021   HEMOGLOBIN A1C  12/31/2021   OPHTHALMOLOGY EXAM  01/02/2022   TETANUS/TDAP  02/10/2023   Fecal DNA (Cologuard)  09/20/2023   INFLUENZA VACCINE  Completed   Hepatitis C Screening  Completed   HIV Screening  Completed   HPV VACCINES  Aged Out     Immunization History  Administered Date(s) Administered    Influenza,inj,Quad PF,6+ Mos 07/03/2021   PPD Test 02/17/2014, 03/08/2015, 03/27/2016, 05/13/2017, 06/21/2018, 07/27/2019, 08/27/2020   Pneumococcal -23 07/27/2019   Pneumococcal -23 02/09/2013   Tdap 02/09/2013    Last Colon - 08/08/1995 - Dr Sarina Ser - Negative  Cologard - 09/19/2020 - Negative - Recc 3 year f/u due Feb 2025  Past Surgical History:  Procedure Laterality Date   ANTERIOR CRUCIATE LIGAMENT REPAIR Left 2001   [place], 2002   gastritis, esophagititis   KNEE ARTHROSCOPY Right 2002, 1995     Family History  Problem Relation Age of Onset   Diabetes Mother    Hypertension Mother    Gout Mother    COPD Father    Cancer Sister 40       appendix   Gout Brother    Hyperlipidemia Brother    Hypertension Brother    Hypertension Brother    Hyperlipidemia Brother    Gout Brother    Diabetes Brother    COPD Sister      Social History   Tobacco Use   Smoking status: Former    Packs/day: 1.00    Years: 15.00    Pack years: 15.00    Types: Cigarettes    Quit date: 08/07/2000    Years since quitting: 21.2   Smokeless tobacco: Never  Substance Use Topics   Alcohol use: Yes    Comment: social   Drug use: No      ROS Constitutional: Denies fever, chills, weight loss/gain, headaches, insomnia,  night sweats or change in appetite. Does c/o fatigue. Eyes: Denies redness, blurred vision, diplopia, discharge, itchy or watery eyes.  ENT: Denies discharge, congestion, post nasal drip, epistaxis, sore throat, earache, hearing loss, dental pain, Tinnitus, Vertigo, Sinus pain or snoring.  Cardio: Denies chest pain, palpitations, irregular heartbeat, syncope, dyspnea, diaphoresis, orthopnea, PND, claudication or edema Respiratory: denies cough, dyspnea, DOE, pleurisy, hoarseness, laryngitis or wheezing.  Gastrointestinal: Denies dysphagia, heartburn, reflux, water brash, pain, cramps, nausea, vomiting, bloating, diarrhea,  constipation, hematemesis, melena, hematochezia, jaundice or hemorrhoids Genitourinary: Denies dysuria, frequency,discharge, hematuria or flank pain. Has urgency, nocturia x 2-3 &  occasional hesitancy. Musculoskeletal: Denies arthralgia, myalgia, stiffness, Jt. Swelling, pain, limp or strain/sprain. Denies Falls. Skin: Denies puritis, rash, hives, warts, acne, eczema or change in skin lesion Neuro: No weakness, tremor, incoordination, spasms, paresthesia or pain Psychiatric: Denies confusion, memory loss or sensory loss. Denies Depression. Endocrine: Denies change in weight, skin, hair change, nocturia, and paresthesia, diabetic polys, visual blurring or hyper / hypo glycemic episodes.  Heme/Lymph: No excessive bleeding, bruising or enlarged lymph nodes.   Physical Exam  BP 134/80   Pulse 88   Temp 98 F (36.7 C)   Resp 16   Ht '5\' 10"'$  (1.778 m)   Wt 239 lb 12.8 oz (108.8 kg)   SpO2 98%   BMI 34.41 kg/m   General Appearance: Over nourished  and in no apparent distress.  Eyes: PERRLA, EOMs, conjunctiva no swelling or erythema, normal fundi and vessels. Sinuses: No frontal/maxillary tenderness ENT/Mouth: EACs patent / TMs  nl. Nares clear without erythema, swelling, mucoid exudates. Oral hygiene is good. No erythema, swelling, or exudate. Tongue normal, non-obstructing. Tonsils not swollen or erythematous. Hearing normal.  Neck: Supple, thyroid not palpable. No bruits, nodes or JVD. Respiratory: Respiratory effort normal.  BS equal and clear bilateral without rales, rhonci, wheezing or stridor. Cardio: Heart sounds are normal with regular rate and rhythm and no murmurs, rubs or gallops. Peripheral pulses are normal and equal bilaterally without edema. No aortic or femoral bruits. Chest: symmetric with normal excursions and percussion.  Abdomen: Soft, with Nl bowel sounds. Nontender, no guarding, rebound, hernias, masses, or organomegaly.  Lymphatics: Non tender without lymphadenopathy.   Musculoskeletal: Full ROM all peripheral extremities, joint stability, 5/5 strength, and normal gait. Skin: Warm and dry without rashes, lesions, cyanosis, clubbing or  ecchymosis.  Neuro: Cranial nerves intact, reflexes equal bilaterally. Normal muscle tone, no cerebellar symptoms. Sensation intact.  Pysch: Alert and oriented X 3 with normal affect, insight and judgment appropriate.   Assessment and Plan  1. Annual Preventative/Screening Exam    2. Essential hypertension  - EKG 12-Lead - [place], RETROPERITNL ABD,  LTD - Urinalysis, Routine w reflex microscopic - Microalbumin / creatinine urine ratio - CBC with Differential/Platelet - COMPLETE METABOLIC PANEL WITH GFR - Magnesium - TSH  3. Hyperlipidemia associated with type 2 diabetes mellitus ([place])  - EKG 12-Lead - [place], RETROPERITNL ABD,  LTD - Lipid panel - TSH  4. Type 2 diabetes mellitus with stage 2 chronic kidney  disease, with long-term current use of insulin (HCC)  - EKG 12-Lead - [place], RETROPERITNL ABD,  LTD - Urinalysis, Routine w reflex microscopic - Microalbumin / creatinine urine ratio - Hemoglobin A1c - Insulin, random - tirzepatide (MOUNJARO) 10 MG/0.5ML Pen;    5. Vitamin D deficiency  - VITAMIN D 25 Hydroxy   6. Diabetic polyneuropathy associated with type 2 diabetes mellitus (HCC)  - HM DIABETES FOOT EXAM - PR LOW EXTEMITY NEUR EXAM DOCUM - Hemoglobin A1c  7. Idiopathic gout  - Uric acid  8. Testosterone deficiency  - Testosterone  9. Benign localized prostatic hyperplasia with lower urinary tract symptoms (LUTS)  - tamsulosin (FLOMAX) 0.4 MG CAPS capsule;  Take 1 tablet at Bedtime for Prostate   Dispense: 90 capsule; Refill: 3  10. B12 deficiency  - Vitamin B12  11. Screening for colorectal cancer  - POC Hemoccult Bld/Stl   12. Screening for tuberculosis  - TB Skin Test  13. Prostate cancer screening  - PSA  14. Screening for heart disease  - EKG 12-Lead  15. FH:  hypertension  - EKG 12-Lead - [place], RETROPERITNL ABD,  LTD  16. Former smoker  - [place], RETROPERITNL ABD,  LTD  25. Screening for AAA (aortic abdominal aneurysm)  - [place], RETROPERITNL ABD,  LTD  18. Fatigue, unspecified type  - Iron, Total/Total Iron Binding Cap - Vitamin B12 - Testosterone - CBC with Differential/Platelet  19. Medication management  - Urinalysis, Routine w reflex microscopic - Microalbumin / creatinine urine ratio - Vitamin B12 - Testosterone - Uric acid - CBC with Differential/Platelet - COMPLETE METABOLIC PANEL WITH GFR - Magnesium - Lipid panel - TSH - Hemoglobin A1c - Insulin, random - VITAMIN D 25 Hydroxy         Patient was counseled in prudent diet, weight control to achieve/maintain BMI less than 25, BP monitoring, regular exercise and medications as discussed.  Discussed med effects and SE's. Routine screening labs and tests as requested with regular follow-up as recommended. Over 40 minutes of exam, counseling, chart review and high complex critical decision making was performed   Kirtland Bouchard, MD

## 2022-10-07 NOTE — Progress Notes (Incomplete)
Annual  Screening/Preventative Visit  & Comprehensive Evaluation & Examination  Future Appointments  Date Time Provider Department  10/25/2021 11:00 AM Unk Pinto, MD [facility]  10/31/2022 11:00 AM Unk Pinto, MD [facility]            This very nice 57 y.o. [name] presents for a Screening /Preventative Visit & comprehensive evaluation and management of multiple medical co-morbidities.  Patient has been followed for HTN, HLD, T2_IDDM  and Vitamin D Deficiency.   Patient has prior hx/o Gout and stable off meds.       HTN predates circa 2004.   Patient's BP has been controlled at home.  Today's BP is                              .   Patient denies any cardiac symptoms as chest pain, palpitations, shortness of breath, dizziness or ankle swelling.       Patient's hyperlipidemia is controlled with diet and Ezetimibe /Fenofibrate. Patient denies myalgias or other medication SE's. Last lipids were at goal except elevated Trig's :  Lab Results  Component Value Date   CHOL 126 05/26/2022   HDL 24 (L) 05/26/2022   LDLCALC 69 05/26/2022   TRIG 241 (H) 05/26/2022   CHOLHDL 5.3 (H) 05/26/2022         Patient has hx/o moderate Obesity (BMI 31+) and insulin requiring  T2_DM  (2004) w/CKD2  (GFR 87) .   In 2020 , patient was started on Insulin.  In Nov 2020 , he was started on Ozempic  ( wt 250# )  and now is on Trulicity.Patient denies reactive hypoglycemic symptoms, visual blurring, diabetic polys or paresthesias. Last A1c was not at goal :   Lab Results  Component Value Date   HGBA1C 7.3 (H) 05/26/2022     Wt Readings from Last 3 Encounters:  05/26/22 243 lb 12.8 oz (110.6 kg)  04/17/22 234 lb (106.1 kg)  02/20/22 245 lb (111.1 kg)         Finally, patient has history of Vitamin D Deficiency ("15" /2009) and last vitamin D was at goal :   Lab Results  Component Value Date   VD25OH 54 05/26/2022       Current Outpatient Medications on File Prior to Visit   Medication Sig  . aspirin 81 MG tablet Take  daily.  Marland Kitchen atenolol  100 MG tablet Take  1 tablet  Daily   . VITAMIN D 2000 u Take 4 capsulesdaily.   . Cinnamon 500 MG capsule Take  4  times daily.  . Vitamin B-12   2500 MCG SL Place 1 tablet under the tongue daily.  . Dulaglutide (TRULICITY) 4.5 0000000  Inject 4.5 mg as directed once a week.  . Ezetimibe  10 MG tablet Take 1 tablet Daily   . fenofibrate 160 MG tablet Take 1 tablet Daily   . glipiZIDE 5 MG tablet Take  1 tablet  3 x /day  with Meals   . NOVOLIN 70/30 Take 35 u - am   &   30 u - pm   or as directed  . ipratropium 0.06 % nasal spray Use 1 to 2 sprays each nostril 2 to 3 x /day as needed  . metFORMIN XR 500 MG  Take 2 tablets 2 x /Day with Food   . olmesartan 40 MG tablet Take 1/2 to 1 tablet  at night for BP  .  pregabalin  100 MG capsule Take  1 capsule  2 x /day  with Bkfst & Lunch  and 2 capsules  at Bedtime    . traZODone (150 MG tablet 1/2-1 tablet at night as needed       Allergies  Allergen Reactions  . Phentermine Other (See Comments)    Vision loss  . Topamax [Topiramate] Other (See Comments)    Vision changes/loss of vision  . Zocor [Simvastatin]     Headache  . Zoloft [Sertraline Hcl]     dysphona     Past Medical History:  Diagnosis Date  . Asthma   . Diabetes mellitus without complication ([place])   . Fatty liver disease, nonalcoholic   . GERD (gastroesophageal reflux disease)   . Gout   . Hyperlipidemia   . Hypertension   . Hypogonadism male   . Obesity   . Venous insufficiency      Health Maintenance  Topic Date Due  . COVID-19 Vaccine (1) Never done  . Zoster Vaccines- Shingrix (1 of 2) Never done  . FOOT EXAM  08/26/2021  . HEMOGLOBIN A1C  12/31/2021  . OPHTHALMOLOGY EXAM  01/02/2022  . TETANUS/TDAP  02/10/2023  . Fecal DNA (Cologuard)  09/20/2023  . INFLUENZA VACCINE  Completed  . Hepatitis C Screening  Completed  . HIV Screening  Completed  . HPV VACCINES  Aged Out      Immunization History  Administered Date(s) Administered  . Influenza,inj,Quad PF,6+ Mos 07/03/2021  . PPD Test 02/17/2014, 03/08/2015, 03/27/2016, 05/13/2017, 06/21/2018, 07/27/2019, 08/27/2020  . Pneumococcal -23 07/27/2019  . Pneumococcal -23 02/09/2013  . Tdap 02/09/2013    Last Colon - 08/08/1995 - Dr Sarina Ser - Negative  Cologard - 09/19/2020 - Negative - Recc 3 year f/u due Feb 2025  Past Surgical History:  Procedure Laterality Date  . ANTERIOR CRUCIATE LIGAMENT REPAIR Left 2001  . CYSTOSCOPY  1993  . ESOPHAGOGASTRODUODENOSCOPY  1995, 2002   gastritis, esophagititis  . KNEE ARTHROSCOPY Right 2002, 1995     Family History  Problem Relation Age of Onset  . Diabetes Mother   . Hypertension Mother   . Gout Mother   . COPD Father   . Cancer Sister 47       appendix  . Gout Brother   . Hyperlipidemia Brother   . Hypertension Brother   . Hypertension Brother   . Hyperlipidemia Brother   . Gout Brother   . Diabetes Brother   . COPD Sister      Social History   Tobacco Use  . Smoking status: Former    Packs/day: 1.00    Years: 15.00    Pack years: 15.00    Types: Cigarettes    Quit date: 08/07/2000    Years since quitting: 21.2  . Smokeless tobacco: Never  Substance Use Topics  . Alcohol use: Yes    Comment: social  . Drug use: No      ROS Constitutional: Denies fever, chills, weight loss/gain, headaches, insomnia,  night sweats or change in appetite. Does c/o fatigue. Eyes: Denies redness, blurred vision, diplopia, discharge, itchy or watery eyes.  ENT: Denies discharge, congestion, post nasal drip, epistaxis, sore throat, earache, hearing loss, dental pain, Tinnitus, Vertigo, Sinus pain or snoring.  Cardio: Denies chest pain, palpitations, irregular heartbeat, syncope, dyspnea, diaphoresis, orthopnea, PND, claudication or edema Respiratory: denies cough, dyspnea, DOE, pleurisy, hoarseness, laryngitis or wheezing.  Gastrointestinal: Denies  dysphagia, heartburn, reflux, water brash, pain, cramps, nausea, vomiting, bloating, diarrhea, constipation,  hematemesis, melena, hematochezia, jaundice or hemorrhoids Genitourinary: Denies dysuria, frequency, urgency, nocturia, hesitancy, discharge, hematuria or flank pain Musculoskeletal: Denies arthralgia, myalgia, stiffness, Jt. Swelling, pain, limp or strain/sprain. Denies Falls. Skin: Denies puritis, rash, hives, warts, acne, eczema or change in skin lesion Neuro: No weakness, tremor, incoordination, spasms, paresthesia or pain Psychiatric: Denies confusion, memory loss or sensory loss. Denies Depression. Endocrine: Denies change in weight, skin, hair change, nocturia, and paresthesia, diabetic polys, visual blurring or hyper / hypo glycemic episodes.  Heme/Lymph: No excessive bleeding, bruising or enlarged lymph nodes.   Physical Exam  There were no vitals taken for this visit.  General Appearance: Over nourished  and in no apparent distress.  Eyes: PERRLA, EOMs, conjunctiva no swelling or erythema, normal fundi and vessels. Sinuses: No frontal/maxillary tenderness ENT/Mouth: EACs patent / TMs  nl. Nares clear without erythema, swelling, mucoid exudates. Oral hygiene is good. No erythema, swelling, or exudate. Tongue normal, non-obstructing. Tonsils not swollen or erythematous. Hearing normal.  Neck: Supple, thyroid not palpable. No bruits, nodes or JVD. Respiratory: Respiratory effort normal.  BS equal and clear bilateral without rales, rhonci, wheezing or stridor. Cardio: Heart sounds are normal with regular rate and rhythm and no murmurs, rubs or gallops. Peripheral pulses are normal and equal bilaterally without edema. No aortic or femoral bruits. Chest: symmetric with normal excursions and percussion.  Abdomen: Soft, with Nl bowel sounds. Nontender, no guarding, rebound, hernias, masses, or organomegaly.  Lymphatics: Non tender without lymphadenopathy.  Musculoskeletal: Full ROM  all peripheral extremities, joint stability, 5/5 strength, and normal gait. Skin: Warm and dry without rashes, lesions, cyanosis, clubbing or  ecchymosis.  Neuro: Cranial nerves intact, reflexes equal bilaterally. Normal muscle tone, no cerebellar symptoms. Sensation intact.  Pysch: Alert and oriented X 3 with normal affect, insight and judgment appropriate.   Assessment and Plan  1. Annual Preventative/Screening Exam    2. Essential hypertension  - EKG 12-Lead - [place], RETROPERITNL ABD,  LTD - CBC with Differential/Platelet - COMPLETE METABOLIC PANEL WITH GFR - Magnesium - TSH  3. Hyperlipidemia associated with type 2 diabetes mellitus ([place])  - EKG 12-Lead - Lipid panel - TSH  4. Type 2 diabetes mellitus with stage 2 chronic kidney  disease, with long-term current use of insulin (HCC)  - EKG 12-Lead - [place], RETROPERITNL ABD,  LTD - Hemoglobin A1c  5. Vitamin D deficiency  - VITAMIN D 25 Hydroxy   6. Diabetic polyneuropathy associated with type 2 diabetes mellitus (HCC)  - HM DIABETES FOOT EXAM - PR LOW EXTEMITY NEUR EXAM DOCUM - Hemoglobin A1c  7. Idiopathic gout  - Uric acid  8. Testosterone deficiency  - Testosterone  9. B12 deficiency  - Vitamin B12  10. Screening for colorectal cancer  - POC Hemoccult Bld/Stl   11. Screening for tuberculosis  - TB Skin Test  12. Prostate cancer screening  - PSA  13. Screening for ischemic heart disease  - EKG 12-Lead  14. FH: hypertension  - EKG 12-Lead - [place], RETROPERITNL ABD,  LTD  15. Former smoker  - EKG 12-Lead - [place], RETROPERITNL ABD,  LTD  16. Screening for AAA (aortic abdominal aneurysm)  - [place], RETROPERITNL ABD,  LTD  17. Fatigue, unspecified type  - Iron, Total/Total Iron Binding Cap - Vitamin B12  18. Medication management  - Urinalysis, Routine w reflex microscopic - Urine Culture - CBC with Differential/Platelet - COMPLETE METABOLIC PANEL WITH GFR - Magnesium - Lipid panel -  TSH - Hemoglobin A1c - VITAMIN  D 25 Hydroxy          Patient was counseled in prudent diet, weight control to achieve/maintain BMI less than 25, BP monitoring, regular exercise and medications as discussed.  Discussed med effects and SE's. Routine screening labs and tests as requested with regular follow-up as recommended. Over 40 minutes of exam, counseling, chart review and high complex critical decision making was performed   Kirtland Bouchard, MD

## 2022-10-08 ENCOUNTER — Ambulatory Visit (INDEPENDENT_AMBULATORY_CARE_PROVIDER_SITE_OTHER): Payer: Managed Care, Other (non HMO) | Admitting: Internal Medicine

## 2022-10-08 ENCOUNTER — Encounter: Payer: Self-pay | Admitting: Internal Medicine

## 2022-10-08 VITALS — BP 134/80 | HR 88 | Temp 98.0°F | Resp 16 | Ht 70.0 in | Wt 239.8 lb

## 2022-10-08 DIAGNOSIS — Z87891 Personal history of nicotine dependence: Secondary | ICD-10-CM

## 2022-10-08 DIAGNOSIS — M1000 Idiopathic gout, unspecified site: Secondary | ICD-10-CM | POA: Diagnosis not present

## 2022-10-08 DIAGNOSIS — Z131 Encounter for screening for diabetes mellitus: Secondary | ICD-10-CM | POA: Diagnosis not present

## 2022-10-08 DIAGNOSIS — E559 Vitamin D deficiency, unspecified: Secondary | ICD-10-CM

## 2022-10-08 DIAGNOSIS — Z13220 Encounter for screening for lipoid disorders: Secondary | ICD-10-CM

## 2022-10-08 DIAGNOSIS — Z136 Encounter for screening for cardiovascular disorders: Secondary | ICD-10-CM | POA: Diagnosis not present

## 2022-10-08 DIAGNOSIS — Z130 Encounter for screening for diseases of the blood and blood-forming organs and certain disorders involving the immune mechanism: Secondary | ICD-10-CM

## 2022-10-08 DIAGNOSIS — Z125 Encounter for screening for malignant neoplasm of prostate: Secondary | ICD-10-CM | POA: Diagnosis not present

## 2022-10-08 DIAGNOSIS — E538 Deficiency of other specified B group vitamins: Secondary | ICD-10-CM

## 2022-10-08 DIAGNOSIS — Z79899 Other long term (current) drug therapy: Secondary | ICD-10-CM | POA: Diagnosis not present

## 2022-10-08 DIAGNOSIS — E1122 Type 2 diabetes mellitus with diabetic chronic kidney disease: Secondary | ICD-10-CM

## 2022-10-08 DIAGNOSIS — Z0001 Encounter for general adult medical examination with abnormal findings: Secondary | ICD-10-CM

## 2022-10-08 DIAGNOSIS — Z1329 Encounter for screening for other suspected endocrine disorder: Secondary | ICD-10-CM

## 2022-10-08 DIAGNOSIS — E1142 Type 2 diabetes mellitus with diabetic polyneuropathy: Secondary | ICD-10-CM

## 2022-10-08 DIAGNOSIS — E785 Hyperlipidemia, unspecified: Secondary | ICD-10-CM

## 2022-10-08 DIAGNOSIS — Z111 Encounter for screening for respiratory tuberculosis: Secondary | ICD-10-CM | POA: Diagnosis not present

## 2022-10-08 DIAGNOSIS — I700 Atherosclerosis of aorta: Secondary | ICD-10-CM | POA: Diagnosis not present

## 2022-10-08 DIAGNOSIS — Z1389 Encounter for screening for other disorder: Secondary | ICD-10-CM | POA: Diagnosis not present

## 2022-10-08 DIAGNOSIS — Z0000 Encounter for general adult medical examination without abnormal findings: Secondary | ICD-10-CM | POA: Diagnosis not present

## 2022-10-08 DIAGNOSIS — I10 Essential (primary) hypertension: Secondary | ICD-10-CM

## 2022-10-08 DIAGNOSIS — N401 Enlarged prostate with lower urinary tract symptoms: Secondary | ICD-10-CM

## 2022-10-08 DIAGNOSIS — Z8249 Family history of ischemic heart disease and other diseases of the circulatory system: Secondary | ICD-10-CM

## 2022-10-08 DIAGNOSIS — R350 Frequency of micturition: Secondary | ICD-10-CM

## 2022-10-08 DIAGNOSIS — R5383 Other fatigue: Secondary | ICD-10-CM

## 2022-10-08 DIAGNOSIS — Z1211 Encounter for screening for malignant neoplasm of colon: Secondary | ICD-10-CM

## 2022-10-08 DIAGNOSIS — E349 Endocrine disorder, unspecified: Secondary | ICD-10-CM

## 2022-10-08 MED ORDER — TIRZEPATIDE 10 MG/0.5ML ~~LOC~~ SOAJ
10.0000 mg | SUBCUTANEOUS | 3 refills | Status: DC
  Filled 2022-10-14: qty 2, 28d supply, fill #0
  Filled 2022-11-05 – 2022-11-13 (×2): qty 2, 28d supply, fill #1
  Filled 2022-12-09: qty 2, 28d supply, fill #2
  Filled 2023-01-05: qty 2, 28d supply, fill #3

## 2022-10-08 MED ORDER — TAMSULOSIN HCL 0.4 MG PO CAPS: ORAL_CAPSULE | ORAL | 3 refills | Status: AC

## 2022-10-08 NOTE — Patient Instructions (Signed)

## 2022-10-09 LAB — CBC WITH DIFFERENTIAL/PLATELET
Absolute Monocytes: 343 cells/uL (ref 200–950)
Basophils Absolute: 33 cells/uL (ref 0–200)
Basophils Relative: 0.5 %
Eosinophils Absolute: 330 cells/uL (ref 15–500)
Eosinophils Relative: 5 %
HCT: 45.2 % (ref 38.5–50.0)
Hemoglobin: 15.3 g/dL (ref 13.2–17.1)
Lymphs Abs: 2765 cells/uL (ref 850–3900)
MCH: 28 pg (ref 27.0–33.0)
MCHC: 33.8 g/dL (ref 32.0–36.0)
MCV: 82.8 fL (ref 80.0–100.0)
MPV: 10.2 fL (ref 7.5–12.5)
Monocytes Relative: 5.2 %
Neutro Abs: 3128 cells/uL (ref 1500–7800)
Neutrophils Relative %: 47.4 %
Platelets: 267 10*3/uL (ref 140–400)
RBC: 5.46 10*6/uL (ref 4.20–5.80)
RDW: 13.3 % (ref 11.0–15.0)
Total Lymphocyte: 41.9 %
WBC: 6.6 10*3/uL (ref 3.8–10.8)

## 2022-10-09 LAB — PSA: PSA: 0.15 ng/mL (ref <=4.00)

## 2022-10-09 LAB — COMPLETE METABOLIC PANEL WITH GFR
AG Ratio: 1.8 (calc) (ref 1.0–2.5)
ALT: 42 U/L (ref 9–46)
AST: 45 U/L — ABNORMAL HIGH (ref 10–35)
Albumin: 4.6 g/dL (ref 3.6–5.1)
Alkaline phosphatase (APISO): 49 U/L (ref 35–144)
BUN: 20 mg/dL (ref 7–25)
CO2: 26 mmol/L (ref 20–32)
Calcium: 10.4 mg/dL — ABNORMAL HIGH (ref 8.6–10.3)
Chloride: 104 mmol/L (ref 98–110)
Creat: 0.93 mg/dL (ref 0.70–1.30)
Globulin: 2.6 g/dL (calc) (ref 1.9–3.7)
Glucose, Bld: 124 mg/dL — ABNORMAL HIGH (ref 65–99)
Potassium: 4.4 mmol/L (ref 3.5–5.3)
Sodium: 140 mmol/L (ref 135–146)
Total Bilirubin: 0.6 mg/dL (ref 0.2–1.2)
Total Protein: 7.2 g/dL (ref 6.1–8.1)
eGFR: 96 mL/min/{1.73_m2} (ref >=60)

## 2022-10-09 LAB — LIPID PANEL
Cholesterol: 118 mg/dL (ref <200)
HDL: 21 mg/dL — ABNORMAL LOW (ref >=40)
LDL Cholesterol (Calc): 63 mg/dL (calc)
Non-HDL Cholesterol (Calc): 97 mg/dL (calc) (ref <130)
Total CHOL/HDL Ratio: 5.6 (calc) — ABNORMAL HIGH (ref <5.0)
Triglycerides: 289 mg/dL — ABNORMAL HIGH (ref <150)

## 2022-10-09 LAB — URINALYSIS, ROUTINE W REFLEX MICROSCOPIC
Bilirubin Urine: NEGATIVE
Glucose, UA: NEGATIVE
Hgb urine dipstick: NEGATIVE
Ketones, ur: NEGATIVE
Leukocytes,Ua: NEGATIVE
Nitrite: NEGATIVE
Protein, ur: NEGATIVE
Specific Gravity, Urine: 1.016 (ref 1.001–1.035)
pH: 5.5 (ref 5.0–8.0)

## 2022-10-09 LAB — IRON, TOTAL/TOTAL IRON BINDING CAP
%SAT: 22 % (calc) (ref 20–48)
Iron: 98 ug/dL (ref 50–180)
TIBC: 448 mcg/dL (calc) — ABNORMAL HIGH (ref 250–425)

## 2022-10-09 LAB — MICROALBUMIN / CREATININE URINE RATIO
Creatinine, Urine: 93 mg/dL (ref 20–320)
Microalb Creat Ratio: 6 mcg/mg creat (ref <30)
Microalb, Ur: 0.6 mg/dL

## 2022-10-09 LAB — MAGNESIUM: Magnesium: 1.7 mg/dL (ref 1.5–2.5)

## 2022-10-09 LAB — VITAMIN D 25 HYDROXY (VIT D DEFICIENCY, FRACTURES): Vit D, 25-Hydroxy: 72 ng/mL (ref 30–100)

## 2022-10-09 LAB — HEMOGLOBIN A1C
Hgb A1c MFr Bld: 7.9 % of total Hgb — ABNORMAL HIGH (ref <5.7)
Mean Plasma Glucose: 180 mg/dL
eAG (mmol/L): 10 mmol/L

## 2022-10-09 LAB — TESTOSTERONE: Testosterone: 192 ng/dL — ABNORMAL LOW (ref 250–827)

## 2022-10-09 LAB — VITAMIN B12: Vitamin B-12: 422 pg/mL (ref 200–1100)

## 2022-10-09 LAB — TSH: TSH: 2.41 mIU/L (ref 0.40–4.50)

## 2022-10-09 LAB — URIC ACID: Uric Acid, Serum: 7 mg/dL (ref 4.0–8.0)

## 2022-10-09 LAB — INSULIN, RANDOM: Insulin: 57.1 u[IU]/mL — ABNORMAL HIGH

## 2022-10-09 MED ORDER — DEXAMETHASONE 4 MG PO TABS: ORAL_TABLET | ORAL | 0 refills | Status: DC

## 2022-10-09 NOTE — Progress Notes (Signed)
<><><><><><><><><><><><><><><><><><><><><><><><><><><><><><><><><> <><><><><><><><><><><><><><><><><><><><><><><><><><><><><><><><><> - Test results slightly outside the reference range are not unusual. If there is anything important, I will review this with you,  otherwise it is considered normal test values.  If you have further questions,  please do not hesitate to contact me at the office or via My Chart.  <><><><><><><><><><><><><><><><><><><><><><><><><><><><><><><><><> <><><><><><><><><><><><><><><><><><><><><><><><><><><><><><><><><>  -  Testosterone is still low - Recommend take Zinc 50 mg tab Daily to help raise Testosterone level naturally   Also -    -  Exercise 20-30 minutes  2 x /day will help raise Testosterone levels  -  Losing weight helps raise Testosterone levels as                                     fat tissue metabolizes Testosterone into Estrogen !   <><><><><><><><><><><><><><><><><><><><><><><><><><><><><><><><><> <><><><><><><><><><><><><><><><><><><><><><><><><><><><><><><><><>  -Chol = 118   & LDL Chol = 63  - Both  Excellent !  - Very low risk for Heart Attack  / Stroke <><><><><><><><><><><><><><><><><><><><><><><><><><><><><><><><><> <><><><><><><><><><><><><><><><><><><><><><><><><><><><><><><><><>  -  But Triglycerides ( = 289  ) or fats in blood are STILL too high                 (   Ideal or  Goal is less than 150  !  )    - Recommend avoid fried & greasy foods,  sweets / candy,   - Avoid white rice  (brown or wild rice or Quinoa is OK),   - Avoid white potatoes  (sweet potatoes are OK)   - Avoid anything made from white flour  - bagels, doughnuts, rolls, buns, biscuits, white and   wheat breads, pizza crust and traditional  pasta made of white flour & egg white  - (vegetarian pasta or spinach or wheat pasta is OK).    - Multi-grain bread is OK - like multi-grain flat bread or  sandwich thins.   - Avoid alcohol in excess.   -  Exercise is also important.  <><><><><><><><><><><><><><><><><><><><><><><><><><><><><><><><><> <><><><><><><><><><><><><><><><><><><><><><><><><><><><><><><><><>  -  A1c = 7.9% - Still too high   ( Goal is less than 6.0%  ! )   -   Continue the Mounjaro to help with weight loss & work harder with Diet  <><><><><><><><><><><><><><><><><><><><><><><><><><><><><><><><><> <><><><><><><><><><><><><><><><><><><><><><><><><><><><><><><><><>  -   -  Vitamin B12 =  422  is still    Very Low  (Ideal or Goal Vit B12 is between 450 - 1,100)   Low Vit B12 may be associated with Anemia , Fatigue,   Peripheral Neuropathy, Dementia, "Brain Fog", & Depression  - Recommend take a sub-lingual form of Vitamin B12 tablet   1,000 to 5,000 mcg tab that you dissolve under your tongue /Daily   - Can get Baron Sane - best price at [employer] or on [employer]  <><><><><><><><><><><><><><><><><><><><><><><><><><><><><><><><><> <><><><><><><><><><><><><><><><><><><><><><><><><><><><><><><><><>  -  PSA is low -  No Prostate Cancer  - Great  !  <><><><><><><><><><><><><><><><><><><><><><><><><><><><><><><><><> <><><><><><><><><><><><><><><><><><><><><><><><><><><><><><><><><>  -  Uric Acid  / Gout Test is OK  <><><><><><><><><><><><><><><><><><><><><><><><><><><><><><><><><> <><><><><><><><><><><><><><><><><><><><><><><><><><><><><><><><><>  -   Magnesium = 1.7   - is   STILL  very  low- goal is betw 2.0 - 2.5,   - So.......Marland Kitchen  Recommend that you take Magnesium 500 mg tablet  3 x /day with Meals    - also important to eat lots of  leafy green vegetables   - spinach - Kale - collards - greens - okra - asparagus - broccoli - quinoa -  squash - almonds   - black, red, white beans  -  peas - green beans  <><><><><><><><><><><><><><><><><><><><><><><><><><><><><><><><><> <><><><><><><><><><><><><><><><><><><><><><><><><><><><><><><><><>  -  Vitamin D = 72 - Great - Please keep dose same                                                        -

## 2022-10-09 NOTE — Addendum Note (Signed)
Addended by: Unk Pinto on: 10/09/2022 12:49 PM   Modules accepted: Orders

## 2022-10-14 ENCOUNTER — Other Ambulatory Visit (HOSPITAL_COMMUNITY): Payer: Self-pay

## 2022-10-14 ENCOUNTER — Other Ambulatory Visit: Payer: Self-pay

## 2022-10-29 ENCOUNTER — Telehealth: Payer: Self-pay | Admitting: Internal Medicine

## 2022-10-29 ENCOUNTER — Other Ambulatory Visit: Payer: Self-pay | Admitting: Internal Medicine

## 2022-10-29 MED ORDER — PANTOPRAZOLE SODIUM 40 MG PO TBEC: DELAYED_RELEASE_TABLET | ORAL | 0 refills | Status: DC

## 2022-10-29 MED ORDER — ONDANSETRON HCL 8 MG PO TABS: ORAL_TABLET | ORAL | 0 refills | Status: DC

## 2022-10-29 NOTE — Telephone Encounter (Signed)
Pt has had increase in heartburn and indigestion since starting on new dose of mounjaro. He wants to know if there is anything we can send in to help with that.

## 2022-10-31 ENCOUNTER — Encounter: Payer: Managed Care, Other (non HMO) | Admitting: Internal Medicine

## 2022-11-06 ENCOUNTER — Other Ambulatory Visit (HOSPITAL_COMMUNITY): Payer: Self-pay

## 2022-11-13 ENCOUNTER — Other Ambulatory Visit (HOSPITAL_COMMUNITY): Payer: Self-pay

## 2023-01-09 ENCOUNTER — Other Ambulatory Visit (HOSPITAL_COMMUNITY): Payer: Self-pay

## 2023-01-12 ENCOUNTER — Other Ambulatory Visit: Payer: Self-pay | Admitting: Internal Medicine

## 2023-01-12 ENCOUNTER — Other Ambulatory Visit (HOSPITAL_COMMUNITY): Payer: Self-pay

## 2023-01-12 MED ORDER — TIRZEPATIDE 7.5 MG/0.5ML ~~LOC~~ SOAJ
SUBCUTANEOUS | 0 refills | Status: DC
  Filled 2023-01-12: qty 2, 28d supply, fill #0
  Filled 2023-02-04 – 2023-02-12 (×2): qty 2, 28d supply, fill #1
  Filled 2023-03-06: qty 2, 28d supply, fill #2

## 2023-01-13 ENCOUNTER — Other Ambulatory Visit (HOSPITAL_COMMUNITY): Payer: Self-pay

## 2023-01-13 ENCOUNTER — Other Ambulatory Visit: Payer: Self-pay

## 2023-01-21 ENCOUNTER — Encounter: Payer: Self-pay | Admitting: Nurse Practitioner

## 2023-01-21 ENCOUNTER — Ambulatory Visit: Payer: Managed Care, Other (non HMO) | Admitting: Nurse Practitioner

## 2023-01-21 VITALS — BP 130/80 | HR 76 | Temp 97.2°F | Ht 70.0 in | Wt 227.2 lb

## 2023-01-21 DIAGNOSIS — Z794 Long term (current) use of insulin: Secondary | ICD-10-CM | POA: Diagnosis not present

## 2023-01-21 DIAGNOSIS — E1142 Type 2 diabetes mellitus with diabetic polyneuropathy: Secondary | ICD-10-CM

## 2023-01-21 DIAGNOSIS — Z79899 Other long term (current) drug therapy: Secondary | ICD-10-CM

## 2023-01-21 DIAGNOSIS — M1000 Idiopathic gout, unspecified site: Secondary | ICD-10-CM

## 2023-01-21 DIAGNOSIS — E559 Vitamin D deficiency, unspecified: Secondary | ICD-10-CM

## 2023-01-21 DIAGNOSIS — E669 Obesity, unspecified: Secondary | ICD-10-CM

## 2023-01-21 DIAGNOSIS — I10 Essential (primary) hypertension: Secondary | ICD-10-CM

## 2023-01-21 DIAGNOSIS — E785 Hyperlipidemia, unspecified: Secondary | ICD-10-CM

## 2023-01-21 DIAGNOSIS — E1122 Type 2 diabetes mellitus with diabetic chronic kidney disease: Secondary | ICD-10-CM

## 2023-01-21 DIAGNOSIS — N182 Chronic kidney disease, stage 2 (mild): Secondary | ICD-10-CM

## 2023-01-21 DIAGNOSIS — J4520 Mild intermittent asthma, uncomplicated: Secondary | ICD-10-CM

## 2023-01-21 DIAGNOSIS — E66811 Obesity, class 1: Secondary | ICD-10-CM

## 2023-01-21 DIAGNOSIS — K2100 Gastro-esophageal reflux disease with esophagitis, without bleeding: Secondary | ICD-10-CM

## 2023-01-21 DIAGNOSIS — E1169 Type 2 diabetes mellitus with other specified complication: Secondary | ICD-10-CM

## 2023-01-21 NOTE — Patient Instructions (Signed)

## 2023-01-21 NOTE — Progress Notes (Signed)
FOLLOW UP  Assessment and Plan:   Hypertension Well controlled with current medications  Monitor blood pressure at home; patient to call if consistently greater than 130/80 Continue DASH diet.   Reminder to go to the ER if any CP, SOB, nausea, dizziness, severe HA, changes vision/speech, left arm numbness and tingling and jaw pain.  Cholesterol Currently at goal;  Continue low cholesterol diet and exercise.  Check lipid panel.   Diabetes/Polyneuropathy Continue medication - Glipizide, Metformin, Insulin PRN Continue Mounjaro  Continue diet and exercise.  Perform daily foot/skin check, notify office of any concerning changes.  Monitor and check A1C  Obesity with co morbidities Weight trending down. Discussed appropriate BMI Diet modification. Physical activity. Encouraged/praised to build confidence.  Vitamin D deficiency Recently at goal.  Continue supplement for goal of 60-100 Monitor Vitamin D levels  Medication Management All medications discussed and reviewed in full. All questions and concerns regarding medications addressed.    GERD Continue Prevacid PRN. No suspected reflux complications (Barret/stricture). Lifestyle modification:  wt loss, avoid meals 2-3h before bedtime. Consider eliminating food triggers:  chocolate, caffeine, EtOH, acid/spicy food.  Idiopathic gout No recent flare  Monitor Uric Acid levels Discussed low purine diet   Asthma Resolved - no longer need inhaler  Orders Placed This Encounter  Procedures   CBC with Differential/Platelet   COMPLETE METABOLIC PANEL WITH GFR   Lipid panel   Hemoglobin A1c    Notify office for further evaluation and treatment, questions or concerns if any reported s/s fail to improve.   The patient was advised to call back or seek an in-person evaluation if any symptoms worsen or if the condition fails to improve as anticipated.   Further disposition pending results of labs. Discussed med's effects and  SE's.    I discussed the assessment and treatment plan with the patient. The patient was provided an opportunity to ask questions and all were answered. The patient agreed with the plan and demonstrated an understanding of the instructions.  Discussed med's effects and SE's. Screening labs and tests as requested with regular follow-up as recommended.  I provided 25 minutes of face-to-face time during this encounter including counseling, chart review, and critical decision making was preformed.  Today's Plan of Care is based on a patient-centered health care approach known as shared decision making - the decisions, tests and treatments allow for patient preferences and values to be balanced with clinical evidence.     Future Appointments  Date Time Provider Department Center  04/29/2023  4:00 PM [name], MD [facility] None  08/03/2023  9:30 AM [name], NP [facility] None  11/02/2023  3:00 PM [name], MD [facility] None    ----------------------------------------------------------------------------------------------------------------------  HPI 57 y.o. male  presents for 3 month follow up on hypertension, cholesterol, diabetes, weight and vitamin D deficiency.   Overall he reports feeling well today.    BMI is Body mass index is 32.6 kg/m., he has not been working on diet and exercise. Wt Readings from Last 3 Encounters:  01/21/23 227 lb 3.2 oz (103.1 kg)  10/08/22 239 lb 12.8 oz (108.8 kg)  05/26/22 243 lb 12.8 oz (110.6 kg)   His blood pressure has been controlled at home, today their BP is BP: 130/80  He does not workout. He denies chest pain, shortness of breath, dizziness.   He is on cholesterol medication Zetia and denies myalgias. His cholesterol is at goal. The cholesterol last visit was:   Lab Results  Component Value Date  CHOL 118 10/08/2022   HDL 21 (L) 10/08/2022   LDLCALC 63 10/08/2022   TRIG 289 (H) 10/08/2022   CHOLHDL 5.6 (H)  10/08/2022    He has been working on diet and exercise for prediabetes, and denies polydipsia.  Now only taking Metformin, Glipizide and Insulin PRN.  Continues Mounjar 7.5 mg weekly. Last A1C in the office was:  Lab Results  Component Value Date   HGBA1C 7.9 (H) 10/08/2022   Patient is on Vitamin D supplement.   Lab Results  Component Value Date   VD25OH 72 10/08/2022        Current Medications:  Current Outpatient Medications on File Prior to Visit  Medication Sig   aspirin 81 MG tablet Take 81 mg by mouth daily.   Blood Glucose Monitoring Suppl (CONTOUR NEXT MONITOR) w/Device KIT 1 Device by Does not apply route 3 (three) times daily.   Cholecalciferol (VITAMIN D) 2000 units CAPS Take 4 capsules by mouth daily.    Cinnamon 500 MG capsule Take 500 mg by mouth 4 (four) times daily.   ezetimibe (ZETIA) 10 MG tablet Take 1 tablet Daily for Cholesterol   glipiZIDE (GLUCOTROL) 5 MG tablet Take  1 tablet  3 x /day  with Meals for Diabetes / (Patient taking differently: as needed. Take  1 tablet  3 x /day  with Meals for Diabetes /)   glucose blood (CONTOUR NEXT TEST) test strip Test blood sugar  three times a day before meals   metFORMIN (GLUCOPHAGE-XR) 500 MG 24 hr tablet Take  2 tablets   2 x /day  with Meals  for Diabetes                                      /                     TAKE                              BY                                MOUTH (Patient taking differently: as needed. Take  2 tablets   2 x /day  with Meals  for Diabetes                                      /                     TAKE                              BY                                MOUTH)   MICROLET LANCETS MISC USE TO TEST UP TO THREE TIMES DAILY AS DIRECTED   olmesartan (BENICAR) 40 MG tablet Take 1/2 to 1 tablet  at night for BP   ondansetron (ZOFRAN) 8 MG tablet Take 1 tablet 2 to 3 x /day for Nausea   pantoprazole (PROTONIX) 40 MG tablet Take  1  tablet   1 to 2 x /day  to Prevent Heartburn &   Indigestion   tadalafil (CIALIS) 20 MG tablet Take  1/2 to 1 tablet  every 2 to 3 days  as needed for  XXXX   tamsulosin (FLOMAX) 0.4 MG CAPS capsule Take 1 tablet at Bedtime for Prostate   tirzepatide (MOUNJARO) 7.5 MG/0.5ML Pen Inject  7.5 mg  into skin  every 7 days  for Diabetes  (Dx:  e11.29)   insulin NPH-regular Human (NOVOLIN 70/30) (70-30) 100 UNIT/ML injection Take 35 u - am   &   30 u - pm   or as directed (Patient not taking: Reported on 01/21/2023)   No current facility-administered medications on file prior to visit.     Allergies:  Allergies  Allergen Reactions   Phentermine Other (See Comments)    Vision loss   Topamax [Topiramate] Other (See Comments)    Vision changes/loss of vision   Zocor [Simvastatin]     Headache   Zoloft [Sertraline Hcl]     dysphona     Medical History:  Past Medical History:  Diagnosis Date   Asthma    Diabetes mellitus without complication (HCC)    Fatty liver disease, nonalcoholic    GERD (gastroesophageal reflux disease)    Gout    Hyperlipidemia    Hypertension    Hypogonadism male    Obesity    Venous insufficiency    Family history- Reviewed and unchanged Social history- Reviewed and unchanged   Review of Systems: A complete ROS was performed with pertinent positives/negatives noted in the HPI. The remainder of the ROS are negative.  Physical Exam: BP 130/80   Pulse 76   Temp (!) 97.2 F (36.2 C)   Ht 5\' 10"  (1.778 m)   Wt 227 lb 3.2 oz (103.1 kg)   SpO2 99%   BMI 32.60 kg/m  Wt Readings from Last 3 Encounters:  01/21/23 227 lb 3.2 oz (103.1 kg)  10/08/22 239 lb 12.8 oz (108.8 kg)  05/26/22 243 lb 12.8 oz (110.6 kg)   General Appearance: Well nourished, in no apparent distress. Eyes: PERRLA, EOMs, conjunctiva no swelling or erythema Sinuses: No Frontal/maxillary tenderness ENT/Mouth: Ext aud canals clear, TMs without erythema, bulging. No erythema, swelling, or exudate on post pharynx.  Tonsils not swollen or  erythematous. Hearing normal.  Neck: Supple, thyroid normal.  Respiratory: Respiratory effort normal, BS equal bilaterally without rales, rhonchi, wheezing or stridor.  Cardio: RRR with no MRGs. Brisk peripheral pulses without edema.  Abdomen: Soft, + BS.  Non tender, no guarding, rebound, hernias, masses. Lymphatics: Non tender without lymphadenopathy.  Musculoskeletal: Full ROM, 5/5 strength, Normal gait Skin: Warm, dry without rashes, lesions, ecchymosis.  Neuro: Cranial nerves intact. No cerebellar symptoms.  Psych: Awake and oriented X 3, normal affect, Insight and Judgment appropriate.    [name], NP 4:12 PM [facility]

## 2023-01-22 LAB — CBC WITH DIFFERENTIAL/PLATELET
Absolute Monocytes: 393 cells/uL (ref 200–950)
Basophils Absolute: 31 cells/uL (ref 0–200)
Basophils Relative: 0.6 %
Eosinophils Absolute: 403 cells/uL (ref 15–500)
Eosinophils Relative: 7.9 %
HCT: 40 % (ref 38.5–50.0)
Hemoglobin: 13.6 g/dL (ref 13.2–17.1)
Lymphs Abs: 2392 cells/uL (ref 850–3900)
MCH: 28.3 pg (ref 27.0–33.0)
MCHC: 34 g/dL (ref 32.0–36.0)
MCV: 83.3 fL (ref 80.0–100.0)
MPV: 10.3 fL (ref 7.5–12.5)
Monocytes Relative: 7.7 %
Neutro Abs: 1882 cells/uL (ref 1500–7800)
Neutrophils Relative %: 36.9 %
Platelets: 219 10*3/uL (ref 140–400)
RBC: 4.8 10*6/uL (ref 4.20–5.80)
RDW: 13.6 % (ref 11.0–15.0)
Total Lymphocyte: 46.9 %
WBC: 5.1 10*3/uL (ref 3.8–10.8)

## 2023-01-22 LAB — LIPID PANEL
Cholesterol: 110 mg/dL (ref <200)
HDL: 26 mg/dL — ABNORMAL LOW (ref >=40)
LDL Cholesterol (Calc): 58 mg/dL (calc)
Non-HDL Cholesterol (Calc): 84 mg/dL (calc) (ref <130)
Total CHOL/HDL Ratio: 4.2 (calc) (ref <5.0)
Triglycerides: 190 mg/dL — ABNORMAL HIGH (ref <150)

## 2023-01-22 LAB — COMPLETE METABOLIC PANEL WITH GFR
AG Ratio: 2.1 (calc) (ref 1.0–2.5)
ALT: 28 U/L (ref 9–46)
AST: 26 U/L (ref 10–35)
Albumin: 4.4 g/dL (ref 3.6–5.1)
Alkaline phosphatase (APISO): 36 U/L (ref 35–144)
BUN: 14 mg/dL (ref 7–25)
CO2: 28 mmol/L (ref 20–32)
Calcium: 10.4 mg/dL — ABNORMAL HIGH (ref 8.6–10.3)
Chloride: 106 mmol/L (ref 98–110)
Creat: 0.96 mg/dL (ref 0.70–1.30)
Globulin: 2.1 g/dL (calc) (ref 1.9–3.7)
Glucose, Bld: 53 mg/dL — ABNORMAL LOW (ref 65–99)
Potassium: 4.7 mmol/L (ref 3.5–5.3)
Sodium: 141 mmol/L (ref 135–146)
Total Bilirubin: 0.5 mg/dL (ref 0.2–1.2)
Total Protein: 6.5 g/dL (ref 6.1–8.1)
eGFR: 92 mL/min/{1.73_m2} (ref >=60)

## 2023-01-22 LAB — HEMOGLOBIN A1C
Hgb A1c MFr Bld: 6.2 % of total Hgb — ABNORMAL HIGH (ref <5.7)
Mean Plasma Glucose: 131 mg/dL
eAG (mmol/L): 7.3 mmol/L

## 2023-01-26 ENCOUNTER — Other Ambulatory Visit: Payer: Self-pay | Admitting: Internal Medicine

## 2023-01-26 DIAGNOSIS — E782 Mixed hyperlipidemia: Secondary | ICD-10-CM

## 2023-01-26 DIAGNOSIS — I10 Essential (primary) hypertension: Secondary | ICD-10-CM

## 2023-01-28 ENCOUNTER — Other Ambulatory Visit (HOSPITAL_COMMUNITY): Payer: Self-pay

## 2023-01-29 ENCOUNTER — Other Ambulatory Visit: Payer: Self-pay | Admitting: Nurse Practitioner

## 2023-02-12 ENCOUNTER — Other Ambulatory Visit (HOSPITAL_COMMUNITY): Payer: Self-pay

## 2023-03-06 ENCOUNTER — Other Ambulatory Visit: Payer: Self-pay

## 2023-03-09 ENCOUNTER — Other Ambulatory Visit (HOSPITAL_COMMUNITY): Payer: Self-pay

## 2023-03-10 ENCOUNTER — Other Ambulatory Visit: Payer: Self-pay | Admitting: Internal Medicine

## 2023-03-10 MED ORDER — PANTOPRAZOLE SODIUM 40 MG PO TBEC: DELAYED_RELEASE_TABLET | ORAL | 3 refills | Status: DC

## 2023-03-30 ENCOUNTER — Encounter: Payer: Self-pay | Admitting: Internal Medicine

## 2023-03-30 ENCOUNTER — Other Ambulatory Visit: Payer: Self-pay | Admitting: Internal Medicine

## 2023-03-30 DIAGNOSIS — E1122 Type 2 diabetes mellitus with diabetic chronic kidney disease: Secondary | ICD-10-CM

## 2023-03-30 MED ORDER — TIRZEPATIDE 10 MG/0.5ML ~~LOC~~ SOAJ
SUBCUTANEOUS | 3 refills | Status: DC
Start: 2023-03-30 — End: 2023-12-21
  Filled 2023-03-30: qty 2, 28d supply, fill #0
  Filled 2023-04-01: qty 6, 84d supply, fill #0
  Filled 2023-06-19: qty 6, 84d supply, fill #1
  Filled 2023-09-11: qty 6, 84d supply, fill #2
  Filled 2023-12-04 – 2023-12-18 (×3): qty 6, 84d supply, fill #3

## 2023-03-31 ENCOUNTER — Other Ambulatory Visit: Payer: Self-pay

## 2023-04-01 ENCOUNTER — Other Ambulatory Visit (HOSPITAL_COMMUNITY): Payer: Self-pay

## 2023-04-23 ENCOUNTER — Ambulatory Visit (INDEPENDENT_AMBULATORY_CARE_PROVIDER_SITE_OTHER): Payer: Managed Care, Other (non HMO) | Admitting: Internal Medicine

## 2023-04-23 ENCOUNTER — Encounter: Payer: Self-pay | Admitting: Internal Medicine

## 2023-04-23 VITALS — BP 132/76 | HR 88 | Temp 97.9°F | Resp 17 | Ht 70.0 in | Wt 225.4 lb

## 2023-04-23 DIAGNOSIS — E559 Vitamin D deficiency, unspecified: Secondary | ICD-10-CM | POA: Diagnosis not present

## 2023-04-23 DIAGNOSIS — E785 Hyperlipidemia, unspecified: Secondary | ICD-10-CM | POA: Diagnosis not present

## 2023-04-23 DIAGNOSIS — E1169 Type 2 diabetes mellitus with other specified complication: Secondary | ICD-10-CM | POA: Diagnosis not present

## 2023-04-23 DIAGNOSIS — E1122 Type 2 diabetes mellitus with diabetic chronic kidney disease: Secondary | ICD-10-CM | POA: Diagnosis not present

## 2023-04-23 DIAGNOSIS — Z79899 Other long term (current) drug therapy: Secondary | ICD-10-CM | POA: Diagnosis not present

## 2023-04-23 DIAGNOSIS — E1142 Type 2 diabetes mellitus with diabetic polyneuropathy: Secondary | ICD-10-CM

## 2023-04-23 DIAGNOSIS — M1000 Idiopathic gout, unspecified site: Secondary | ICD-10-CM

## 2023-04-23 DIAGNOSIS — I10 Essential (primary) hypertension: Secondary | ICD-10-CM | POA: Diagnosis not present

## 2023-04-23 DIAGNOSIS — Z794 Long term (current) use of insulin: Secondary | ICD-10-CM

## 2023-04-23 DIAGNOSIS — N182 Chronic kidney disease, stage 2 (mild): Secondary | ICD-10-CM

## 2023-04-23 NOTE — Progress Notes (Signed)
Future Appointments  Date Time Provider Department  04/23/2023                6 mo   4:00 PM [name], MD [facility]  08/03/2023              9 mo   9:30 AM [name], NP [facility]  11/02/2023                cpe  3:00 PM [name], MD [facility]    History of Present Illness:      This very nice 57 y.o. [name] with HTN, HLD, Insulin requiting T2_DM and Vitamin D Deficiency  presents for 6  month follow up. Patient also has hx/o Gout, but is off of meds.         Patient's HTN predates circa 2004.   Patient's BP has been controlled at home.  Today's BP was initially elevated & rechecked at goal - 132/76.  Patient has had no complaints of any cardiac type chest pain, palpitations, dyspnea /orthopnea/ PND, dizziness, claudication or dependent edema.        Hyperlipidemia is controlled with diet & meds. Patient denies myalgias or other med SE's. Last Lipids were near goal except elevated Trig's :  Lab Results  Component Value Date   CHOL 110 01/21/2023   HDL 26 (L) 01/21/2023   LDLCALC 58 01/21/2023   TRIG 190 (H) 01/21/2023   CHOLHDL 4.2 01/21/2023       Also, the patient has history of  Insulin Dependent T2_DM (2004)w/CKD2.  In Feb 2020, patient was started on Novolin 70/30.  In Nov 2020 (weight was 250# / BMI 34.45) ,  he was started on Ozempic - later switched to [facility] & he's off Insulin & weighs 225# today !     Patient has had no symptoms of reactive hypoglycemia, diabetic polys or visual blurring, but he does have painful burning dysthesias of the distal LE's are improved. .   Last A1c was not at goal :  Lab Results  Component Value Date   HGBA1C 6.2 (H) 01/21/2023                                                          Further, the patient also has history of Vitamin D Deficiency ("15" /2009) and supplements vitamin D without any suspected side-effects. Last vitamin D was at goal :  Lab Results  Component Value Date   VD25OH 79 10/25/2021        Current Outpatient Medications on File Prior to Visit  Medication Sig   aspirin 81 MG tablet Take daily.   atenolol 100 MG tablet Take  1 tablet  Daily for BP    VITAMIN D 2000 u Take 4 capsules daily.    Cinnamon 500 MG caps Take 4 times daily.   B-12 2500 MCG SL Place 1 tablet under the tongue daily.   ezetimibe  10 MG tablet Take 1 tablet daily.   fenofibrate 160 MG tablet Take 1 tablet Daily    glipiZIDE 5 MG tablet Take 1 tablet 3 x /day with Meals   ATROVENT  0.06 % nasal spray Use 1 to 2 sprays each nostril 2 to 3 x /day as needed   metFORMIN -XR 500 MG  Take  2 tablets 2 x /Day   olmesartan 40 MG tablet Take 1/2 to 1 tablet at night for BP   Novolin N 70/30 -injects 35 units in the AM and 30 units in the PM.   OZEMPIC, 1 MG/DOSE, 4 MG/3ML  INJECT 1 MG INTO THE SKIN ONCE A WEEK   pregabalin (LYRICA) 100 MG capsule TAKE 1 CAPSULE TWICE DAILY    traZODone 150 MG tablet 1/2-1 tablet at night as needed for sleep     Allergies  Allergen Reactions   Phentermine Other (See Comments)    Vision loss   Topamax [Topiramate] Other (See Comments)    Vision changes/loss of vision   Zocor [Simvastatin]     Headache   Zoloft [Sertraline Hcl]     dysphona     PMHx:   Past Medical History:  Diagnosis Date   Asthma    Diabetes mellitus without complication (HCC)    Fatty liver disease, nonalcoholic    GERD (gastroesophageal reflux disease)    Gout    Hyperlipidemia    Hypertension    Hypogonadism male    Obesity    Venous insufficiency      Immunization History  Administered Date(s) Administered   PPD Test 06/21/2018, 07/27/2019, 08/27/2020   Pneumococcal - 23 07/27/2019   Pneumococcal - 23 02/09/2013   Tdap 02/09/2013     Past Surgical History:  Procedure Laterality Date   ANTERIOR CRUCIATE LIGAMENT REPAIR Left 2001   CYSTOSCOPY  1993   ESOPHAGOGASTRODUODENOSCOPY  1995, 2002   gastritis, esophagititis   KNEE ARTHROSCOPY Right 2002, 1995    FHx:     Reviewed / unchanged  SHx:    Reviewed / unchanged   Systems Review:  Constitutional: Denies fever, chills, wt changes, headaches, insomnia, fatigue, night sweats, change in appetite. Eyes: Denies redness, blurred vision, diplopia, discharge, itchy, watery eyes.  ENT: Denies discharge, congestion, post nasal drip, epistaxis, sore throat, earache, hearing loss, dental pain, tinnitus, vertigo, sinus pain, snoring.  CV: Denies chest pain, palpitations, irregular heartbeat, syncope, dyspnea, diaphoresis, orthopnea, PND, claudication or edema. Respiratory: denies cough, dyspnea, DOE, pleurisy, hoarseness, laryngitis, wheezing.  Gastrointestinal: Denies dysphagia, odynophagia, heartburn, reflux, water brash, abdominal pain or cramps, nausea, vomiting, bloating, diarrhea, constipation, hematemesis, melena, hematochezia  or hemorrhoids. Genitourinary: Denies dysuria, frequency, urgency, nocturia, hesitancy, discharge, hematuria or flank pain. Musculoskeletal: Denies arthralgias, myalgias, stiffness, jt. swelling, pain, limping or strain/sprain.  Skin: Denies pruritus, rash, hives, warts, acne, eczema or change in skin lesion(s). Neuro: No weakness, tremor, incoordination, spasms, paresthesia or pain. Psychiatric: Denies confusion, memory loss or sensory loss. Endo: Denies change in weight, skin or hair change.  Heme/Lymph: No excessive bleeding, bruising or enlarged lymph nodes.  Physical Exam  BP 132/76   Pulse 88   Temp 97.9 F (36.6 C)   Resp 17   Ht 5\' 10"  (1.778 m)   Wt 225 lb 6.4 oz (102.2 kg)   SpO2 98%   BMI 32.34 kg/m   Appears  over nourished, well groomed  and in no distress.  Eyes: PERRLA, EOMs, conjunctiva no swelling or erythema. Sinuses: No frontal/maxillary tenderness ENT/Mouth: EAC's clear, TM's nl w/o erythema, bulging. Nares clear w/o erythema, swelling, exudates. Oropharynx clear without erythema or exudates. Oral hygiene is good. Tongue normal, non obstructing.  Hearing intact.  Neck: Supple. Thyroid not palpable. Car 2+/2+ without bruits, nodes or JVD. Chest: Respirations nl with BS clear & equal w/o rales, rhonchi, wheezing or stridor.  Cor: Heart sounds normal w/ regular  rate and rhythm without sig. murmurs, gallops, clicks or rubs. Peripheral pulses normal and equal  without edema.  Abdomen: Soft & bowel sounds normal. Non-tender w/o guarding, rebound, hernias, masses or organomegaly.  Lymphatics: Unremarkable.  Musculoskeletal: Full ROM all peripheral extremities, joint stability, 5/5 strength and normal gait.  Skin: Warm, dry without exposed rashes, lesions or ecchymosis apparent.  Neuro: Cranial nerves intact, reflexes equal bilaterally. Sensory-motor testing grossly intact. Tendon reflexes grossly intact.  Pysch: Alert & oriented x 3.  Insight and judgement nl & appropriate. No ideations.  Assessment and Plan:  1. Essential hypertension  - Continue medication, monitor blood pressure at home.  - Continue DASH diet.  Reminder to go to the ER if any CP,  SOB, nausea, dizziness, severe HA, changes vision/speech.     - CBC with Differential/Platelet - COMPLETE METABOLIC PANEL WITH GFR - Magnesium - TSH  2. Hyperlipidemia associated with type 2 diabetes mellitus (HCC)  - Continue diet/meds, exercise,& lifestyle modifications.  - Continue monitor periodic cholesterol/liver & renal functions      - Lipid panel - TSH  3. Type 2 diabetes mellitus with stage 2 chronic kidney  disease, with long-term current use of insulin (HCC)  - Continue diet, exercise  - Lifestyle modifications.  - Monitor appropriate labs    - Hemoglobin A1c  4. Diabetic polyneuropathy associated with type 2 diabetes mellitus (HCC)  - Hemoglobin A1c  5. Vitamin D deficiency  - Continue supplementation.   - VITAMIN D 25 Hydroxy   6. Idiopathic gout  - Uric acid  7. Medication management  - CBC with Differential/Platelet - COMPLETE METABOLIC PANEL  WITH GFR - Magnesium - Lipid panel - TSH - Hemoglobin A1c - VITAMIN D 25 Hydroxy  - Uric acid        Discussed  regular exercise, BP monitoring, weight control to achieve/maintain BMI less than 25 and discussed med and SE's. Recommended labs to assess and monitor clinical status with further disposition pending results of labs.  I discussed the assessment and treatment plan with the patient. The patient was provided an opportunity to ask questions and all were answered. The patient agreed with the plan and demonstrated an understanding of the instructions.  I provided over 30 minutes of exam, counseling, chart review and  complex critical decision making.        The patient was advised to call back or seek an in-person evaluation if the symptoms worsen or if the condition fails to improve as anticipated.   Marinus Maw, MD

## 2023-04-23 NOTE — Patient Instructions (Signed)

## 2023-04-24 LAB — COMPLETE METABOLIC PANEL WITH GFR
AG Ratio: 1.9 (calc) (ref 1.0–2.5)
ALT: 22 U/L (ref 9–46)
AST: 18 U/L (ref 10–35)
Albumin: 4.4 g/dL (ref 3.6–5.1)
Alkaline phosphatase (APISO): 40 U/L (ref 35–144)
BUN: 21 mg/dL (ref 7–25)
CO2: 26 mmol/L (ref 20–32)
Calcium: 10.5 mg/dL — ABNORMAL HIGH (ref 8.6–10.3)
Chloride: 105 mmol/L (ref 98–110)
Creat: 1.01 mg/dL (ref 0.70–1.30)
Globulin: 2.3 g/dL (calc) (ref 1.9–3.7)
Glucose, Bld: 146 mg/dL — ABNORMAL HIGH (ref 65–99)
Potassium: 4.7 mmol/L (ref 3.5–5.3)
Sodium: 139 mmol/L (ref 135–146)
Total Bilirubin: 0.6 mg/dL (ref 0.2–1.2)
Total Protein: 6.7 g/dL (ref 6.1–8.1)
eGFR: 87 mL/min/{1.73_m2} (ref >=60)

## 2023-04-24 LAB — LIPID PANEL
Cholesterol: 121 mg/dL (ref <200)
HDL: 24 mg/dL — ABNORMAL LOW (ref >=40)
LDL Cholesterol (Calc): 67 mg/dL (calc)
Non-HDL Cholesterol (Calc): 97 mg/dL (calc) (ref <130)
Total CHOL/HDL Ratio: 5 (calc) — ABNORMAL HIGH (ref <5.0)
Triglycerides: 239 mg/dL — ABNORMAL HIGH (ref <150)

## 2023-04-24 LAB — CBC WITH DIFFERENTIAL/PLATELET
Absolute Monocytes: 307 cells/uL (ref 200–950)
Basophils Absolute: 21 cells/uL (ref 0–200)
Basophils Relative: 0.4 %
Eosinophils Absolute: 151 cells/uL (ref 15–500)
Eosinophils Relative: 2.9 %
HCT: 41.8 % (ref 38.5–50.0)
Hemoglobin: 13.7 g/dL (ref 13.2–17.1)
Lymphs Abs: 2418 cells/uL (ref 850–3900)
MCH: 28.1 pg (ref 27.0–33.0)
MCHC: 32.8 g/dL (ref 32.0–36.0)
MCV: 85.8 fL (ref 80.0–100.0)
MPV: 10.3 fL (ref 7.5–12.5)
Monocytes Relative: 5.9 %
Neutro Abs: 2304 cells/uL (ref 1500–7800)
Neutrophils Relative %: 44.3 %
Platelets: 181 10*3/uL (ref 140–400)
RBC: 4.87 10*6/uL (ref 4.20–5.80)
RDW: 13.2 % (ref 11.0–15.0)
Total Lymphocyte: 46.5 %
WBC: 5.2 10*3/uL (ref 3.8–10.8)

## 2023-04-24 LAB — HEMOGLOBIN A1C
Hgb A1c MFr Bld: 7.3 % of total Hgb — ABNORMAL HIGH (ref <5.7)
Mean Plasma Glucose: 163 mg/dL
eAG (mmol/L): 9 mmol/L

## 2023-04-24 LAB — URIC ACID: Uric Acid, Serum: 5.6 mg/dL (ref 4.0–8.0)

## 2023-04-24 LAB — VITAMIN D 25 HYDROXY (VIT D DEFICIENCY, FRACTURES): Vit D, 25-Hydroxy: 72 ng/mL (ref 30–100)

## 2023-04-24 LAB — TSH: TSH: 1.58 mIU/L (ref 0.40–4.50)

## 2023-04-24 LAB — MAGNESIUM: Magnesium: 1.6 mg/dL (ref 1.5–2.5)

## 2023-04-25 NOTE — Progress Notes (Signed)
<>*<>*<>*<>*<>*<>*<>*<>*<>*<>*<>*<>*<>*<>*<>*<>*<>*<>*<>*<>*<>*<>*<>*<>*<> <>*<>*<>*<>*<>*<>*<>*<>*<>*<>*<>*<>*<>*<>*<>*<>*<>*<>*<>*<>*<>*<>*<>*<>*<>  -Test results slightly outside the reference range are not unusual. If there is anything important, I will review this with you,  otherwise it is considered normal test values.  If you have further questions,  please do not hesitate to contact me at the office or via My Chart.   <>*<>*<>*<>*<>*<>*<>*<>*<>*<>*<>*<>*<>*<>*<>*<>*<>*<>*<>*<>*<>*<>*<>*<>*<> <>*<>*<>*<>*<>*<>*<>*<>*<>*<>*<>*<>*<>*<>*<>*<>*<>*<>*<>*<>*<>*<>*<>*<>*<>  - Chol = 121  &  LDL = 67  - Wonderful   - But Triglycerides ( = 2389     ) or fats in blood are still too high                 (   Ideal or  Goal is less than 150  !  )    - Recommend avoid fried & greasy foods,  sweets / candy,   - Avoid white rice  (brown or wild rice or Quinoa is OK),   - Avoid white potatoes  (sweet potatoes are OK)   - Avoid anything made from white flour  - bagels, doughnuts, rolls, buns, biscuits, white and   wheat breads, pizza crust and traditional  pasta made of white flour & egg white  - (vegetarian pasta or spinach or wheat pasta is OK).    - Multi-grain bread is OK - like multi-grain flat bread or  sandwich thins.   - Avoid alcohol in excess.   - Exercise is also important.  (  Trig's should get better as lose more weight & "cure" your Diabetes  !  )  <>*<>*<>*<>*<>*<>*<>*<>*<>*<>*<>*<>*<>*<>*<>*<>*<>*<>*<>*<>*<>*<>*<>*<>*<> <>*<>*<>*<>*<>*<>*<>*<>*<>*<>*<>*<>*<>*<>*<>*<>*<>*<>*<>*<>*<>*<>*<>*<>*<>  -  A1c = 7.9% - is worse  ! ? ! ? !   Suggest also add cinnamon which will help lowe blood sugar   - Recommend "naturebell"  on [employer]                           [place] Cinnamon                             Dose is 2 capsules = 9,000 mg                              240 capsules  is 4 month supply for $16.95   is about $4 / month   ! =================================================================  - Also taking 1/2 ounce or a tablespoon of Apple cider vinegar  twice  2 x / day       Will help lower blood sugar  !  <>*<>*<>*<>*<>*<>*<>*<>*<>*<>*<>*<>*<>*<>*<>*<>*<>*<>*<>*<>*<>*<>*<>*<>*<> <>*<>*<>*<>*<>*<>*<>*<>*<>*<>*<>*<>*<>*<>*<>*<>*<>*<>*<>*<>*<>*<>*<>*<>*<>  -  Magnesium = 1.6  is   very  low -           goal is betw 2.0 - 2.5,   - So..............Marland Kitchen  Recommend that you take                                  Magnesium 500 mg tablet  3 x/ day with meals   - also important to eat lots of  leafy green vegetables   - spinach - Kale - collards - greens - okra - asparagus - broccoli - quinoa   - squash - almonds - black, red, white beans  -  peas - green beans  <>*<>*<>*<>*<>*<>*<>*<>*<>*<>*<>*<>*<>*<>*<>*<>*<>*<>*<>*<>*<>*<>*<>*<>*<> <>*<>*<>*<>*<>*<>*<>*<>*<>*<>*<>*<>*<>*<>*<>*<>*<>*<>*<>*<>*<>*<>*<>*<>*<>  -  Vitamin D = 72 - Excellent   - Please keep dosage same   <>*<>*<>*<>*<>*<>*<>*<>*<>*<>*<>*<>*<>*<>*<>*<>*<>*<>*<>*<>*<>*<>*<>*<>*<> <>*<>*<>*<>*<>*<>*<>*<>*<>*<>*<>*<>*<>*<>*<>*<>*<>*<>*<>*<>*<>*<>*<>*<>*<>  -  Uric Acid / Gout Test is Normal & OK - Please continue Allopurinol same    <>*<>*<>*<>*<>*<>*<>*<>*<>*<>*<>*<>*<>*<>*<>*<>*<>*<>*<>*<>*<>*<>*<>*<>*<> <>*<>*<>*<>*<>*<>*<>*<>*<>*<>*<>*<>*<>*<>*<>*<>*<>*<>*<>*<>*<>*<>*<>*<>*<>  - All Else - CBC - Kidneys - Electrolytes - Liver - Magnesium & Thyroid    - all  Normal / OK  <>*<>*<>*<>*<>*<>*<>*<>*<>*<>*<>*<>*<>*<>*<>*<>*<>*<>*<>*<>*<>*<>*<>*<>*<> <>*<>*<>*<>*<>*<>*<>*<>*<>*<>*<>*<>*<>*<>*<>*<>*<>*<>*<>*<>*<>*<>*<>*<>*<>

## 2023-04-28 ENCOUNTER — Other Ambulatory Visit: Payer: Self-pay | Admitting: Internal Medicine

## 2023-04-28 ENCOUNTER — Other Ambulatory Visit: Payer: Self-pay | Admitting: Nurse Practitioner

## 2023-04-28 DIAGNOSIS — E782 Mixed hyperlipidemia: Secondary | ICD-10-CM

## 2023-04-28 DIAGNOSIS — I10 Essential (primary) hypertension: Secondary | ICD-10-CM

## 2023-04-28 DIAGNOSIS — E1165 Type 2 diabetes mellitus with hyperglycemia: Secondary | ICD-10-CM

## 2023-04-29 ENCOUNTER — Ambulatory Visit: Payer: Managed Care, Other (non HMO) | Admitting: Internal Medicine

## 2023-07-08 ENCOUNTER — Other Ambulatory Visit: Payer: Self-pay | Admitting: Internal Medicine

## 2023-07-08 ENCOUNTER — Other Ambulatory Visit: Payer: Self-pay | Admitting: Nurse Practitioner

## 2023-07-08 DIAGNOSIS — E1165 Type 2 diabetes mellitus with hyperglycemia: Secondary | ICD-10-CM

## 2023-07-08 DIAGNOSIS — E782 Mixed hyperlipidemia: Secondary | ICD-10-CM

## 2023-07-08 DIAGNOSIS — I10 Essential (primary) hypertension: Secondary | ICD-10-CM

## 2023-07-08 MED ORDER — EZETIMIBE 10 MG PO TABS: ORAL_TABLET | ORAL | 0 refills | Status: AC

## 2023-07-08 MED ORDER — FENOFIBRATE 160 MG PO TABS: ORAL_TABLET | ORAL | 0 refills | Status: AC

## 2023-07-08 MED ORDER — ONDANSETRON HCL 8 MG PO TABS: ORAL_TABLET | ORAL | 0 refills | Status: AC

## 2023-07-08 MED ORDER — ATENOLOL 100 MG PO TABS
ORAL_TABLET | ORAL | 0 refills | Status: AC
Start: 2023-07-08 — End: ?

## 2023-07-08 MED ORDER — GLIPIZIDE 5 MG PO TABS: ORAL_TABLET | ORAL | 0 refills | Status: AC

## 2023-07-08 MED ORDER — TADALAFIL 20 MG PO TABS: ORAL_TABLET | ORAL | 0 refills | Status: AC

## 2023-07-16 ENCOUNTER — Other Ambulatory Visit: Payer: Self-pay | Admitting: Internal Medicine

## 2023-07-16 DIAGNOSIS — I10 Essential (primary) hypertension: Secondary | ICD-10-CM

## 2023-07-16 MED ORDER — AZITHROMYCIN 250 MG PO TABS: ORAL_TABLET | ORAL | 0 refills | Status: DC

## 2023-07-16 MED ORDER — OLMESARTAN MEDOXOMIL 40 MG PO TABS: ORAL_TABLET | ORAL | 3 refills | Status: AC

## 2023-07-16 MED ORDER — DEXAMETHASONE 2 MG PO TABS: ORAL_TABLET | ORAL | 0 refills | Status: DC

## 2023-08-03 ENCOUNTER — Ambulatory Visit (INDEPENDENT_AMBULATORY_CARE_PROVIDER_SITE_OTHER): Payer: Managed Care, Other (non HMO) | Admitting: Nurse Practitioner

## 2023-08-03 ENCOUNTER — Encounter: Payer: Self-pay | Admitting: Nurse Practitioner

## 2023-08-03 VITALS — BP 136/82 | HR 82 | Temp 97.4°F | Ht 70.0 in | Wt 224.2 lb

## 2023-08-03 DIAGNOSIS — Z79899 Other long term (current) drug therapy: Secondary | ICD-10-CM

## 2023-08-03 DIAGNOSIS — J4520 Mild intermittent asthma, uncomplicated: Secondary | ICD-10-CM

## 2023-08-03 DIAGNOSIS — E1122 Type 2 diabetes mellitus with diabetic chronic kidney disease: Secondary | ICD-10-CM | POA: Diagnosis not present

## 2023-08-03 DIAGNOSIS — E1142 Type 2 diabetes mellitus with diabetic polyneuropathy: Secondary | ICD-10-CM

## 2023-08-03 DIAGNOSIS — E785 Hyperlipidemia, unspecified: Secondary | ICD-10-CM | POA: Diagnosis not present

## 2023-08-03 DIAGNOSIS — E1169 Type 2 diabetes mellitus with other specified complication: Secondary | ICD-10-CM

## 2023-08-03 DIAGNOSIS — I10 Essential (primary) hypertension: Secondary | ICD-10-CM

## 2023-08-03 DIAGNOSIS — Z794 Long term (current) use of insulin: Secondary | ICD-10-CM

## 2023-08-03 DIAGNOSIS — E66811 Obesity, class 1: Secondary | ICD-10-CM

## 2023-08-03 DIAGNOSIS — K2100 Gastro-esophageal reflux disease with esophagitis, without bleeding: Secondary | ICD-10-CM

## 2023-08-03 DIAGNOSIS — E559 Vitamin D deficiency, unspecified: Secondary | ICD-10-CM

## 2023-08-03 DIAGNOSIS — M1000 Idiopathic gout, unspecified site: Secondary | ICD-10-CM

## 2023-08-03 DIAGNOSIS — N182 Chronic kidney disease, stage 2 (mild): Secondary | ICD-10-CM

## 2023-08-03 NOTE — Progress Notes (Signed)
FOLLOW UP  Assessment and Plan:   Hypertension Well controlled with current medications  Monitor blood pressure at home; patient to call if consistently greater than 130/80 Continue DASH diet.   Reminder to go to the ER if any CP, SOB, nausea, dizziness, severe HA, changes vision/speech, left arm numbness and tingling and jaw pain.  Cholesterol Currently at goal;  Continue low cholesterol diet and exercise.  Check lipid panel.   Diabetes/Polyneuropathy Continue medication - Glipizide, Metformin, Insulin PRN Continue Mounjaro  Education: Reviewed 'ABCs' of diabetes management  Discussed goals to be met and/or maintained include A1C (<7) Blood pressure (<130/80) Cholesterol (LDL <70) Continue Eye Exam yearly  Continue Dental Exam Q6 mo Discussed dietary recommendations Discussed Physical Activity recommendations Foot exam UTD Check A1C  Obesity with co morbidities Weight trending down. Discussed appropriate BMI Diet modification. Physical activity. Encouraged/praised to build confidence.  Vitamin D deficiency Recently at goal.  Continue supplement for goal of 60-100 Monitor Vitamin D levels  Medication Management All medications discussed and reviewed in full. All questions and concerns regarding medications addressed.    GERD Continue Prevacid PRN. No suspected reflux complications (Barret/stricture). Lifestyle modification:  wt loss, avoid meals 2-3h before bedtime. Consider eliminating food triggers:  chocolate, caffeine, EtOH, acid/spicy food.  Idiopathic gout No recent flare  Monitor Uric Acid levels Discussed low purine diet   Asthma Resolved - no longer need inhaler  Orders Placed This Encounter  Procedures   CBC with Differential/Platelet   COMPLETE METABOLIC PANEL WITH GFR   Lipid panel   Hemoglobin A1c   Notify office for further evaluation and treatment, questions or concerns if any reported s/s fail to improve.   The patient was advised  to call back or seek an in-person evaluation if any symptoms worsen or if the condition fails to improve as anticipated.   Further disposition pending results of labs. Discussed med's effects and SE's.    I discussed the assessment and treatment plan with the patient. The patient was provided an opportunity to ask questions and all were answered. The patient agreed with the plan and demonstrated an understanding of the instructions.  Discussed med's effects and SE's. Screening labs and tests as requested with regular follow-up as recommended.  I provided 30 minutes of face-to-face time during this encounter including counseling, chart review, and critical decision making was preformed.  Today's Plan of Care is based on a patient-centered health care approach known as shared decision making - the decisions, tests and treatments allow for patient preferences and values to be balanced with clinical evidence.     Future Appointments  Date Time Provider Department Center  11/02/2023  3:00 PM [name], MD [facility] None    ----------------------------------------------------------------------------------------------------------------------  HPI 57 y.o. male  presents for 3 month follow up on hypertension, cholesterol, diabetes, weight and vitamin D deficiency.   Overall he reports feeling well today.    He has no new concerns at this time.   His blood pressure has been controlled at home, today their BP is BP: 136/82   He does not workout. He denies chest pain, shortness of breath, dizziness.   He is on cholesterol medication Zetia and denies myalgias. His cholesterol is at goal. The cholesterol last visit was:   Lab Results  Component Value Date   CHOL 121 04/23/2023   HDL 24 (L) 04/23/2023   LDLCALC 67 04/23/2023   TRIG 239 (H) 04/23/2023   CHOLHDL 5.0 (H) 04/23/2023    He has been  working on diet and exercise for prediabetes, and denies polydipsia.  Now only taking  Metformin, Glipizide and Insulin PRN.  Continues Mounjar 7.5 mg weekly. Last A1C in the office was:  Lab Results  Component Value Date   HGBA1C 7.3 (H) 04/23/2023   Patient is on Vitamin D supplement.   Lab Results  Component Value Date   VD25OH 72 04/23/2023        Current Medications:  Current Outpatient Medications on File Prior to Visit  Medication Sig   aspirin 81 MG tablet Take 81 mg by mouth daily.   atenolol (TENORMIN) 100 MG tablet Take 1 tablet by mouth once daily for blood pressure   Blood Glucose Monitoring Suppl (CONTOUR NEXT MONITOR) w/Device KIT 1 Device by Does not apply route 3 (three) times daily.   Cholecalciferol (VITAMIN D) 2000 units CAPS Take 4 capsules by mouth daily.    Cinnamon 500 MG capsule Take 500 mg by mouth 4 (four) times daily.   ezetimibe (ZETIA) 10 MG tablet TAKE 1 TABLET BY MOUTH ONCE DAILY FOR CHOLESTEROL   fenofibrate 160 MG tablet Take 1 tablet by mouth once daily   glipiZIDE (GLUCOTROL) 5 MG tablet TAKE 1 TABLET BY MOUTH THREE TIMES DAILY WITH MEALS FOR DIABETES   glucose blood (CONTOUR NEXT TEST) test strip Test blood sugar  three times a day before meals   metFORMIN (GLUCOPHAGE-XR) 500 MG 24 hr tablet Take  2 tablets   2 x /day  with Meals  for Diabetes                                      /                     TAKE                              BY                                MOUTH (Patient taking differently: 2 (two) times daily with a meal.)   MICROLET LANCETS MISC USE TO TEST UP TO THREE TIMES DAILY AS DIRECTED   olmesartan (BENICAR) 40 MG tablet Take 1/2 to 1 tablet  at night for BP   ondansetron (ZOFRAN) 8 MG tablet Take 1 tablet 2 to 3 x /day for Nausea   pantoprazole (PROTONIX) 40 MG tablet Take  1 tablet 2 x / Day  to Prevent Indigestion & Heartburn                                                           /                                                                   TAKE  BY                                                  MOUTH TAKE 1 TO 2 TABLETS BY MOUTH ONCE DAILY TO  PREVENT  HEARTBURN  AND  INDIGESTION   tadalafil (CIALIS) 20 MG tablet TAKE 1/2 TO 1 (ONE-HALF TO ONE) TABLET BY MOUTH EVERY 2-3 DAYS AS NEEDED FOR XXXX   tamsulosin (FLOMAX) 0.4 MG CAPS capsule Take 1 tablet at Bedtime for Prostate   tirzepatide (MOUNJARO) 10 MG/0.5ML Pen Inject 1 pen ( 10 mg ) into Skin every 7 days   ( Dx:  e11.29)   azithromycin (ZITHROMAX) 250 MG tablet Take 2 tablets with Food on  Day 1, then 1 tablet Daily with Food for Sinusitis / Bronchitis   dexamethasone (DECADRON) 2 MG tablet Take 1 tab 3 x / day for 2 days,      then 2 x / day for 2  Days,     then 1 tab daily   No current facility-administered medications on file prior to visit.     Allergies:  Allergies  Allergen Reactions   Phentermine Other (See Comments)    Vision loss   Topamax [Topiramate] Other (See Comments)    Vision changes/loss of vision   Zocor [Simvastatin]     Headache   Zoloft [Sertraline Hcl]     dysphona     Medical History:  Past Medical History:  Diagnosis Date   Asthma    Diabetes mellitus without complication (HCC)    Fatty liver disease, nonalcoholic    GERD (gastroesophageal reflux disease)    Gout    Hyperlipidemia    Hypertension    Hypogonadism male    Obesity    Venous insufficiency    Family history- Reviewed and unchanged Social history- Reviewed and unchanged   Review of Systems: A complete ROS was performed with pertinent positives/negatives noted in the HPI. The remainder of the ROS are negative.  Physical Exam: BP 136/82   Pulse 82   Temp (!) 97.4 F (36.3 C)   SpO2 98%  Wt Readings from Last 3 Encounters:  04/23/23 225 lb 6.4 oz (102.2 kg)  01/21/23 227 lb 3.2 oz (103.1 kg)  10/08/22 239 lb 12.8 oz (108.8 kg)   General Appearance: Well nourished, in no apparent distress. Eyes: PERRLA, EOMs, conjunctiva no swelling or erythema Sinuses: No Frontal/maxillary  tenderness ENT/Mouth: Ext aud canals clear, TMs without erythema, bulging. No erythema, swelling, or exudate on post pharynx.  Tonsils not swollen or erythematous. Hearing normal.  Neck: Supple, thyroid normal.  Respiratory: Respiratory effort normal, BS equal bilaterally without rales, rhonchi, wheezing or stridor.  Cardio: RRR with no MRGs. Brisk peripheral pulses without edema.  Abdomen: Soft, + BS.  Non tender, no guarding, rebound, hernias, masses. Lymphatics: Non tender without lymphadenopathy.  Musculoskeletal: Full ROM, 5/5 strength, Normal gait Skin: Warm, dry without rashes, lesions, ecchymosis.  Neuro: Cranial nerves intact. No cerebellar symptoms.  Psych: Awake and oriented X 3, normal affect, Insight and Judgment appropriate.    [name], NP 1:38 PM [facility]

## 2023-08-03 NOTE — Patient Instructions (Signed)

## 2023-08-04 ENCOUNTER — Other Ambulatory Visit (HOSPITAL_COMMUNITY): Payer: Self-pay

## 2023-08-04 LAB — CBC WITH DIFFERENTIAL/PLATELET
Absolute Lymphocytes: 2126 {cells}/uL (ref 850–3900)
Absolute Monocytes: 332 {cells}/uL (ref 200–950)
Basophils Absolute: 39 {cells}/uL (ref 0–200)
Basophils Relative: 0.6 %
Eosinophils Absolute: 1235 {cells}/uL — ABNORMAL HIGH (ref 15–500)
Eosinophils Relative: 19 %
HCT: 44.8 % (ref 38.5–50.0)
Hemoglobin: 14.9 g/dL (ref 13.2–17.1)
MCH: 28.2 pg (ref 27.0–33.0)
MCHC: 33.3 g/dL (ref 32.0–36.0)
MCV: 84.8 fL (ref 80.0–100.0)
MPV: 10.1 fL (ref 7.5–12.5)
Monocytes Relative: 5.1 %
Neutro Abs: 2769 {cells}/uL (ref 1500–7800)
Neutrophils Relative %: 42.6 %
Platelets: 201 10*3/uL (ref 140–400)
RBC: 5.28 10*6/uL (ref 4.20–5.80)
RDW: 12.9 % (ref 11.0–15.0)
Total Lymphocyte: 32.7 %
WBC: 6.5 10*3/uL (ref 3.8–10.8)

## 2023-08-04 LAB — COMPLETE METABOLIC PANEL WITH GFR
AG Ratio: 2 (calc) (ref 1.0–2.5)
ALT: 20 U/L (ref 9–46)
AST: 20 U/L (ref 10–35)
Albumin: 4.5 g/dL (ref 3.6–5.1)
Alkaline phosphatase (APISO): 41 U/L (ref 35–144)
BUN: 20 mg/dL (ref 7–25)
CO2: 29 mmol/L (ref 20–32)
Calcium: 10.6 mg/dL — ABNORMAL HIGH (ref 8.6–10.3)
Chloride: 101 mmol/L (ref 98–110)
Creat: 0.88 mg/dL (ref 0.70–1.30)
Globulin: 2.3 g/dL (ref 1.9–3.7)
Glucose, Bld: 153 mg/dL — ABNORMAL HIGH (ref 65–99)
Potassium: 5.4 mmol/L — ABNORMAL HIGH (ref 3.5–5.3)
Sodium: 137 mmol/L (ref 135–146)
Total Bilirubin: 0.5 mg/dL (ref 0.2–1.2)
Total Protein: 6.8 g/dL (ref 6.1–8.1)
eGFR: 100 mL/min/{1.73_m2} (ref >=60)

## 2023-08-04 LAB — LIPID PANEL
Cholesterol: 136 mg/dL (ref <200)
HDL: 30 mg/dL — ABNORMAL LOW (ref >=40)
LDL Cholesterol (Calc): 70 mg/dL
Non-HDL Cholesterol (Calc): 106 mg/dL (ref <130)
Total CHOL/HDL Ratio: 4.5 (calc) (ref <5.0)
Triglycerides: 272 mg/dL — ABNORMAL HIGH (ref <150)

## 2023-08-04 LAB — HEMOGLOBIN A1C
Hgb A1c MFr Bld: 6.7 %{Hb} — ABNORMAL HIGH (ref <5.7)
Mean Plasma Glucose: 146 mg/dL
eAG (mmol/L): 8.1 mmol/L

## 2023-08-06 ENCOUNTER — Other Ambulatory Visit: Payer: Self-pay | Admitting: Internal Medicine

## 2023-08-06 DIAGNOSIS — E119 Type 2 diabetes mellitus without complications: Secondary | ICD-10-CM

## 2023-08-06 MED ORDER — METFORMIN HCL ER 500 MG PO TB24: ORAL_TABLET | ORAL | 3 refills | Status: DC

## 2023-08-07 ENCOUNTER — Encounter: Payer: Self-pay | Admitting: Nurse Practitioner

## 2023-08-07 DIAGNOSIS — E119 Type 2 diabetes mellitus without complications: Secondary | ICD-10-CM

## 2023-08-09 MED ORDER — PANTOPRAZOLE SODIUM 40 MG PO TBEC: DELAYED_RELEASE_TABLET | ORAL | 3 refills | Status: AC

## 2023-08-09 MED ORDER — METFORMIN HCL ER 500 MG PO TB24: ORAL_TABLET | ORAL | 3 refills | Status: AC

## 2023-10-13 ENCOUNTER — Encounter: Payer: Managed Care, Other (non HMO) | Admitting: Internal Medicine

## 2023-11-02 ENCOUNTER — Encounter: Payer: Managed Care, Other (non HMO) | Admitting: Internal Medicine

## 2023-12-04 ENCOUNTER — Other Ambulatory Visit (HOSPITAL_COMMUNITY): Payer: Self-pay

## 2023-12-08 ENCOUNTER — Other Ambulatory Visit: Payer: Self-pay | Admitting: Internal Medicine

## 2023-12-08 ENCOUNTER — Other Ambulatory Visit (HOSPITAL_COMMUNITY): Payer: Self-pay

## 2023-12-08 DIAGNOSIS — N182 Chronic kidney disease, stage 2 (mild): Secondary | ICD-10-CM

## 2023-12-10 ENCOUNTER — Other Ambulatory Visit (HOSPITAL_COMMUNITY): Payer: Self-pay

## 2023-12-15 ENCOUNTER — Other Ambulatory Visit (HOSPITAL_COMMUNITY): Payer: Self-pay

## 2023-12-15 ENCOUNTER — Other Ambulatory Visit: Payer: Self-pay | Admitting: Family

## 2023-12-15 DIAGNOSIS — E1122 Type 2 diabetes mellitus with diabetic chronic kidney disease: Secondary | ICD-10-CM

## 2023-12-16 ENCOUNTER — Other Ambulatory Visit (HOSPITAL_COMMUNITY): Payer: Self-pay

## 2023-12-18 ENCOUNTER — Other Ambulatory Visit (HOSPITAL_COMMUNITY): Payer: Self-pay

## 2023-12-21 ENCOUNTER — Other Ambulatory Visit (HOSPITAL_COMMUNITY): Payer: Self-pay

## 2023-12-21 MED ORDER — MOUNJARO 10 MG/0.5ML ~~LOC~~ SOAJ
10.0000 mg | SUBCUTANEOUS | 3 refills | Status: AC
  Filled 2023-12-21: qty 6, 84d supply, fill #0
  Filled 2024-03-14: qty 6, 84d supply, fill #1
  Filled 2024-06-06: qty 6, 84d supply, fill #2

## 2024-06-06 ENCOUNTER — Other Ambulatory Visit (HOSPITAL_COMMUNITY): Payer: Self-pay

## 2024-06-10 ENCOUNTER — Other Ambulatory Visit (HOSPITAL_COMMUNITY): Payer: Self-pay

## 2024-06-10 MED ORDER — MOUNJARO 15 MG/0.5ML ~~LOC~~ SOAJ
15.0000 mg | SUBCUTANEOUS | 3 refills | Status: AC
  Filled 2024-06-10: qty 6, 84d supply, fill #0
  Filled 2024-06-15: qty 2, 28d supply, fill #0
  Filled 2024-06-15 – 2024-08-29 (×3): qty 6, 84d supply, fill #0

## 2024-06-14 ENCOUNTER — Other Ambulatory Visit (HOSPITAL_COMMUNITY): Payer: Self-pay

## 2024-06-15 ENCOUNTER — Telehealth (HOSPITAL_COMMUNITY): Payer: Self-pay

## 2024-06-15 ENCOUNTER — Other Ambulatory Visit (HOSPITAL_COMMUNITY): Payer: Self-pay

## 2024-06-15 ENCOUNTER — Other Ambulatory Visit: Payer: Self-pay

## 2024-06-15 NOTE — Telephone Encounter (Signed)
 Pharmacy Patient Advocate Encounter  Thank you for your prior authorization request. This request is not from a [facility] provider. Our team manages PA requests only for [facility] providers. Please submit this request to [facility] so the external [facility] provider can complete this prior authorization.

## 2024-08-29 ENCOUNTER — Other Ambulatory Visit (HOSPITAL_COMMUNITY): Payer: Self-pay

## 2024-08-29 ENCOUNTER — Other Ambulatory Visit: Payer: Self-pay
